# Patient Record
Sex: Female | Born: 1945 | Race: White | Hispanic: No | Smoking: Never smoker
Health system: Southern US, Community
[De-identification: ages and names within clinical notes are randomized; demographics above are authoritative.]

## PROBLEM LIST (undated history)

## (undated) DIAGNOSIS — M8888 Osteitis deformans of other bones: Secondary | ICD-10-CM

## (undated) DIAGNOSIS — G43909 Migraine, unspecified, not intractable, without status migrainosus: Secondary | ICD-10-CM

## (undated) DIAGNOSIS — K219 Gastro-esophageal reflux disease without esophagitis: Secondary | ICD-10-CM

## (undated) DIAGNOSIS — B3781 Candidal esophagitis: Secondary | ICD-10-CM

## (undated) DIAGNOSIS — Z8619 Personal history of other infectious and parasitic diseases: Secondary | ICD-10-CM

## (undated) DIAGNOSIS — K635 Polyp of colon: Secondary | ICD-10-CM

## (undated) DIAGNOSIS — Z8719 Personal history of other diseases of the digestive system: Secondary | ICD-10-CM

## (undated) DIAGNOSIS — T7840XA Allergy, unspecified, initial encounter: Secondary | ICD-10-CM

## (undated) DIAGNOSIS — E039 Hypothyroidism, unspecified: Secondary | ICD-10-CM

## (undated) DIAGNOSIS — E079 Disorder of thyroid, unspecified: Secondary | ICD-10-CM

## (undated) DIAGNOSIS — K5792 Diverticulitis of intestine, part unspecified, without perforation or abscess without bleeding: Secondary | ICD-10-CM

## (undated) DIAGNOSIS — I880 Nonspecific mesenteric lymphadenitis: Secondary | ICD-10-CM

## (undated) HISTORY — PX: COLONOSCOPY: SHX174

## (undated) HISTORY — DX: Personal history of other diseases of the digestive system: Z87.19

## (undated) HISTORY — DX: Allergy, unspecified, initial encounter: T78.40XA

## (undated) HISTORY — DX: Polyp of colon: K63.5

## (undated) HISTORY — DX: Migraine, unspecified, not intractable, without status migrainosus: G43.909

## (undated) HISTORY — PX: KIDNEY CYST REMOVAL: SHX684

## (undated) HISTORY — DX: Gastro-esophageal reflux disease without esophagitis: K21.9

## (undated) HISTORY — PX: BREAST CYST ASPIRATION: SHX578

## (undated) HISTORY — DX: Osteitis deformans of other bones: M88.88

## (undated) HISTORY — DX: Candidal esophagitis: B37.81

## (undated) HISTORY — DX: Personal history of other infectious and parasitic diseases: Z86.19

## (undated) HISTORY — DX: Diverticulitis of intestine, part unspecified, without perforation or abscess without bleeding: K57.92

## (undated) HISTORY — DX: Nonspecific mesenteric lymphadenitis: I88.0

## (undated) HISTORY — DX: Hypothyroidism, unspecified: E03.9

---
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
## 1974-07-19 HISTORY — PX: BREAST BIOPSY: SHX20

## 1995-07-20 DIAGNOSIS — E039 Hypothyroidism, unspecified: Secondary | ICD-10-CM | POA: Insufficient documentation

## 1998-07-19 HISTORY — PX: VAGINAL HYSTERECTOMY: SUR661

## 2003-07-20 HISTORY — PX: REDUCTION MAMMAPLASTY: SUR839

## 2013-07-19 HISTORY — PX: CHOLECYSTECTOMY: SHX55

## 2014-03-21 DIAGNOSIS — G500 Trigeminal neuralgia: Secondary | ICD-10-CM | POA: Insufficient documentation

## 2017-07-19 HISTORY — PX: CYST EXCISION: SHX5701

## 2019-07-14 ENCOUNTER — Other Ambulatory Visit: Payer: Self-pay

## 2019-07-14 ENCOUNTER — Ambulatory Visit (HOSPITAL_COMMUNITY)
Admission: EM | Admit: 2019-07-14 | Discharge: 2019-07-14 | Disposition: A | Discharge status: Home / Self Care | Payer: Medicare Other

## 2019-07-14 ENCOUNTER — Encounter (HOSPITAL_COMMUNITY): Payer: Self-pay | Admitting: Emergency Medicine

## 2019-07-14 DIAGNOSIS — B029 Zoster without complications: Secondary | ICD-10-CM

## 2019-07-14 HISTORY — DX: Disorder of thyroid, unspecified: E07.9

## 2019-07-14 MED ORDER — ACYCLOVIR 400 MG PO TABS: 800.0000 mg | ORAL_TABLET | Freq: Every day | ORAL | 0 refills | Status: AC

## 2019-07-14 MED ORDER — ONDANSETRON 4 MG PO TBDP: 4.0000 mg | ORAL_TABLET | Freq: Three times a day (TID) | ORAL | 0 refills | Status: DC | PRN

## 2019-07-14 MED ORDER — PREDNISONE 10 MG (21) PO TBPK: ORAL_TABLET | ORAL | 0 refills | Status: DC

## 2019-07-14 NOTE — Discharge Instructions (Signed)
Take the medication as prescribed °Follow up as needed for continued or worsening symptoms ° °

## 2019-07-14 NOTE — ED Triage Notes (Signed)
Pain on right side of head, neck and right mid back, pain started 8 weeks ago.  Seen by pcp, treated with antibiotics, no relief.  Wednesday morning started having pain in head and neck, shooting pain, jabbing pain under bra line on right.    Patient is visiting from [place]

## 2019-07-15 NOTE — ED Provider Notes (Signed)
[place]    CSN: [id number] Arrival date & time: 07/14/19  1344      History   Chief Complaint Chief Complaint  Patient presents with  . Appointment    2:00pm  . Neck Pain    HPI Marylen Zuk is a 73 y.o. female.   Patient is a 73 year old female that presents today with right sided head, neck,scalp,  mid upper back pain.  This has been present and worsening over the past 8 weeks.  She has been treated with 2 rounds of antibiotics and prednisone for "sinus infection" .  This is not helped at all.  4 days ago she started having sharp, stabbing pain in head and neck and under her bra line on the right side.  She does have a past medical history of shingles and has been under a lot of stress recently due to husband's 1 year death anniversary.  No recorded fevers.  No visible rashes that she has seen.  No falls or injuries.  No dizziness or blurred vision.  ROS per HPI    Neck Pain   Past Medical History:  Diagnosis Date  . Thyroid disease     There are no problems to display for this patient.   Past Surgical History:  Procedure Laterality Date  . ABDOMINAL HYSTERECTOMY    . CHOLECYSTECTOMY    . CYST EXCISION     kidney    OB History   No obstetric history on file.      Home Medications    Prior to Admission medications   Medication Sig Start Date End Date Taking? Authorizing Provider  levothyroxine (SYNTHROID) 25 MCG tablet Take 25 mcg by mouth daily before breakfast.   Yes [provider]  acyclovir (ZOVIRAX) 400 MG tablet Take 2 tablets (800 mg total) by mouth 5 (five) times daily for 7 days. 07/14/19 07/21/19  Loura Halt A, NP  ondansetron (ZOFRAN ODT) 4 MG disintegrating tablet Take 1 tablet (4 mg total) by mouth every 8 (eight) hours as needed for nausea or vomiting. 07/14/19   Loura Halt A, NP  predniSONE (STERAPRED UNI-PAK 21 TAB) 10 MG (21) TBPK tablet 6 tabs for 1 day, then 5 tabs for 1 das, then 4 tabs for 1 day, then 3 tabs  for 1 day, 2 tabs for 1 day, then 1 tab for 1 day 07/14/19   Orvan July, NP    Family History Family History  Problem Relation Age of Onset  . Lymphoma Father   . Cancer Brother     Social History Social History   Tobacco Use  . Smoking status: Never Smoker  Substance Use Topics  . Alcohol use: Not Currently  . Drug use: Never     Allergies   Sulfamethoxazole-trimethoprim   Review of Systems Review of Systems  Musculoskeletal: Positive for neck pain.     Physical Exam Triage Vital Signs ED Triage Vitals  Enc Vitals Group     BP 07/14/19 1429 (!) 142/91     Pulse Rate 07/14/19 1429 90     Resp 07/14/19 1429 16     Temp 07/14/19 1429 98.4 F (36.9 C)     Temp Source 07/14/19 1429 Oral     SpO2 07/14/19 1429 98 %     Weight --      Height --      Head Circumference --      Peak Flow --      Pain Score 07/14/19 1432  6     Pain Loc --      Pain Edu? --      Excl. in GC? --    No data found.  Updated Vital Signs BP (!) 142/91 (BP Location: Left Arm)   Pulse 90   Temp 98.4 F (36.9 C) (Oral)   Resp 16   SpO2 98%   Visual Acuity Right Eye Distance:   Left Eye Distance:   Bilateral Distance:    Right Eye Near:   Left Eye Near:    Bilateral Near:     Physical Exam Vitals and nursing note reviewed.  Constitutional:      General: She is not in acute distress.    Appearance: Normal appearance. She is not ill-appearing, toxic-appearing or diaphoretic.  HENT:     Head: Normocephalic.     Nose: Nose normal.     Mouth/Throat:     Pharynx: Oropharynx is clear.  Eyes:     Conjunctiva/sclera: Conjunctivae normal.  Pulmonary:     Effort: Pulmonary effort is normal.  Abdominal:     Palpations: Abdomen is soft.     Tenderness: There is no abdominal tenderness.  Musculoskeletal:        General: Normal range of motion.     Cervical back: Normal range of motion.  Skin:    General: Skin is warm and dry.     Findings: Rash present. Rash is crusting  and macular. Rash is not pustular or vesicular.          Comments: Rash to right posterior scalp and right upper back near her bra line. Some crusting in the scalp.   Neurological:     Mental Status: She is alert.  Psychiatric:        Mood and Affect: Mood normal.      UC Treatments / Results  Labs (all labs ordered are listed, but only abnormal results are displayed) Labs Reviewed - No data to display  EKG   Radiology No results found.  Procedures Procedures (including critical care time)  Medications Ordered in UC Medications - No data to display  Initial Impression / Assessment and Plan / UC Course  I have reviewed the triage vital signs and the nursing notes.  Pertinent labs & imaging results that were available during my care of the patient were reviewed by me and considered in my medical decision making (see chart for details).     Shingles-most likely diagnosis based on rash and symptoms Hx of same and pt has been under a lot of stress.  We will treat with acyclovir and prednisone. Zofran for nausea.  Follow up as needed for continued or worsening symptoms  Final Clinical Impressions(s) / UC Diagnoses   Final diagnoses:  Herpes zoster without complication     Discharge Instructions     Take the medication as prescribed Follow up as needed for continued or worsening symptoms    ED Prescriptions    Medication Sig Dispense Auth. Provider   acyclovir (ZOVIRAX) 400 MG tablet Take 2 tablets (800 mg total) by mouth 5 (five) times daily for 7 days. 70 tablet Freddi Schrager A, NP   predniSONE (STERAPRED UNI-PAK 21 TAB) 10 MG (21) TBPK tablet 6 tabs for 1 day, then 5 tabs for 1 das, then 4 tabs for 1 day, then 3 tabs for 1 day, 2 tabs for 1 day, then 1 tab for 1 day 21 tablet Sulema Braid A, NP   ondansetron (ZOFRAN ODT) 4 MG  disintegrating tablet Take 1 tablet (4 mg total) by mouth every 8 (eight) hours as needed for nausea or vomiting. 20 tablet Dahlia ByesBast, Daven Montz A,  NP     PDMP not reviewed this encounter.   Janace ArisBast, Andriea Hasegawa A, NP 07/15/19 1444

## 2019-11-21 ENCOUNTER — Telehealth: Payer: Self-pay | Admitting: Family Medicine

## 2019-11-21 MED ORDER — LEVOTHYROXINE SODIUM 25 MCG PO TABS: 25.0000 ug | ORAL_TABLET | Freq: Every day | ORAL | 4 refills | Status: DC

## 2019-11-21 NOTE — Telephone Encounter (Signed)
Rx done. 

## 2019-11-21 NOTE — Telephone Encounter (Signed)
I am ok with refill to get her through to new patient appointment.

## 2019-11-21 NOTE — Telephone Encounter (Signed)
Pt has a new pt appt on August 27th at 2:30 pm. Pt states she moved here recently and wants to be establish with [facility], however her old PCP would only fill her synthroid for this month and next but she will be out before she can see [facility]. Pt is wondering when it comes time for her next refill in July if [facility] will be able to fill it until she is able to be seen Aug 27th? Pt states this is the only medication she takes orally. Informed pt that I cannot guarantee that the PCP will be willing or able to do so but pt wanted me to send the message back to see if possible.  Medication:   synthroid 0.058mcg lowest dosage   Pharmacy: [facility] [address]: [phone]    Pt can be reached at [phone]

## 2020-03-14 ENCOUNTER — Ambulatory Visit (INDEPENDENT_AMBULATORY_CARE_PROVIDER_SITE_OTHER): Payer: Medicare PPO | Admitting: Family Medicine

## 2020-03-14 ENCOUNTER — Encounter: Payer: Self-pay | Admitting: Family Medicine

## 2020-03-14 ENCOUNTER — Other Ambulatory Visit: Payer: Self-pay

## 2020-03-14 VITALS — BP 100/78 | HR 78 | Temp 98.2°F | Ht 65.0 in | Wt 135.1 lb

## 2020-03-14 DIAGNOSIS — J329 Chronic sinusitis, unspecified: Secondary | ICD-10-CM

## 2020-03-14 DIAGNOSIS — G43809 Other migraine, not intractable, without status migrainosus: Secondary | ICD-10-CM

## 2020-03-14 DIAGNOSIS — E785 Hyperlipidemia, unspecified: Secondary | ICD-10-CM | POA: Diagnosis not present

## 2020-03-14 DIAGNOSIS — Z1231 Encounter for screening mammogram for malignant neoplasm of breast: Secondary | ICD-10-CM

## 2020-03-14 DIAGNOSIS — L989 Disorder of the skin and subcutaneous tissue, unspecified: Secondary | ICD-10-CM

## 2020-03-14 DIAGNOSIS — E2839 Other primary ovarian failure: Secondary | ICD-10-CM

## 2020-03-14 DIAGNOSIS — R197 Diarrhea, unspecified: Secondary | ICD-10-CM

## 2020-03-14 DIAGNOSIS — E538 Deficiency of other specified B group vitamins: Secondary | ICD-10-CM

## 2020-03-14 DIAGNOSIS — E039 Hypothyroidism, unspecified: Secondary | ICD-10-CM | POA: Diagnosis not present

## 2020-03-14 DIAGNOSIS — E559 Vitamin D deficiency, unspecified: Secondary | ICD-10-CM

## 2020-03-14 MED ORDER — UBRELVY 50 MG PO TABS: 50.0000 mg | ORAL_TABLET | Freq: Once | ORAL | 2 refills | Status: AC | PRN

## 2020-03-14 MED ORDER — PREDNISONE 20 MG PO TABS: ORAL_TABLET | ORAL | 0 refills | Status: DC

## 2020-03-14 NOTE — Progress Notes (Signed)
Alexa Rios DOB: 1945/08/26 Encounter date: 03/14/2020  This is a 74 y.o. female who presents to establish care. Chief Complaint  Patient presents with  . Establish Care    History of present illness: Husband passed in oct 2019; moved here to be near family.   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 Took gabapentin during that time. 100mg  capsule up to 2 capsules at bedtime. Saw dentist, saw ENT before getting diagnosis. Lasted 6-8 weeks. Usually managed on 200mg  qhs.   Also taking synthroid daily. Has been on this same dose for last ten years.   Has had complete hysterectomy at age 28. Had already gone through menopause and started having spotting. Gynecologist said there was breakthrough bleeding and also had cyst on ovary. Using estradiol 0.01%vag cream 42.5gm twice weekly.   Nystatin cream used for cracks in corners of mouth when they occur. 100,000 units 3-4 times daily prn.   Valtrex for fever blisters just if needed. Gets them 2-3 times/year.   Needs to establish with eye doc. Doesn't wear glasses, no cataracts. Wants to see derm. Wants ent referral - has a lot of sinus/allergy issues. Wants gastro.   Headaches as far back as she can remember in her family. Has migraines - can't take imitrex - gives her palpitations. Thrown up many times. Gets headaches regularly. True migraines once every couple of months. Worse after age 66. Knows when she is getting migraine because she gets different sensation in head. Last 2-3 hours up to 8 hours.   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 Saw doc at [facility] and they didn't think it was paniculitis. Dust from home renovations has aggravated sinuses. Ended up taking sudafed. Pharmacist suggested mucinex x 3 days. Really bothered her stomach. Since then has had diarrhea, burning in throat. 2 days ago started taking nexium to help.  Not helping yet. No blood in stools. No fevers. Drank pedialyte this morning.   mild osteopenia right hip on prior bone density.  Last physical was in 08/2019.    Past Medical History:  Diagnosis Date  . Diverticulitis   . GERD (gastroesophageal reflux disease)   . History of chicken pox   . Hypothyroidism   . Migraine   . Thyroid disease    Past Surgical History:  Procedure Laterality Date  . ABDOMINAL HYSTERECTOMY  2000   benign path  . BREAST BIOPSY Left 1976   benign per patient  . CHOLECYSTECTOMY  2015  . CYST EXCISION  2019   renal  . TONSILLECTOMY AND ADENOIDECTOMY  1951   Allergies  Allergen Reactions  . Sulfamethoxazole-Trimethoprim   . Sumatriptan     palpitations   Current Meds  Medication Sig  . BIOTIN 5000 PO Take by mouth.  09/2019 CALCIUM PO Take 1,200 mg by mouth daily.  2020 EVENING PRIMROSE OIL PO Take by mouth.  . Glucosamine-Chondroitin (COSAMIN DS PO) Take by mouth.  . levothyroxine (SYNTHROID) 25 MCG tablet Take 1 tablet (25 mcg total) by mouth daily before breakfast.  . Magnesium 200 MG TABS Take by mouth.  . Multiple Vitamin (MULTIVITAMIN ADULT PO) Take by mouth.  . Probiotic Product (PROBIOTIC PO) Take by mouth.  Marland Kitchen UBIQUINOL PO Take 50 mg by mouth daily.  . [DISCONTINUED] Estradiol 1 MG/GM GEL Place onto the skin once a week.   Social History   Tobacco Use  . Smoking status: Never Smoker  .  Smokeless tobacco: Never Used  Substance Use Topics  . Alcohol use: Not Currently   Family History  Problem Relation Age of Onset  . Rheum arthritis Mother   . Hyperlipidemia Mother   . Stroke Mother   . Lymphoma Father   . Diabetes Father   . Early death Father   . Hyperlipidemia Father   . Cancer Brother        ureteral  . Diabetes Brother   . Hyperlipidemia Brother   . Arthritis Sister   . Hyperlipidemia Sister   . Heart disease Maternal Grandmother   . High Cholesterol Maternal Grandmother   . Heart disease Paternal Grandmother   . High  Cholesterol Paternal Grandmother   . Prostate cancer Paternal Grandfather   . Stroke Paternal Grandfather      Review of Systems  Constitutional: Negative for chills, fatigue and fever.  HENT: Positive for congestion, sinus pressure and sore throat (post nasal drainage).   Respiratory: Negative for cough, chest tightness, shortness of breath and wheezing.   Cardiovascular: Negative for chest pain, palpitations and leg swelling.  Neurological: Positive for headaches.    Objective:  BP 100/78 (BP Location: Left Arm, Patient Position: Sitting, Cuff Size: Normal)   Pulse 78   Temp 98.2 F (36.8 C) (Oral)   Ht 5\' 5"  (1.651 m)   Wt 135 lb 1.6 oz (61.3 kg)   BMI 22.48 kg/m   Weight: 135 lb 1.6 oz (61.3 kg)   BP Readings from Last 3 Encounters:  03/14/20 100/78  07/14/19 (!) 142/91   Wt Readings from Last 3 Encounters:  03/14/20 135 lb 1.6 oz (61.3 kg)    Physical Exam Constitutional:      General: She is not in acute distress.    Appearance: She is well-developed.  HENT:     Right Ear: Tympanic membrane, ear canal and external ear normal.     Left Ear: Tympanic membrane, ear canal and external ear normal.     Nose: Septal deviation present.     Right Turbinates: Enlarged and swollen.     Left Turbinates: Enlarged and swollen.  Cardiovascular:     Rate and Rhythm: Normal rate and regular rhythm.     Heart sounds: Normal heart sounds. No murmur heard.  No friction rub.  Pulmonary:     Effort: Pulmonary effort is normal. No respiratory distress.     Breath sounds: Normal breath sounds. No wheezing or rales.  Abdominal:     General: Bowel sounds are normal.     Palpations: Abdomen is soft.  Musculoskeletal:     Right lower leg: No edema.     Left lower leg: No edema.  Neurological:     Mental Status: She is alert and oriented to person, place, and time.  Psychiatric:        Behavior: Behavior normal.     Assessment/Plan:  1. Chronic sinusitis, unspecified  location Increased pressure currently; has responded in past to prednisone; will give taper to help relieve current sx. Take with food to avoid stomach upset. - Ambulatory referral to ENT - Comprehensive metabolic panel; Future - Comprehensive metabolic panel  2. Other migraine without status migrainosus, not intractable Not tolerated sumatriptan in the past due to palpitations.  We will see if insurance will cover alternative medication for migraine abortant. - Ubrogepant (UBRELVY) 50 MG TABS; Take 50 mg by mouth once as needed for up to 1 dose.  Dispense: 30 tablet; Refill: 2  3. Hyperlipidemia - has  been diet controlled - Lipid panel; Future - Lipid panel  4. Hypothyroidism, unspecified type Continue with Synthroid.  Has been stable on 25 mcg dose for years. - TSH; Future - TSH  5. Diarrhea, unspecified type Chronic issue. - Ambulatory referral to Gastroenterology - CBC with Differential/Platelet; Future - CBC with Differential/Platelet  6. Vitamin B 12 deficiency - Vitamin B12; Future - Vitamin B12  7. Skin lesion Would like to follow with dermatology for regular skin checks. - Ambulatory referral to Dermatology  8. Encounter for screening mammogram for malignant neoplasm of breast - MM DIGITAL SCREENING BILATERAL; Future  9. Estrogen deficiency - DG Bone Density; Future  10. Vitamin D deficiency *check labs today.    Return for pending bloodwork. 25 minutes spent in total including exam, current condition/history reviewed, exam, charting.  Theodis Shove, MD

## 2020-03-14 NOTE — Patient Instructions (Addendum)
EYE: *Dr. Dione Booze: [phone] [address] [place] *Dr. Emily Filbert: [phone] (office just moved to [address], [place]) Dr. Fredrich Birks (OD) [facility]

## 2020-03-15 ENCOUNTER — Encounter: Payer: Self-pay | Admitting: Family Medicine

## 2020-03-15 ENCOUNTER — Other Ambulatory Visit: Payer: Self-pay | Admitting: Family Medicine

## 2020-03-15 DIAGNOSIS — G500 Trigeminal neuralgia: Secondary | ICD-10-CM | POA: Insufficient documentation

## 2020-03-15 DIAGNOSIS — J3089 Other allergic rhinitis: Secondary | ICD-10-CM | POA: Insufficient documentation

## 2020-03-15 DIAGNOSIS — G43109 Migraine with aura, not intractable, without status migrainosus: Secondary | ICD-10-CM | POA: Insufficient documentation

## 2020-03-15 DIAGNOSIS — K5790 Diverticulosis of intestine, part unspecified, without perforation or abscess without bleeding: Secondary | ICD-10-CM | POA: Insufficient documentation

## 2020-03-15 DIAGNOSIS — E039 Hypothyroidism, unspecified: Secondary | ICD-10-CM | POA: Insufficient documentation

## 2020-03-15 DIAGNOSIS — R12 Heartburn: Secondary | ICD-10-CM | POA: Insufficient documentation

## 2020-03-15 DIAGNOSIS — M5481 Occipital neuralgia: Secondary | ICD-10-CM | POA: Insufficient documentation

## 2020-03-15 LAB — CBC WITH DIFFERENTIAL/PLATELET
Absolute Monocytes: 554 cells/uL (ref 200–950)
Basophils Absolute: 32 cells/uL (ref 0–200)
Basophils Relative: 0.5 %
Eosinophils Absolute: 69 cells/uL (ref 15–500)
Eosinophils Relative: 1.1 %
HCT: 37.4 % (ref 35.0–45.0)
Hemoglobin: 12.8 g/dL (ref 11.7–15.5)
Lymphs Abs: 1852 cells/uL (ref 850–3900)
MCH: 32.5 pg (ref 27.0–33.0)
MCHC: 34.2 g/dL (ref 32.0–36.0)
MCV: 94.9 fL (ref 80.0–100.0)
MPV: 10.8 fL (ref 7.5–12.5)
Monocytes Relative: 8.8 %
Neutro Abs: 3793 cells/uL (ref 1500–7800)
Neutrophils Relative %: 60.2 %
Platelets: 247 10*3/uL (ref 140–400)
RBC: 3.94 10*6/uL (ref 3.80–5.10)
RDW: 11.6 % (ref 11.0–15.0)
Total Lymphocyte: 29.4 %
WBC: 6.3 10*3/uL (ref 3.8–10.8)

## 2020-03-15 LAB — COMPREHENSIVE METABOLIC PANEL
AG Ratio: 1.8 (calc) (ref 1.0–2.5)
ALT: 21 U/L (ref 6–29)
AST: 26 U/L (ref 10–35)
Albumin: 4.3 g/dL (ref 3.6–5.1)
Alkaline phosphatase (APISO): 88 U/L (ref 37–153)
BUN: 12 mg/dL (ref 7–25)
CO2: 27 mmol/L (ref 20–32)
Calcium: 9.1 mg/dL (ref 8.6–10.4)
Chloride: 101 mmol/L (ref 98–110)
Creat: 0.69 mg/dL (ref 0.60–0.93)
Globulin: 2.4 g/dL (calc) (ref 1.9–3.7)
Glucose, Bld: 81 mg/dL (ref 65–99)
Potassium: 4.5 mmol/L (ref 3.5–5.3)
Sodium: 137 mmol/L (ref 135–146)
Total Bilirubin: 0.4 mg/dL (ref 0.2–1.2)
Total Protein: 6.7 g/dL (ref 6.1–8.1)

## 2020-03-15 LAB — LIPID PANEL
Cholesterol: 192 mg/dL (ref <200)
HDL: 78 mg/dL (ref >=50)
LDL Cholesterol (Calc): 97 mg/dL (calc)
Non-HDL Cholesterol (Calc): 114 mg/dL (calc) (ref <130)
Total CHOL/HDL Ratio: 2.5 (calc) (ref <5.0)
Triglycerides: 79 mg/dL (ref <150)

## 2020-03-15 LAB — VITAMIN B12: Vitamin B-12: 439 pg/mL (ref 200–1100)

## 2020-03-15 LAB — VITAMIN D 25 HYDROXY (VIT D DEFICIENCY, FRACTURES): Vit D, 25-Hydroxy: 39 ng/mL (ref 30–100)

## 2020-03-15 LAB — TSH: TSH: 2.66 mIU/L (ref 0.40–4.50)

## 2020-03-15 MED ORDER — ESTRADIOL 1 MG/GM TD GEL: 1.0000 mg | TRANSDERMAL | Status: DC

## 2020-03-19 ENCOUNTER — Telehealth: Payer: Self-pay | Admitting: *Deleted

## 2020-03-19 NOTE — Telephone Encounter (Signed)
Prior auth for Ubrelvy 50mg  tablets sent to [email or web address].

## 2020-03-25 ENCOUNTER — Telehealth: Payer: Self-pay | Admitting: Family Medicine

## 2020-03-25 NOTE — Telephone Encounter (Signed)
Pt is calling in needing a refill of gabapentin 100 MG   Pharm:  [facility] [address]

## 2020-03-26 ENCOUNTER — Other Ambulatory Visit: Payer: Self-pay | Admitting: Family Medicine

## 2020-03-26 MED ORDER — GABAPENTIN 100 MG PO CAPS: 200.0000 mg | ORAL_CAPSULE | Freq: Every day | ORAL | 1 refills | Status: DC

## 2020-03-26 NOTE — Telephone Encounter (Signed)
Refill was sent. (This was discussed at visit but didn't make it to med list so I have added)

## 2020-03-26 NOTE — Telephone Encounter (Signed)
Noted  

## 2020-03-27 NOTE — Telephone Encounter (Signed)
Dr Hassan Rowan received a fax stating the Rx was not covered as the pt needs to try two or more medications.  PCP stated the pt is allergic to Sumatriptan so another triptan cannot be given and requested an appeal be sent.  Appeal form was completed with this information and faxed to [facility] at  [phone].

## 2020-03-31 ENCOUNTER — Telehealth: Payer: Self-pay | Admitting: Family Medicine

## 2020-03-31 NOTE — Telephone Encounter (Signed)
error 

## 2020-03-31 NOTE — Telephone Encounter (Signed)
Alexa Rios is calling to say they faxed over clinical records on 09/11. They need additional information for the pt to finish up the appeal.  Please advise

## 2020-04-08 ENCOUNTER — Telehealth: Payer: Self-pay | Admitting: Family Medicine

## 2020-04-08 NOTE — Telephone Encounter (Signed)
Pt call and stated she need a refill on Estradiol 1 MG/GM GEL she stated she need it now and pharmacy said it should be 1 mg ask needed and not 1 mg twice a wk . She stated the Insurance want pay. Pt want a call back.

## 2020-04-09 ENCOUNTER — Other Ambulatory Visit: Payer: Self-pay | Admitting: Family Medicine

## 2020-04-09 MED ORDER — ESTRADIOL 1 MG/GM TD GEL: TRANSDERMAL | 0 refills | Status: AC

## 2020-04-09 NOTE — Telephone Encounter (Signed)
Spoke with the pt and informed her of the message below.  Patient stated she used this twice a week, requested an early refill as the top of her tube was dry and she only has enough for one more dose and did not want to wait another 3 weeks.  Patient is aware the Rx was sent to the pharmacy with as needed added to sigs.

## 2020-04-09 NOTE — Telephone Encounter (Signed)
Ok to send refill, but uncertain why insurance won't pay with it written as twice weekly, since that is how it is approved for use. We typically cannot just say as needed, and have to be more specific? Please see how frequently she would like to use it and maybe we can be more specific about timing and then add as needed (ie three times weekly (typically max) as needed). Let me know if further questions.

## 2020-04-10 ENCOUNTER — Encounter: Payer: Self-pay | Admitting: Gastroenterology

## 2020-04-11 ENCOUNTER — Other Ambulatory Visit: Payer: Self-pay | Admitting: Otolaryngology

## 2020-04-11 ENCOUNTER — Other Ambulatory Visit: Payer: Self-pay

## 2020-04-11 ENCOUNTER — Ambulatory Visit
Admission: RE | Admit: 2020-04-11 | Discharge: 2020-04-11 | Disposition: A | Discharge status: Home / Self Care | Payer: Medicare PPO | Source: Ambulatory Visit | Attending: Family Medicine | Admitting: Family Medicine

## 2020-04-11 DIAGNOSIS — J329 Chronic sinusitis, unspecified: Secondary | ICD-10-CM

## 2020-04-11 DIAGNOSIS — Z1231 Encounter for screening mammogram for malignant neoplasm of breast: Secondary | ICD-10-CM

## 2020-04-11 NOTE — Telephone Encounter (Signed)
On the records they sent over (if for ubrelvy?) it looked like appeal was approved and she could get medication? I had given these back to you as I was not sure what was needed. If you don't have forms, then please ask for them to resend. I'm not sure what else they need and I don't have paperwork now.

## 2020-04-11 NOTE — Telephone Encounter (Signed)
Approval was faxed, sent to be scanned, pt is aware and she stated she picked up the Rx.

## 2020-04-15 ENCOUNTER — Telehealth: Payer: Self-pay | Admitting: *Deleted

## 2020-04-15 NOTE — Telephone Encounter (Signed)
Patient called and wanted to know the name of the podiatrist Dr Hassan Rowan recommended for acid treatment on her foot.  Patient requests a return call at [phone].  Message sent to PCP.

## 2020-04-16 NOTE — Telephone Encounter (Signed)
Dr. Ovid Curd  [facility]: [address] [phone]   Ok to put in referral if desired

## 2020-04-17 NOTE — Telephone Encounter (Signed)
Spoke with the pt and informed her of the information below as she stated she would prefer to call for the appt.

## 2020-04-24 ENCOUNTER — Ambulatory Visit
Admission: RE | Admit: 2020-04-24 | Discharge: 2020-04-24 | Disposition: A | Discharge status: Home / Self Care | Payer: Medicare PPO | Source: Ambulatory Visit | Attending: Otolaryngology | Admitting: Otolaryngology

## 2020-04-24 ENCOUNTER — Other Ambulatory Visit: Payer: Medicare PPO

## 2020-04-24 ENCOUNTER — Other Ambulatory Visit: Payer: Self-pay

## 2020-04-24 DIAGNOSIS — E1165 Type 2 diabetes mellitus with hyperglycemia: Secondary | ICD-10-CM

## 2020-04-24 DIAGNOSIS — J329 Chronic sinusitis, unspecified: Secondary | ICD-10-CM

## 2020-04-24 MED ORDER — FREESTYLE LIBRE 14 DAY READER DEVI: 5 refills | Status: DC

## 2020-04-24 NOTE — Progress Notes (Signed)
Libre glucose monitor to check sugars bid and prn for DM with hyperglycemia Change every 14 days   - per provider

## 2020-04-29 ENCOUNTER — Telehealth: Payer: Self-pay | Admitting: Family Medicine

## 2020-04-29 NOTE — Telephone Encounter (Signed)
Pt has has gastritis and reflux for about 3 weeks.  Pt has been taking Nexium and OTC gas products for 2 weeks-- gagging on the acid.  Pt is wondering what Dr Dorita Fray recommendation is.  Has an appt with Gastroenterologist on 11/30.

## 2020-04-30 NOTE — Telephone Encounter (Signed)
If [facility] available for virtual to discuss options that would be best. I know I don't have anything today. In general, elevating head of bed, cutting out acidic foods, nexium (can take 40mg ) 30 minutes prior to dinner and then add on pepcid morning and bedtime.

## 2020-04-30 NOTE — Telephone Encounter (Signed)
Spoke with the pt and informed her of the message below.  Appt scheduled with Dr Selena Batten at [facility] on 10/14.

## 2020-05-01 ENCOUNTER — Telehealth: Payer: Medicare PPO | Admitting: Family Medicine

## 2020-05-05 ENCOUNTER — Encounter: Payer: Self-pay | Admitting: Internal Medicine

## 2020-05-05 ENCOUNTER — Ambulatory Visit: Payer: Medicare PPO | Admitting: Internal Medicine

## 2020-05-05 VITALS — BP 110/78 | HR 88 | Ht 63.5 in | Wt 136.0 lb

## 2020-05-05 DIAGNOSIS — Z8619 Personal history of other infectious and parasitic diseases: Secondary | ICD-10-CM

## 2020-05-05 DIAGNOSIS — Z92241 Personal history of systemic steroid therapy: Secondary | ICD-10-CM

## 2020-05-05 DIAGNOSIS — R1310 Dysphagia, unspecified: Secondary | ICD-10-CM

## 2020-05-05 DIAGNOSIS — R12 Heartburn: Secondary | ICD-10-CM | POA: Diagnosis not present

## 2020-05-05 MED ORDER — FLUCONAZOLE 100 MG PO TABS: ORAL_TABLET | ORAL | 0 refills | Status: DC

## 2020-05-05 NOTE — Patient Instructions (Addendum)
We are going to obtain your records from [facility] for review.  We have sent the following medications to your pharmacy for you to pick up at your convenience: Diflucan  Please come back and see [place] on   I appreciate the opportunity to care for you. Stan Head, MD, [facility]

## 2020-05-05 NOTE — Progress Notes (Signed)
Alexa Rios 74 y.o. 1945/10/24 [id number]  Assessment & Plan:   Encounter Diagnoses  Name Primary?  Marland Kitchen Dysphagia, unspecified type Yes  . Heartburn   . History of candidiasis - esophagus   . History of steroid therapy    She has a mild dysphagia, and a lot of symptoms that are reminiscent of prior esophageal candidiasis she thinks.  Given that she had an EGD in April in [place] I do not feel compelled to repeat an upper endoscopy at this time.  She and I have decided empiric treatment with fluconazole given her onset of symptoms after antibiotics and prednisone therapy which certainly could lead to esophageal candidiasis.  I will review records from [facility] and follow-up with her she knows to contact me if this treatment is not successful.  We have planned a routine follow-up for December 6 as well.  I appreciate the opportunity to care for this patient. CC: Wynn Banker, MD   Subjective:   Chief Complaint: Heartburn abdominal discomfort gas bitter taste in mouth  HPI Alexa Rios is a 74 year old widowed white woman who recently moved to [facility] from the flat [facility] area and has been suffering with mild dysphagia (things move through slowly) burning chest and upper abdominal pain and a bitter taste in her mouth.  It is quite bothersome.  She has a  history of GI disturbances over the years and has followed with gastroenterologist wherever she has lived.  Most recently that was at [facility] in [place] and she had an EGD with esophageal dilation though no dysphagia reported in April just prior to moving here in May.  We do not have those records but we have requested them.  She moved here because she lost her husband in the last year or so and she decided to move closer to family.  Her son lives in this area.  She was having some renovations done in her recently purchased house and it was dusty and  she developed sinus problems and she had 2 rounds of antibiotics and 3 rounds of prednisone this summer.  After that she started to have gas, abdominal distention heartburn and epigastric discomfort.  She has tried Nexium and added Pepcid with some help and is a little bit better.  She went to the urgent care recently and a GI cocktail did soothe things for about a day.  However she still does not feel right and says this reminds her when she was diagnosed with esophageal candidiasis about a year or 2 ago.  She also had a flare of her trigeminal neuralgia during this time.  And took her gabapentin which may have upset her stomach some as well.  She takes MiraLAX daily to treat constipation with success overall.  A 2016 colonoscopy was okay she says she had a small polyp at age 26 on her first colonoscopy but none since.  She has a history of possible mesenteric adenitis and was referred to [facility] but was told she did not have that.  In any rate she has some chronic gastrointestinal symptoms off and on but this flare she described above is severe and has made it quite difficult to sleep at times and has been very bothersome though she does report she is about 10 or 20% improved at this point from the onset in the last month or so.  GI review of systems is otherwise negative other than also having some nausea symptoms.  She also  has a mild dry cough she reports. Allergies  Allergen Reactions  . Sulfamethoxazole-Trimethoprim   . Sumatriptan     palpitations   Current Meds  Medication Sig  . BIOTIN 5000 PO Take 1 tablet by mouth in the morning and at bedtime.   Marland Kitchen CALCIUM PO Take 1,200 mg by mouth daily.  . Continuous Blood Gluc Receiver (FREESTYLE LIBRE 14 DAY READER) DEVI Libre glucose monitor to check sugars bid and prn for DM with hyperglycemia  . Esomeprazole Magnesium (NEXIUM 24HR) 20 MG TBEC Take 1 tablet by mouth daily.  . Estradiol 1 MG/GM GEL Apply 1mg  twice a week as needed  . EVENING PRIMROSE  OIL PO Take 500 mg by mouth daily.   . Famotidine (PEPCID AC PO) Take 1 tablet by mouth daily.  gabapentin (NEURONTIN) 100 MG capsule Take 2 capsules (200 mg total) by mouth at bedtime. (Patient taking differently: Take 100 mg by mouth as needed. )  . Glucosamine-Chondroitin (COSAMIN DS PO) Take by mouth.  . levothyroxine (SYNTHROID) 25 MCG tablet Take 1 tablet (25 mcg total) by mouth daily before breakfast.  . Magnesium 200 MG TABS Take by mouth.  . Multiple Vitamin (MULTIVITAMIN ADULT PO) Take by mouth.  . Potassium 99 MG TABS Take 1 tablet by mouth daily.  . Probiotic Product (PROBIOTIC PO) Take by mouth.  . Simethicone (GAS-X PO) Take 1 tablet by mouth daily.  Marland Kitchen UBIQUINOL PO Take 50 mg by mouth daily.   Past Medical History:  Diagnosis Date  . Colon polyps   . Diverticulitis   . GERD (gastroesophageal reflux disease)   . History of chicken pox   . Hypothyroidism   . Mesenteric adenitis   . Migraine   . Status post dilation of esophageal narrowing   . Thyroid disease    Past Surgical History:  Procedure Laterality Date  . BREAST BIOPSY Left 1976   benign per patient  . BREAST CYST ASPIRATION Right    30 + yrs ago  . CHOLECYSTECTOMY  2015  . COLONOSCOPY    . CYST EXCISION  2019   renal  . REDUCTION MAMMAPLASTY Bilateral 2005  . TONSILLECTOMY AND ADENOIDECTOMY  1951  . VAGINAL HYSTERECTOMY  2000   benign path   Social History   Social History Narrative   Patient is widowed she is a retired 2006 and 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   Moved to [place] from [place] in May 2021   No alcohol   1 caffeinated beverage daily   Never smoker no drugs no other tobacco   family history includes Arthritis in her sister; Cancer in her brother; Diabetes in her brother and father; Early death in her father; Heart disease in her maternal grandmother and paternal grandmother; High Cholesterol in her maternal grandmother and paternal grandmother; Hyperlipidemia in her brother,  father, mother, and sister; Lymphoma in her father; Prostate cancer in her paternal grandfather; Rheum arthritis in her mother; Stroke in her mother and paternal grandfather.   Review of Systems As per HPI she has some hearing issues night sweats a slight cough lately and some headache issues at times.  All other review systems are negative.  Objective:   Physical Exam BP 110/78 (BP Location: Left Arm, Patient Position: Sitting, Cuff Size: Normal)   Pulse 88   Ht 5' 3.5" (1.613 m) Comment: height measured without shoes  Wt 136 lb (61.7 kg)   BMI 23.71 kg/m  Petite well-developed well-nourished white woman in no acute  distress Eyes are anicteric Mouth and posterior pharynx are free of lesions there is no thrush Lungs are clear Heart sounds are normal The abdomen is soft nontender no organomegaly or mass bowel sounds are present Appropriate mood and affect Alert and oriented x3

## 2020-05-06 ENCOUNTER — Telehealth: Payer: Self-pay | Admitting: Gastroenterology

## 2020-05-06 NOTE — Telephone Encounter (Signed)
Pt called Painful swallowing There are burning sensation in the chest.  Not spitting up.  No clinical signs or symptoms of food impaction. Took 2 doses of Diflucan.  Wants to EGD done as soon as possible as she is "miserable"  I have reviewed clinic note  from yesterday.  I also discussed with the patient in detail.  Advised her to start taking Mylanta before each meals. Can proceed with EGD with possible dil if needed (If OK with Jalene Mullet, If OK with Baldo Ash, Can set her for EGD with dil on 10/21 @ 3:30 PM at [facility]. Pl let pt know (if it is a go) RG

## 2020-05-07 ENCOUNTER — Other Ambulatory Visit: Payer: Self-pay | Admitting: Gastroenterology

## 2020-05-07 DIAGNOSIS — R1310 Dysphagia, unspecified: Secondary | ICD-10-CM

## 2020-05-07 DIAGNOSIS — Z8619 Personal history of other infectious and parasitic diseases: Secondary | ICD-10-CM

## 2020-05-07 NOTE — Telephone Encounter (Signed)
Spoke to patient this morning. She is scheduled for EGD with possible dilation 05/08/20 at 3:30 pm. We went over the instructions over the phone. She will also pick them up today at the [facility] office today as she will be up there to drop off records.  Thanks, [employer]

## 2020-05-07 NOTE — Telephone Encounter (Signed)
Please scope her  Thanks!

## 2020-05-08 ENCOUNTER — Ambulatory Visit: Payer: Medicare PPO | Admitting: Podiatry

## 2020-05-08 ENCOUNTER — Encounter: Payer: Self-pay | Admitting: Gastroenterology

## 2020-05-08 ENCOUNTER — Ambulatory Visit (AMBULATORY_SURGERY_CENTER): Payer: Medicare PPO | Admitting: Gastroenterology

## 2020-05-08 ENCOUNTER — Other Ambulatory Visit: Payer: Self-pay

## 2020-05-08 VITALS — BP 115/68 | HR 74 | Temp 96.6°F | Resp 12 | Ht 63.5 in | Wt 136.0 lb

## 2020-05-08 DIAGNOSIS — R1310 Dysphagia, unspecified: Secondary | ICD-10-CM | POA: Diagnosis not present

## 2020-05-08 DIAGNOSIS — K219 Gastro-esophageal reflux disease without esophagitis: Secondary | ICD-10-CM

## 2020-05-08 DIAGNOSIS — K449 Diaphragmatic hernia without obstruction or gangrene: Secondary | ICD-10-CM | POA: Diagnosis not present

## 2020-05-08 DIAGNOSIS — K2970 Gastritis, unspecified, without bleeding: Secondary | ICD-10-CM | POA: Diagnosis not present

## 2020-05-08 DIAGNOSIS — K2950 Unspecified chronic gastritis without bleeding: Secondary | ICD-10-CM | POA: Diagnosis not present

## 2020-05-08 MED ORDER — SODIUM CHLORIDE 0.9 % IV SOLN: 500.0000 mL | Freq: Once | INTRAVENOUS | Status: DC

## 2020-05-08 MED ORDER — FAMOTIDINE 20 MG PO TABS: 20.0000 mg | ORAL_TABLET | Freq: Every day | ORAL | 11 refills | Status: DC

## 2020-05-08 NOTE — Op Note (Signed)
[facility] Patient Name: Alexa Rios Procedure Date: 05/08/2020 2:58 PM MRN: [id number] Endoscopist: Lynann Bologna , MD Age: 74 Referring MD:  Date of Birth: 1946/05/25 Gender: Female Account #: [id number] Procedure:                Upper GI endoscopy Indications:              Dysphagia, GERD Medicines:                Monitored Anesthesia Care Procedure:                Pre-Anesthesia Assessment:                           - Prior to the procedure, a History and Physical                            was performed, and patient medications and                            allergies were reviewed. The patient's tolerance of                            previous anesthesia was also reviewed. The risks                            and benefits of the procedure and the sedation                            options and risks were discussed with the patient.                            All questions were answered, and informed consent                            was obtained. Prior Anticoagulants: The patient has                            taken no previous anticoagulant or antiplatelet                            agents. ASA Grade Assessment: II - A patient with                            mild systemic disease. After reviewing the risks                            and benefits, the patient was deemed in                            satisfactory condition to undergo the procedure.                           After obtaining informed consent, the endoscope was  passed under direct vision. Throughout the                            procedure, the patient's blood pressure, pulse, and                            oxygen saturations were monitored continuously. The                            Endoscope was introduced through the mouth, and                            advanced to the second part of duodenum. The upper                            GI endoscopy was accomplished without  difficulty.                            The patient tolerated the procedure well. Scope In: Scope Out: Findings:                 The distal esophagus was mildly tortuous but                            normal. Z-line was well-defined at 36 cm. Examined                            by NBI. Biopsies were obtained from the proximal,                            mid and distal esophagus with cold forceps for                            histology to r/o eosinophilic esophagitis. The                            scope was withdrawn. Dilation was performed with a                            Maloney dilator with mild resistance at 50 Fr and                            52 Fr.                           A small 2 cm hiatal hernia was present.                           The entire examined stomach was normal. Biopsies                            were taken with a cold forceps for histology from  antrum and body of the stomach.                           A 10 mm non-bleeding diverticulum was found in the                            second portion of the duodenum. Major papilla                            opened at the lower lip of the diverticulum. The                            remaining second portion of the duodenum was                            normal. Biopsies for histology were taken with a                            cold forceps to r/o celiac disease. Complications:            No immediate complications. Estimated Blood Loss:     Estimated blood loss: none. Impression:               - Mild presbyesophagus s/p esophageal dilatation.                           - Small hiatal hernia.                           - Incidental duodenal diverticulum. Recommendation:           - Patient has a contact number available for                            emergencies. The signs and symptoms of potential                            delayed complications were discussed with the                             patient. Return to normal activities tomorrow.                            Written discharge instructions were provided to the                            patient.                           - Post dilatation diet.                           - Continue Nexium 40 mg p.o. every morning 1/2hr                            before breakfast. Add Pepcid 20 mg  p.o. nightly.                           - Stop taking supplements including magnesium                            supplements x 2 weeks.                           - Can stop Diflucan as there was no evidence of                            Candida esophagitis.                           - Await pathology results.                           - The findings and recommendations were discussed                            with the patient's son Jillyn Hidden.                           - I do believe that there is some assoc esophageal                            hypersensitivity. If she still has problems, can                            consider trial of low-dose amitriptyline.                           - If still with problems, consider                           esophageal manometry/24-hour pH study.                           - FU with Dr Sharyl Nimrod clinic in 6-8 weeks. Lynann Bologna, MD 05/08/2020 3:49:17 PM This report has been signed electronically.

## 2020-05-08 NOTE — Progress Notes (Signed)
Called to room to assist during endoscopic procedure.  Patient ID and intended procedure confirmed with present staff. Received instructions for my participation in the procedure from the performing physician.  

## 2020-05-08 NOTE — Progress Notes (Signed)
A and O x3. Report to RN. Tolerated MAC anesthesia well.Teeth unchanged after procedure.

## 2020-05-08 NOTE — Patient Instructions (Addendum)
YOU HAD AN ENDOSCOPIC PROCEDURE TODAY AT THE [facility]:   Refer to the procedure report that was given to you for any specific questions about what was found during the examination.  If the procedure report does not answer your questions, please call your gastroenterologist to clarify.  If you requested that your care partner not be given the details of your procedure findings, then the procedure report has been included in a sealed envelope for you to review at your convenience later.  YOU SHOULD EXPECT: Some feelings of bloating in the abdomen. Passage of more gas than usual.  Walking can help get rid of the air that was put into your GI tract during the procedure and reduce the bloating. If you had a lower endoscopy (such as a colonoscopy or flexible sigmoidoscopy) you may notice spotting of blood in your stool or on the toilet paper. If you underwent a bowel prep for your procedure, you may not have a normal bowel movement for a few days.  Please Note:  You might notice some irritation and congestion in your nose or some drainage.  This is from the oxygen used during your procedure.  There is no need for concern and it should clear up in a day or so.  SYMPTOMS TO REPORT IMMEDIATELY:     Following upper endoscopy (EGD)  Vomiting of blood or coffee ground material  New chest pain or pain under the shoulder blades  Painful or persistently difficult swallowing  New shortness of breath  Fever of 100F or higher  Black, tarry-looking stools  For urgent or emergent issues, a gastroenterologist can be reached at any hour by calling [phone]. Do not use MyChart messaging for urgent concerns.    DIET:  Follow a Post-Dilation Diet (see handout given to you by your recovery nurse): Nothing by mouth until 4:30pm, Clear Liquids only from 4:30pm to 5:30pm, then you may have a Soft diet for the rest of the day today starting at 5:30pm. You may may proceed to your regular diet as  tolerated tomorrow morning.  Drink plenty of fluids but you should avoid alcoholic beverages for 24 hours.  MEDICATIONS: Continue Nexium 40 mg by mouth every morning 30 minutes before breakfast. Add Pepcid 20 mg by mouth nightly. Stop taking supplements, including magnesium supplements X 2 weeks. You may Stop Diflucan as there was no evidence of Candida esophagitis.  Please see handouts given to you by your recovery nurse.  Follow up: If you are still having problems, can consider a trial of low-dose amitriptyline. Also, may consider esophageal manometry/24-hour pH study.  Follow up with Dr. Sharyl Nimrod clinic in 6-8 weeks.    ACTIVITY:  You should plan to take it easy for the rest of today and you should NOT DRIVE or use heavy machinery until tomorrow (because of the sedation medicines used during the test).    FOLLOW UP: Our staff will call the number listed on your records 48-72 hours following your procedure to check on you and address any questions or concerns that you may have regarding the information given to you following your procedure. If we do not reach you, we will leave a message.  We will attempt to reach you two times.  During this call, we will ask if you have developed any symptoms of COVID 19. If you develop any symptoms (ie: fever, flu-like symptoms, shortness of breath, cough etc.) before then, please call [phone].  If you test positive for Covid 19 in  the 2 weeks post procedure, please call and report this information to [place].    If any biopsies were taken you will be contacted by phone or by letter within the next 1-3 weeks.  Please call us at [phone] if you have not heard about the biopsies in 3 weeks.   Thank you for allowing [place] to provide for your healthcare needs today.   SIGNATURES/CONFIDENTIALITY: You and/or your care partner have signed paperwork which will be entered into your electronic medical record.  These signatures attest to the fact that that  the information above on your After Visit Summary has been reviewed and is understood.  Full responsibility of the confidentiality of this discharge information lies with you and/or your care-partner.

## 2020-05-12 ENCOUNTER — Telehealth: Payer: Self-pay

## 2020-05-12 NOTE — Telephone Encounter (Signed)
  Follow up Call-  Call back number 05/08/2020  Post procedure Call Back phone  # [phone]  Permission to leave phone message Yes     Patient questions:  Do you have a fever, pain , or abdominal swelling? No. Pain Score  0 *  Have you tolerated food without any problems? Yes.    Have you been able to return to your normal activities? Yes.    Do you have any questions about your discharge instructions: Diet   No. Medications  No. Follow up visit  No.  Do you have questions or concerns about your Care? No.  Actions: * If pain score is 4 or above: No action needed, pain <4. 1. Have you developed a fever since your procedure? no  2.   Have you had an respiratory symptoms (SOB or cough) since your procedure? no  3.   Have you tested positive for COVID 19 since your procedure no  4.   Have you had any family members/close contacts diagnosed with the COVID 19 since your procedure?  no   If yes to any of these questions please route to Laverna Peace, RN and Karlton Lemon, RN

## 2020-05-14 ENCOUNTER — Encounter: Payer: Self-pay | Admitting: Gastroenterology

## 2020-06-17 ENCOUNTER — Ambulatory Visit: Payer: Medicare PPO | Admitting: Gastroenterology

## 2020-06-23 ENCOUNTER — Encounter: Payer: Self-pay | Admitting: Internal Medicine

## 2020-06-23 ENCOUNTER — Ambulatory Visit: Payer: Medicare PPO | Admitting: Internal Medicine

## 2020-06-23 VITALS — BP 110/60 | HR 78 | Ht 63.5 in | Wt 137.0 lb

## 2020-06-23 DIAGNOSIS — K219 Gastro-esophageal reflux disease without esophagitis: Secondary | ICD-10-CM | POA: Diagnosis not present

## 2020-06-23 DIAGNOSIS — R1310 Dysphagia, unspecified: Secondary | ICD-10-CM

## 2020-06-23 NOTE — Patient Instructions (Signed)
Stay on your current medicines and come back and see [place] as needed.  Normal BMI (Body Mass Index- based on height and weight) is between 23 and 30. Your BMI today is Body mass index is 23.89 kg/m. Marland Kitchen Please consider follow up  regarding your BMI with your Primary Care Provider.  I appreciate the opportunity to care for you. Stan Head, MD, [facility]
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
## 2020-07-07 ENCOUNTER — Other Ambulatory Visit: Payer: Medicare PPO

## 2020-07-28 ENCOUNTER — Telehealth: Payer: Self-pay | Admitting: Internal Medicine

## 2020-07-28 NOTE — Telephone Encounter (Signed)
Alexa Rios said constipation has been a life long issue. She thinks she had an allergic reaction to the Metamucil. Within 30 minutes of taking it she got itchy and hives, had mucous coming out of her nose, and her eyes were on fire. She has taken the metamucil for a long time and wonders if she has built up some sort of allergic reaction to it. She stopped it and re-tried Citrucel. Did not help. She took MOM this AM which helped alittle but she said she heard that it is not safe to take all the time. She wonders if she could try Linzess Sir?

## 2020-07-28 NOTE — Telephone Encounter (Signed)
I do not think Linzess will be covered on her insurance  I would prefer at any rate that she try taking MiraLAX daily and schedule a visit to see me next available

## 2020-07-28 NOTE — Telephone Encounter (Signed)
You had a cancellation on1/13/2022 so she will come then. She said to tell you it wasn't Metamucil it was Miralax she had the reaction too. It was the [employer] generic brand. She is going to use her MOM until she see's you on Thursday.

## 2020-07-31 ENCOUNTER — Ambulatory Visit: Payer: Medicare PPO | Admitting: Internal Medicine

## 2020-07-31 ENCOUNTER — Encounter: Payer: Self-pay | Admitting: Internal Medicine

## 2020-07-31 VITALS — BP 104/70 | HR 90 | Wt 138.0 lb

## 2020-07-31 DIAGNOSIS — T50905A Adverse effect of unspecified drugs, medicaments and biological substances, initial encounter: Secondary | ICD-10-CM | POA: Diagnosis not present

## 2020-07-31 DIAGNOSIS — K624 Stenosis of anus and rectum: Secondary | ICD-10-CM | POA: Diagnosis not present

## 2020-07-31 DIAGNOSIS — K5909 Other constipation: Secondary | ICD-10-CM | POA: Insufficient documentation

## 2020-07-31 NOTE — Patient Instructions (Signed)
We are providing you with information to read on Kiwi fruit.  Increase your MOM to 2 tablespoons once a day.   You may try the over the counter Dulcolax if you go several days without a bowel movement.   We will refer you for pelvic floor P.T., they will contact you about an appointment.    I appreciate the opportunity to care for you. Stan Head, MD, [facility]

## 2020-07-31 NOTE — Progress Notes (Signed)
Alexa Rios 75 y.o. 1946/03/28 [id number]  Assessment & Plan:   Encounter Diagnoses  Name Primary?  . Medication side effect, initial encounter - MiraLax allergy Yes  . Chronic constipation   . Anal stenosis     It does seem like she has an allergic reaction to MiraLAX so she will stop that.  Question if it is the polyethylene glycol.  At this time for her constipation which is not terrible but it is causing quality of life issues have advise she try eating to pealed kiwi a day.  We are going to refer for physical therapy to see if pelvic floor physical therapy makes a difference.  I think it could make a big difference for her.  Rectal exam today does suggest some dyssynergia defecation.  She does have a mild anal stenosis so keeping the stool softer should help.  I suspect in part that is why MiraLAX did a better job but she cannot take that now.  Take milk of magnesia 30 mL and 1 dose I suppose she could possibly take more but in general 30 cc is maximum a day.  Consider possible as needed Dulcolax but she is moving her bowels most days I think we just need to fine-tune this a bit and try the physical therapy.  Hold the possibility of instituting Amitiza 8 mcg twice daily in reserve.  I will wait to see the results of her physical therapy evaluation before determining need for follow-up but she certainly may contact me in the interim.  I appreciate the opportunity to care for this patient. CC: Wynn Banker, MD   Subjective:   Chief Complaint: Constipation  HPI Alexa Rios is a 75 year old white woman with GERD and esophageal stricture history and IBS history whom I have known since October of this year, here for follow-up complaining of constipation issues.  She reports that she had been on MiraLAX for a number of years and she generally took it after her morning meal and she would have some itchy eyes and allergy type symptoms.  More recently she  switched to the [employer] brand and on a couple of occasions she took it before breakfast and she developed hives and watery itchy eyes and wondered if she was not allergic to this medication.  She discontinued the MiraLAX and those symptoms went away.  She went back to Citrucel which she had used for years prior to using the MiraLAX and it did not help too much.  She is now currently using milk of magnesia 15 cc twice daily.  She does report having a history of a redundant colon years ago.  She has never had pelvic floor physical therapy.  She is eating prunes as well as the meat milk of magnesia and again is moving her bowels better but still is not at the point where she has the quality of life she wants because of the incomplete defecation.  She does not suffer from urinary incontinence or urinary issues. Allergies  Allergen Reactions  . Miralax [Polyethylene Glycol 3350]   . Sulfamethoxazole-Trimethoprim   . Sumatriptan     palpitations   Current Meds  Medication Sig  . BIOTIN 5000 PO Take 1 tablet by mouth in the morning and at bedtime.   Marland Kitchen CALCIUM PO Take 1,200 mg by mouth daily.  . Esomeprazole Magnesium (NEXIUM 24HR) 20 MG TBEC Take 1 tablet by mouth daily.  . Estradiol 1 MG/GM GEL Apply 1mg  twice a week as  needed  . EVENING PRIMROSE OIL PO Take 500 mg by mouth daily.   Marland Kitchen gabapentin (NEURONTIN) 100 MG capsule Take 2 capsules (200 mg total) by mouth at bedtime. (Patient taking differently: Take 100 mg by mouth as needed.)  . Glucosamine-Chondroitin (COSAMIN DS PO) Take by mouth.  . levothyroxine (SYNTHROID) 25 MCG tablet Take 1 tablet (25 mcg total) by mouth daily before breakfast.  . Magnesium Hydroxide (MILK OF MAGNESIA PO) Take by mouth.  . Multiple Vitamin (MULTIVITAMIN ADULT PO) Take by mouth.  . Potassium 99 MG TABS Take 1 tablet by mouth daily.  . Probiotic Product (PROBIOTIC PO) Take by mouth.  Marland Kitchen UBIQUINOL PO Take 50 mg by mouth daily.  Marland Kitchen Ubrogepant (UBRELVY) 50 MG TABS Take 50 mg  by mouth once as needed for up to 1 dose.  . [DISCONTINUED] Magnesium 200 MG TABS Take by mouth.  . [DISCONTINUED] magnesium hydroxide (MILK OF MAGNESIA) 400 MG/5ML suspension Take by mouth daily as needed for mild constipation.   Past Medical History:  Diagnosis Date  . Colon polyps   . Diverticulitis   . GERD (gastroesophageal reflux disease)   . History of chicken pox   . Hypothyroidism   . Mesenteric adenitis   . Migraine   . Status post dilation of esophageal narrowing   . Thyroid disease    Past Surgical History:  Procedure Laterality Date  . BREAST BIOPSY Left 1976   benign per patient  . BREAST CYST ASPIRATION Right    30 + yrs ago  . CHOLECYSTECTOMY  2015  . COLONOSCOPY    . CYST EXCISION  2019   renal  . REDUCTION MAMMAPLASTY Bilateral 2005  . TONSILLECTOMY AND ADENOIDECTOMY  1951  . 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   Moved to [place] from [place] in May 2021   No alcohol   1 caffeinated beverage daily   Never smoker no drugs no other tobacco   family history includes Arthritis in her sister; Cancer in her brother; Diabetes in her brother and father; Early death in her father; Heart disease in her maternal grandmother and paternal grandmother; High Cholesterol in her maternal grandmother and paternal grandmother; Hyperlipidemia in her brother, father, mother, and sister; Lymphoma in her father; Prostate cancer in her paternal grandfather; Rheum arthritis in her mother; Stroke in her mother and paternal grandfather.   Review of Systems See HPI  Objective:   Physical Exam BP 104/70   Pulse 90   Wt 138 lb (62.6 kg)   BMI 24.06 kg/m    WDWN Thin ww NAD  Clayborne Dana [place] CMA present  Rectal exam  Anoderm looked normal anal wink was intact. Digital rectal exam reveals mild to moderate anal stenosis that is slightly tender  without fissure or mass.   There is reduced voluntary anal squeeze, simulated defecation with appropriate abdominal contraction overall but reduced dissent and what I think is impaired relaxation.

## 2020-08-06 DIAGNOSIS — R109 Unspecified abdominal pain: Secondary | ICD-10-CM | POA: Insufficient documentation

## 2020-08-06 DIAGNOSIS — Z5321 Procedure and treatment not carried out due to patient leaving prior to being seen by health care provider: Secondary | ICD-10-CM | POA: Diagnosis not present

## 2020-08-07 ENCOUNTER — Telehealth: Payer: Self-pay | Admitting: Gastroenterology

## 2020-08-07 ENCOUNTER — Emergency Department (HOSPITAL_COMMUNITY)
Admission: EM | Admit: 2020-08-07 | Discharge: 2020-08-07 | Disposition: A | Discharge status: Home / Self Care | Payer: Medicare PPO | Attending: Emergency Medicine | Admitting: Emergency Medicine

## 2020-08-07 ENCOUNTER — Encounter (HOSPITAL_COMMUNITY): Payer: Self-pay

## 2020-08-07 ENCOUNTER — Other Ambulatory Visit: Payer: Self-pay

## 2020-08-07 ENCOUNTER — Telehealth: Payer: Self-pay

## 2020-08-07 LAB — URINALYSIS, ROUTINE W REFLEX MICROSCOPIC
Bacteria, UA: NONE SEEN
Bilirubin Urine: NEGATIVE
Glucose, UA: NEGATIVE mg/dL
Ketones, ur: 5 mg/dL — AB
Leukocytes,Ua: NEGATIVE
Nitrite: NEGATIVE
Protein, ur: NEGATIVE mg/dL
Specific Gravity, Urine: 1.011 (ref 1.005–1.030)
pH: 6 (ref 5.0–8.0)

## 2020-08-07 LAB — COMPREHENSIVE METABOLIC PANEL
ALT: 19 U/L (ref 0–44)
AST: 22 U/L (ref 15–41)
Albumin: 3.7 g/dL (ref 3.5–5.0)
Alkaline Phosphatase: 89 U/L (ref 38–126)
Anion gap: 9 (ref 5–15)
BUN: 5 mg/dL — ABNORMAL LOW (ref 8–23)
CO2: 24 mmol/L (ref 22–32)
Calcium: 9 mg/dL (ref 8.9–10.3)
Chloride: 99 mmol/L (ref 98–111)
Creatinine, Ser: 0.78 mg/dL (ref 0.44–1.00)
GFR, Estimated: >60 mL/min (ref >60)
Glucose, Bld: 126 mg/dL — ABNORMAL HIGH (ref 70–99)
Potassium: 4 mmol/L (ref 3.5–5.1)
Sodium: 132 mmol/L — ABNORMAL LOW (ref 135–145)
Total Bilirubin: 0.8 mg/dL (ref 0.3–1.2)
Total Protein: 6.7 g/dL (ref 6.5–8.1)

## 2020-08-07 LAB — CBC
HCT: 38.3 % (ref 36.0–46.0)
Hemoglobin: 12.5 g/dL (ref 12.0–15.0)
MCH: 31.1 pg (ref 26.0–34.0)
MCHC: 32.6 g/dL (ref 30.0–36.0)
MCV: 95.3 fL (ref 80.0–100.0)
Platelets: 221 10*3/uL (ref 150–400)
RBC: 4.02 MIL/uL (ref 3.87–5.11)
RDW: 12.8 % (ref 11.5–15.5)
WBC: 11 10*3/uL — ABNORMAL HIGH (ref 4.0–10.5)
nRBC: 0 % (ref 0.0–0.2)

## 2020-08-07 LAB — LIPASE, BLOOD: Lipase: 21 U/L (ref 11–51)

## 2020-08-07 MED ORDER — LUBIPROSTONE 8 MCG PO CAPS: 8.0000 ug | ORAL_CAPSULE | Freq: Two times a day (BID) | ORAL | 2 refills | Status: DC

## 2020-08-07 NOTE — ED Triage Notes (Signed)
Patient coming from home with [facility], reports abdominal pain x several week, reports last "normal" BM, had been taking miralax but had a reaction to it and stopped taking. Did take it this morning with no reaction, has been to GI without relief.

## 2020-08-07 NOTE — Telephone Encounter (Signed)
I did a prior authorization thru [employer] for patient's Lubiprostone 8 mcg she takes for her chronic constipation K59.09. This has been approved thru 07/18/2021. [facility] informed.

## 2020-08-07 NOTE — Telephone Encounter (Signed)
Please f/u her  If still has not moved bowels take 4 dulcolax  Also rx amitiza 8 ug bid w/ food # 60 and refill x 2 w/ f/u me next available and tell her to update [place] if this is not working  Suggest she start My Chart

## 2020-08-07 NOTE — Telephone Encounter (Signed)
Pt calling from [facility] waiting to be seen for constipation and fevers. Chronic constipation has worsened the past few days and is not responding to laxatives outlined in Dr. Marvell Fuller 07/31/2020 office note. Pt restarted Miralax yesterday taking 3 doses however no BM. For past few days she has had low grade fevers to 100 however she had one to 101.9 with chills yesterday. She doesn't feel a fever or chills now. Mild lower abdominal pain that she feels is typical for her constipation. She states she's taken 2 home Covid tests that have been negative since her fevers started. She relates she checked into the ED about 10 pm last night and it will likely be 12 noon before she is seen. She is feeling weak from not eating of drinking for hour and states she might go home instead of waiting.  Advised 2 options to wait to be seen by EDP or home on clear liquids with a Fleets enema, Miralax x 3 again and contact her PCP regarding the fever as it does not appear to be related to her constipation. Forwarding this note to her primary GI physician, Dr. Leone Payor, for his review, further advice and follow up.

## 2020-08-07 NOTE — ED Notes (Signed)
Pt said she spoke with he provider and decided to leave

## 2020-08-07 NOTE — Telephone Encounter (Signed)
Patient notified  rx is at the pharmacy Follow up arranged for 09/23/20

## 2020-08-11 ENCOUNTER — Telehealth: Payer: Self-pay | Admitting: Internal Medicine

## 2020-08-11 NOTE — Telephone Encounter (Signed)
Dr. Rhea Belton your are MD of the day.  Dr. Leone Payor out and you are MD of the am.   Dr. Leone Payor patient was in the ED for severe constipation on 08/07/20.  Recently evaluated in the office on 07/21/20 for the same. She choose to leave ED without being seen .  She reports today she has severe rectal pain, cramping, and is unable to have a BM. She has tried Miralax, [employer], and the Amitiza with no results.  She states her rectum is "protruding".  She repeatedly states "I am in terrible trouble".  Patient is advised she needs to go to the ED now for evaluation.  She is requesting an "emergency CT scan".  I advised again patient will need to go to the ED for evaluation.  See notes from 08/07/20 from Dr. Russella Dar.  Patient verbalized understanding to go to the ED now.  She is worried about extensive waits and asked where she should go.  We discussed she can go to [facility], [facility], or could try [facility] as their waits are usually not as long.  She thanked me for the call.

## 2020-08-11 NOTE — Telephone Encounter (Signed)
Agree with recommendation for ER evaluation.

## 2020-08-11 NOTE — Telephone Encounter (Signed)
Pt is requesting a call back from a nurse in regards to her constipation, pt is in constant pain, pt would like to further discuss with a nurse.

## 2020-08-12 ENCOUNTER — Telehealth: Payer: Self-pay

## 2020-08-12 MED ORDER — AMBULATORY NON FORMULARY MEDICATION: 2 refills | Status: DC

## 2020-08-12 NOTE — Telephone Encounter (Signed)
-----   Message from Iva Boop, MD sent at 08/12/2020  1:02 PM EST ----- Regarding: diltiazem/lidocain I messaged her - please order 2% diltiazem/5% lidocaine cream at [facility] #30 g and 2 refills  You cam My Chart her or call her once you send it in

## 2020-08-12 NOTE — Telephone Encounter (Signed)
Moved patient's appt. Up to 08/14/20 with Amy Esterwood PA. Patient wanted to make sure Dr. Leone Payor had seen her lab results from the hospital

## 2020-08-12 NOTE — Telephone Encounter (Signed)
I called and told Alexa Rios that I am sending in medicine to [facility] for her to try. She said the other nurse had already talked to her about this. She said she will try it. She said she is in a lot of pain.

## 2020-08-14 ENCOUNTER — Other Ambulatory Visit: Payer: Self-pay

## 2020-08-14 ENCOUNTER — Other Ambulatory Visit (INDEPENDENT_AMBULATORY_CARE_PROVIDER_SITE_OTHER): Payer: Medicare PPO

## 2020-08-14 ENCOUNTER — Encounter: Payer: Self-pay | Admitting: Physician Assistant

## 2020-08-14 ENCOUNTER — Ambulatory Visit: Payer: Medicare PPO | Admitting: Physician Assistant

## 2020-08-14 VITALS — BP 110/70 | HR 84 | Ht 63.5 in | Wt 137.6 lb

## 2020-08-14 DIAGNOSIS — K5792 Diverticulitis of intestine, part unspecified, without perforation or abscess without bleeding: Secondary | ICD-10-CM

## 2020-08-14 DIAGNOSIS — R1030 Lower abdominal pain, unspecified: Secondary | ICD-10-CM

## 2020-08-14 DIAGNOSIS — R509 Fever, unspecified: Secondary | ICD-10-CM

## 2020-08-14 DIAGNOSIS — R110 Nausea: Secondary | ICD-10-CM | POA: Diagnosis not present

## 2020-08-14 LAB — CBC WITH DIFFERENTIAL/PLATELET
Basophils Absolute: 0.1 10*3/uL (ref 0.0–0.1)
Basophils Relative: 0.5 % (ref 0.0–3.0)
Eosinophils Absolute: 0.1 10*3/uL (ref 0.0–0.7)
Eosinophils Relative: 0.8 % (ref 0.0–5.0)
HCT: 34.4 % — ABNORMAL LOW (ref 36.0–46.0)
Hemoglobin: 11.7 g/dL — ABNORMAL LOW (ref 12.0–15.0)
Lymphocytes Relative: 13.4 % (ref 12.0–46.0)
Lymphs Abs: 1.4 10*3/uL (ref 0.7–4.0)
MCHC: 33.8 g/dL (ref 30.0–36.0)
MCV: 92.3 fl (ref 78.0–100.0)
Monocytes Absolute: 1.4 10*3/uL — ABNORMAL HIGH (ref 0.1–1.0)
Monocytes Relative: 13.9 % — ABNORMAL HIGH (ref 3.0–12.0)
Neutro Abs: 7.3 10*3/uL (ref 1.4–7.7)
Neutrophils Relative %: 71.4 % (ref 43.0–77.0)
Platelets: 378 10*3/uL (ref 150.0–400.0)
RBC: 3.73 Mil/uL — ABNORMAL LOW (ref 3.87–5.11)
RDW: 12.7 % (ref 11.5–15.5)
WBC: 10.3 10*3/uL (ref 4.0–10.5)

## 2020-08-14 LAB — BASIC METABOLIC PANEL
BUN: 8 mg/dL (ref 6–23)
CO2: 29 mEq/L (ref 19–32)
Calcium: 9.5 mg/dL (ref 8.4–10.5)
Chloride: 98 mEq/L (ref 96–112)
Creatinine, Ser: 0.64 mg/dL (ref 0.40–1.20)
GFR: 87.57 mL/min (ref >60.00)
Glucose, Bld: 96 mg/dL (ref 70–99)
Potassium: 4.4 mEq/L (ref 3.5–5.1)
Sodium: 133 mEq/L — ABNORMAL LOW (ref 135–145)

## 2020-08-14 LAB — SEDIMENTATION RATE: Sed Rate: 75 mm/hr — ABNORMAL HIGH (ref 0–30)

## 2020-08-14 MED ORDER — CIPROFLOXACIN HCL 500 MG PO TABS: 500.0000 mg | ORAL_TABLET | Freq: Two times a day (BID) | ORAL | 0 refills | Status: DC

## 2020-08-14 MED ORDER — METRONIDAZOLE 500 MG PO TABS: 500.0000 mg | ORAL_TABLET | Freq: Two times a day (BID) | ORAL | 0 refills | Status: DC

## 2020-08-14 MED ORDER — DICYCLOMINE HCL 10 MG PO CAPS: ORAL_CAPSULE | ORAL | 1 refills | Status: DC

## 2020-08-14 NOTE — Patient Instructions (Signed)
If you are age 74 or older, your body mass index should be between 23-30. Your Body mass index is 23.99 kg/m. If this is out of the aforementioned range listed, please consider follow up with your Primary Care Provider.  If you are age 77 or younger, your body mass index should be between 19-25. Your Body mass index is 23.99 kg/m. If this is out of the aformentioned range listed, please consider follow up with your Primary Care Provider.   Your provider has requested that you go to the basement level for lab work before leaving today. Press "B" on the elevator. The lab is located at the first door on the left as you exit the elevator.  You have been scheduled for a CT scan of the abdomen and pelvis at [facility], 1st floor Radiology. You are scheduled on _____  at ________. You should arrive 15 minutes prior to your appointment time for registration.  Please pick up 2 bottles of contrast from [place] at least 3 days prior to your scan. The solution may taste better if refrigerated, but do NOT add ice or any other liquid to this solution. Shake well before drinking.   Please follow the written instructions below on the day of your exam:   1) Do not eat anything after _______ (4 hours prior to your test)   2) Drink 1 bottle of contrast @ ______ (2 hours prior to your exam)  Remember to shake well before drinking and do NOT pour over ice.     Drink 1 bottle of contrast @ _______ (10 hour prior to your exam)   You may take any medications as prescribed with a small amount of water, if necessary. If you take any of the following medications: METFORMIN, GLUCOPHAGE, GLUCOVANCE, AVANDAMET, RIOMET, FORTAMET, [place] MET, JANUMET, GLUMETZA or METAGLIP, you MAY be asked to HOLD this medication 48 hours AFTER the exam.   The purpose of you drinking the oral contrast is to aid in the visualization of your intestinal tract. The contrast solution may cause some diarrhea. Depending on your  individual set of symptoms, you may also receive an intravenous injection of x-ray contrast/dye. Plan on being at [facility] for 45 minutes or longer, depending on the type of exam you are having performed.   If you have any questions regarding your exam or if you need to reschedule, you may call [facility] at [phone] between the hours of 8:00 am and 5:00 pm, Monday-Friday.   START the following medications: Cipro 500 mg 1 tablet twice daily for 7 days Metronidazole 500 mg 1 tablet twice daily for 7 days Dicyclomine 10 mg 1 capsule three times a day as needed  Follow a full liquid to soft diet.  Push fluids.  Follow up pending the results of your CT and Labs.  Thank you for entrusting me with your care and choosing [facility]  Amy Esterwood, PA-C

## 2020-08-14 NOTE — Progress Notes (Signed)
Subjective:    Patient ID: Alexa Rios, female    DOB: [birth year], 75 y.o.   MRN: [id number]  HPI Alexa Rios is a pleasant 75 year old white female, known to Dr. Carlean Purl with history of GERD, diverticulosis, trigeminal neuralgia and chronic constipation. She had recently been seen mid January 2022 at that time with complaints of constipation and felt to have a component of anal stenosis on exam.  She had tried MiraLAX and had felt poorly after she had taken a dose on an empty stomach and had questioned an allergy.  She was advised to use milk of magnesia 30 cc instead.  Amitiza was to be considered if no improvement in symptoms. Her last colonoscopy was done in 2016 in [place] with finding of left-sided and sigmoid diverticulosis, no polyps. She comes in today stating that she has been feeling progressively worse over the past 3 weeks with lower abdominal pain which has been fairly constant and now having episodic spasms or cramps which she says feels like labor pains.  She continues to have some difficulty having bowel movements.  She has been taking MiraLAX over the past few days and has been passing some liquid stool but continues to have fairly severe pain.  She has no complaints of anorectal pain, no rectal bleeding.  She has had some fevers late in the afternoons over the past week.  Yesterday had fever to 101 and for that reason went to urgent care to be ruled out for Covid.  Covid testing was negative per patient and also also tested for influenza which was negative.  She feels that the fever is related to her abdominal symptoms.  She has no complaints of dysuria.  She has had prior diverticulitis and says this does feel somewhat similar.  She has not been eating much over the past couple of weeks, sticking to very soft foods.  Review of Systems Pertinent positive and negative review of systems were noted in the above HPI section.  All other review of systems was otherwise  negative.  Outpatient Encounter Medications as of 08/14/2020  Medication Sig  . BIOTIN 5000 PO Take 1 tablet by mouth in the morning and at bedtime.   Marland Kitchen CALCIUM PO Take 1,200 mg by mouth daily.  . ciprofloxacin (CIPRO) 500 MG tablet Take 1 tablet (500 mg total) by mouth 2 (two) times daily for 7 days.  Marland Kitchen dicyclomine (BENTYL) 10 MG capsule Take 1 capsule three times a day as need.  . Estradiol 1 MG/GM GEL Apply 60m twice a week as needed  . EVENING PRIMROSE OIL PO Take 500 mg by mouth daily.   . Glucosamine-Chondroitin (COSAMIN DS PO) Take by mouth.  . levothyroxine (SYNTHROID) 25 MCG tablet Take 1 tablet (25 mcg total) by mouth daily before breakfast.  . Magnesium Hydroxide (MILK OF MAGNESIA PO) Take by mouth.  . metroNIDAZOLE (FLAGYL) 500 MG tablet Take 1 tablet (500 mg total) by mouth 2 (two) times daily for 7 days.  . Multiple Vitamin (MULTIVITAMIN ADULT PO) Take by mouth.  .Marland KitchenUBIQUINOL PO Take 50 mg by mouth daily.  .Marland KitchenUbrogepant (UBRELVY) 50 MG TABS Take 50 mg by mouth once as needed for up to 1 dose.  .Marland KitchenAMBULATORY NON FORMULARY MEDICATION Medication Name: Diltiazem 2% mixed with Lidocaine 5%  Apply a pea size amount to rectum three times a day (Patient not taking: Reported on 08/14/2020)  . Esomeprazole Magnesium (NEXIUM 24HR) 20 MG TBEC Take 1 tablet by mouth daily. (Patient  not taking: Reported on 08/14/2020)  . gabapentin (NEURONTIN) 100 MG capsule Take 2 capsules (200 mg total) by mouth at bedtime. (Patient not taking: Reported on 08/14/2020)  . lubiprostone (AMITIZA) 8 MCG capsule Take 1 capsule (8 mcg total) by mouth 2 (two) times daily with a meal. (Patient not taking: Reported on 08/14/2020)  . Potassium 99 MG TABS Take 1 tablet by mouth daily.  . Probiotic Product (PROBIOTIC PO) Take by mouth. (Patient not taking: Reported on 08/14/2020)   No facility-administered encounter medications on file as of 08/14/2020.   Allergies  Allergen Reactions  . Miralax [Polyethylene Glycol 3350]      Possibly not allergy  . Sulfamethoxazole-Trimethoprim   . Sumatriptan     palpitations   Patient Active Problem List   Diagnosis Date Noted  . Chronic constipation 07/31/2020  . Trigeminal neuralgia of right side of face 03/15/2020  . Occipital neuralgia of right side 03/15/2020  . Diverticulosis 03/15/2020  . Migraine headache with aura 03/15/2020  . Hypothyroid 03/15/2020  . Environmental and seasonal allergies 03/15/2020  . Heartburn 03/15/2020   Social History   Socioeconomic History  . Marital status: Widowed    Spouse name: Not on file  . Number of children: 1  . Years of education: Not on file  . Highest education level: Not on file  Occupational History  . Occupation: retired  Tobacco Use  . Smoking status: Never Smoker  . Smokeless tobacco: Never Used  Vaping Use  . Vaping Use: Never used  Substance and Sexual Activity  . Alcohol use: Not Currently  . Drug use: Never  . Sexual activity: Not Currently  Other Topics Concern  . 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 Crain's family history includes Arthritis in her sister; Cancer in her brother; Diabetes in her brother and father; Early death in her father; Heart disease in her maternal grandmother and paternal grandmother; High Cholesterol in her maternal grandmother and paternal grandmother; Hyperlipidemia in her brother, father, mother, and sister; Lymphoma in her father; Prostate cancer in her paternal grandfather; Rheum  arthritis in her mother; Stroke in her mother and paternal grandfather.      Objective:    Vitals:   08/14/20 1357  BP: 110/70  Pulse: 84    Physical Exam Well-developed well-nourished older WF  in no acute distress.  Pleasant, height, Weight, BMI 23.99  HEENT; nontraumatic normocephalic, EOMI, PE RR LA, sclera anicteric. Oropharynx; not done Neck; supple, no JVD Cardiovascular; regular rate and rhythm with S1-S2, no murmur rub or gallop Pulmonary; Clear bilaterally Abdomen; soft,  nondistended, she is tender in the left lower quadrant and across the suprapubic area some guarding ,no rebound ,no palpable mass or hepatosplenomegaly, bowel sounds are somewhat active Rectal; not done today Skin; benign exam, no jaundice rash or appreciable lesions Extremities; no clubbing cyanosis or edema skin warm and dry Neuro/Psych; alert and oriented x4, grossly  nonfocal mood and affect appropriate       Assessment & Plan:   #40 75 year old white female with 3-week history of progressive lower abdominal pain which has been fairly constant and now having episodes of more severe cramping, constipation, intermittent fevers over the past 1 week and decrease in appetite. I suspect she has acute diverticulitis, rule out other intra-abdominal inflammatory process/infection  #2 GERD stable  #3 chronic constipation #4.  Trigeminal neuralgia  Plan; Continue full liquid to soft diet, push fluids CBC with differential, be met, sed rate Schedule for CT of the abdomen and pelvis within the next 24 hours Start Cipro 500 mg p.o. twice daily and Flagyl 500 mg p.o. twice daily x14 days We will also send prescription for Bentyl 10 mg p.o. 3 times daily as needed for abdominal pain/cramping. Further recommendations pending results of labs and CT.  Plan  Monetta Lick Genia Harold PA-C 08/14/2020   Cc: Caren Macadam, MD

## 2020-08-15 ENCOUNTER — Ambulatory Visit (HOSPITAL_COMMUNITY)
Admission: RE | Admit: 2020-08-15 | Discharge: 2020-08-15 | Disposition: A | Discharge status: Home / Self Care | Payer: Medicare PPO | Source: Ambulatory Visit | Attending: Physician Assistant | Admitting: Physician Assistant

## 2020-08-15 ENCOUNTER — Other Ambulatory Visit: Payer: Self-pay

## 2020-08-15 ENCOUNTER — Encounter (HOSPITAL_COMMUNITY): Payer: Self-pay

## 2020-08-15 DIAGNOSIS — K5792 Diverticulitis of intestine, part unspecified, without perforation or abscess without bleeding: Secondary | ICD-10-CM | POA: Insufficient documentation

## 2020-08-15 DIAGNOSIS — R509 Fever, unspecified: Secondary | ICD-10-CM | POA: Insufficient documentation

## 2020-08-15 DIAGNOSIS — R110 Nausea: Secondary | ICD-10-CM | POA: Insufficient documentation

## 2020-08-15 DIAGNOSIS — R1030 Lower abdominal pain, unspecified: Secondary | ICD-10-CM | POA: Insufficient documentation

## 2020-08-15 DIAGNOSIS — K5720 Diverticulitis of large intestine with perforation and abscess without bleeding: Secondary | ICD-10-CM | POA: Diagnosis not present

## 2020-08-15 MED ORDER — IOHEXOL 300 MG/ML  SOLN
100.0000 mL | Freq: Once | INTRAMUSCULAR | Status: AC | PRN
  Administered 2020-08-15: 100 mL via INTRAVENOUS

## 2020-08-16 ENCOUNTER — Telehealth: Payer: Self-pay | Admitting: Internal Medicine

## 2020-08-16 ENCOUNTER — Encounter (HOSPITAL_COMMUNITY): Payer: Self-pay | Admitting: Emergency Medicine

## 2020-08-16 ENCOUNTER — Other Ambulatory Visit: Payer: Self-pay

## 2020-08-16 ENCOUNTER — Inpatient Hospital Stay (HOSPITAL_COMMUNITY)
Admission: EM | Admit: 2020-08-16 | Discharge: 2020-08-20 | DRG: 392 | Disposition: A | Discharge status: Home / Self Care | Payer: Medicare PPO | Attending: Internal Medicine | Admitting: Internal Medicine

## 2020-08-16 DIAGNOSIS — Z79890 Hormone replacement therapy (postmenopausal): Secondary | ICD-10-CM | POA: Diagnosis not present

## 2020-08-16 DIAGNOSIS — K654 Sclerosing mesenteritis: Secondary | ICD-10-CM | POA: Diagnosis present

## 2020-08-16 DIAGNOSIS — Z83438 Family history of other disorder of lipoprotein metabolism and other lipidemia: Secondary | ICD-10-CM | POA: Diagnosis not present

## 2020-08-16 DIAGNOSIS — K5909 Other constipation: Secondary | ICD-10-CM | POA: Diagnosis present

## 2020-08-16 DIAGNOSIS — R112 Nausea with vomiting, unspecified: Secondary | ICD-10-CM | POA: Diagnosis present

## 2020-08-16 DIAGNOSIS — K5720 Diverticulitis of large intestine with perforation and abscess without bleeding: Principal | ICD-10-CM | POA: Diagnosis present

## 2020-08-16 DIAGNOSIS — Z8261 Family history of arthritis: Secondary | ICD-10-CM | POA: Diagnosis not present

## 2020-08-16 DIAGNOSIS — M8580 Other specified disorders of bone density and structure, unspecified site: Secondary | ICD-10-CM | POA: Diagnosis present

## 2020-08-16 DIAGNOSIS — Z888 Allergy status to other drugs, medicaments and biological substances status: Secondary | ICD-10-CM

## 2020-08-16 DIAGNOSIS — R1310 Dysphagia, unspecified: Secondary | ICD-10-CM | POA: Diagnosis present

## 2020-08-16 DIAGNOSIS — Z8249 Family history of ischemic heart disease and other diseases of the circulatory system: Secondary | ICD-10-CM

## 2020-08-16 DIAGNOSIS — G500 Trigeminal neuralgia: Secondary | ICD-10-CM | POA: Diagnosis present

## 2020-08-16 DIAGNOSIS — Z20822 Contact with and (suspected) exposure to covid-19: Secondary | ICD-10-CM | POA: Diagnosis present

## 2020-08-16 DIAGNOSIS — K5792 Diverticulitis of intestine, part unspecified, without perforation or abscess without bleeding: Secondary | ICD-10-CM | POA: Diagnosis present

## 2020-08-16 DIAGNOSIS — Z807 Family history of other malignant neoplasms of lymphoid, hematopoietic and related tissues: Secondary | ICD-10-CM | POA: Diagnosis not present

## 2020-08-16 DIAGNOSIS — Z8719 Personal history of other diseases of the digestive system: Secondary | ICD-10-CM | POA: Diagnosis not present

## 2020-08-16 DIAGNOSIS — Z79899 Other long term (current) drug therapy: Secondary | ICD-10-CM | POA: Diagnosis not present

## 2020-08-16 DIAGNOSIS — Z882 Allergy status to sulfonamides status: Secondary | ICD-10-CM | POA: Diagnosis not present

## 2020-08-16 DIAGNOSIS — K219 Gastro-esophageal reflux disease without esophagitis: Secondary | ICD-10-CM | POA: Diagnosis present

## 2020-08-16 DIAGNOSIS — K5710 Diverticulosis of small intestine without perforation or abscess without bleeding: Secondary | ICD-10-CM | POA: Diagnosis present

## 2020-08-16 DIAGNOSIS — Z823 Family history of stroke: Secondary | ICD-10-CM | POA: Diagnosis not present

## 2020-08-16 DIAGNOSIS — G43909 Migraine, unspecified, not intractable, without status migrainosus: Secondary | ICD-10-CM | POA: Diagnosis present

## 2020-08-16 DIAGNOSIS — Z833 Family history of diabetes mellitus: Secondary | ICD-10-CM

## 2020-08-16 DIAGNOSIS — E039 Hypothyroidism, unspecified: Secondary | ICD-10-CM | POA: Diagnosis present

## 2020-08-16 LAB — BASIC METABOLIC PANEL
Anion gap: 11 (ref 5–15)
BUN: 9 mg/dL (ref 8–23)
CO2: 23 mmol/L (ref 22–32)
Calcium: 8.6 mg/dL — ABNORMAL LOW (ref 8.9–10.3)
Chloride: 102 mmol/L (ref 98–111)
Creatinine, Ser: 0.65 mg/dL (ref 0.44–1.00)
GFR, Estimated: >60 mL/min (ref >60)
Glucose, Bld: 102 mg/dL — ABNORMAL HIGH (ref 70–99)
Potassium: 3.7 mmol/L (ref 3.5–5.1)
Sodium: 136 mmol/L (ref 135–145)

## 2020-08-16 LAB — CBC WITH DIFFERENTIAL/PLATELET
Abs Immature Granulocytes: 0.04 10*3/uL (ref 0.00–0.07)
Basophils Absolute: 0.1 10*3/uL (ref 0.0–0.1)
Basophils Relative: 0 %
Eosinophils Absolute: 0.1 10*3/uL (ref 0.0–0.5)
Eosinophils Relative: 1 %
HCT: 33.9 % — ABNORMAL LOW (ref 36.0–46.0)
Hemoglobin: 11.1 g/dL — ABNORMAL LOW (ref 12.0–15.0)
Immature Granulocytes: 0 %
Lymphocytes Relative: 8 %
Lymphs Abs: 1 10*3/uL (ref 0.7–4.0)
MCH: 31.2 pg (ref 26.0–34.0)
MCHC: 32.7 g/dL (ref 30.0–36.0)
MCV: 95.2 fL (ref 80.0–100.0)
Monocytes Absolute: 1.1 10*3/uL — ABNORMAL HIGH (ref 0.1–1.0)
Monocytes Relative: 9 %
Neutro Abs: 9.9 10*3/uL — ABNORMAL HIGH (ref 1.7–7.7)
Neutrophils Relative %: 82 %
Platelets: 399 10*3/uL (ref 150–400)
RBC: 3.56 MIL/uL — ABNORMAL LOW (ref 3.87–5.11)
RDW: 12.1 % (ref 11.5–15.5)
WBC: 12.2 10*3/uL — ABNORMAL HIGH (ref 4.0–10.5)
nRBC: 0 % (ref 0.0–0.2)

## 2020-08-16 LAB — SARS CORONAVIRUS 2 (TAT 6-24 HRS): SARS Coronavirus 2: NEGATIVE

## 2020-08-16 MED ORDER — METRONIDAZOLE IN NACL 5-0.79 MG/ML-% IV SOLN
500.0000 mg | Freq: Once | INTRAVENOUS | Status: AC
  Administered 2020-08-16: 500 mg via INTRAVENOUS
  Filled 2020-08-16: qty 100

## 2020-08-16 MED ORDER — SODIUM CHLORIDE 0.9 % IV SOLN: 2.0000 g | Freq: Once | INTRAVENOUS | Status: DC

## 2020-08-16 MED ORDER — GLUCOSAMINE-CHONDROITIN 500-400 MG PO CAPS: ORAL_CAPSULE | Freq: Every day | ORAL | Status: DC

## 2020-08-16 MED ORDER — POLYETHYLENE GLYCOL 3350 17 G PO PACK
17.0000 g | PACK | Freq: Three times a day (TID) | ORAL | Status: DC
  Administered 2020-08-16 – 2020-08-17 (×3): 17 g via ORAL
  Filled 2020-08-16 (×4): qty 1

## 2020-08-16 MED ORDER — METRONIDAZOLE 500 MG PO TABS: 500.0000 mg | ORAL_TABLET | Freq: Three times a day (TID) | ORAL | Status: DC

## 2020-08-16 MED ORDER — ENOXAPARIN SODIUM 40 MG/0.4ML ~~LOC~~ SOLN
40.0000 mg | SUBCUTANEOUS | Status: DC
  Administered 2020-08-16 – 2020-08-19 (×4): 40 mg via SUBCUTANEOUS
  Filled 2020-08-16 (×4): qty 0.4

## 2020-08-16 MED ORDER — ACETAMINOPHEN 325 MG PO TABS: 650.0000 mg | ORAL_TABLET | Freq: Four times a day (QID) | ORAL | Status: DC | PRN

## 2020-08-16 MED ORDER — ONDANSETRON HCL 4 MG/2ML IJ SOLN
4.0000 mg | Freq: Four times a day (QID) | INTRAMUSCULAR | Status: DC | PRN
  Administered 2020-08-17 – 2020-08-20 (×5): 4 mg via INTRAVENOUS
  Filled 2020-08-16 (×5): qty 2

## 2020-08-16 MED ORDER — LEVOTHYROXINE SODIUM 25 MCG PO TABS
25.0000 ug | ORAL_TABLET | Freq: Every day | ORAL | Status: DC
  Administered 2020-08-17 – 2020-08-20 (×4): 25 ug via ORAL
  Filled 2020-08-16 (×4): qty 1

## 2020-08-16 MED ORDER — ONDANSETRON HCL 4 MG/2ML IJ SOLN
4.0000 mg | Freq: Once | INTRAMUSCULAR | Status: AC
  Administered 2020-08-16: 4 mg via INTRAVENOUS
  Filled 2020-08-16: qty 2

## 2020-08-16 MED ORDER — METRONIDAZOLE 500 MG PO TABS
500.0000 mg | ORAL_TABLET | Freq: Three times a day (TID) | ORAL | Status: DC
  Administered 2020-08-16 – 2020-08-17 (×2): 500 mg via ORAL
  Filled 2020-08-16 (×3): qty 1

## 2020-08-16 MED ORDER — KETOROLAC TROMETHAMINE 15 MG/ML IJ SOLN
15.0000 mg | Freq: Four times a day (QID) | INTRAMUSCULAR | Status: AC | PRN
  Administered 2020-08-16 – 2020-08-19 (×3): 15 mg via INTRAVENOUS
  Filled 2020-08-16 (×4): qty 1

## 2020-08-16 MED ORDER — SODIUM CHLORIDE 0.9 % IV SOLN
2.0000 g | INTRAVENOUS | Status: DC
  Administered 2020-08-16 – 2020-08-19 (×4): 2 g via INTRAVENOUS
  Filled 2020-08-16 (×2): qty 2
  Filled 2020-08-16 (×3): qty 20

## 2020-08-16 MED ORDER — HYDROMORPHONE HCL 1 MG/ML IJ SOLN
0.5000 mg | INTRAMUSCULAR | Status: DC | PRN
  Administered 2020-08-18: 0.5 mg via INTRAVENOUS
  Filled 2020-08-16: qty 0.5

## 2020-08-16 MED ORDER — UBROGEPANT 50 MG PO TABS
50.0000 mg | ORAL_TABLET | Freq: Every day | ORAL | Status: DC | PRN
  Administered 2020-08-17: 50 mg via ORAL

## 2020-08-16 MED ORDER — SODIUM CHLORIDE 0.9 % IV SOLN
1000.0000 mL | INTRAVENOUS | Status: DC
  Administered 2020-08-16 – 2020-08-18 (×3): 1000 mL via INTRAVENOUS

## 2020-08-16 MED ORDER — ACETAMINOPHEN 650 MG RE SUPP: 650.0000 mg | Freq: Four times a day (QID) | RECTAL | Status: DC | PRN

## 2020-08-16 MED ORDER — MORPHINE SULFATE (PF) 2 MG/ML IV SOLN
2.0000 mg | INTRAVENOUS | Status: AC
  Administered 2020-08-16: 2 mg via INTRAVENOUS
  Filled 2020-08-16: qty 1

## 2020-08-16 MED ORDER — SODIUM CHLORIDE 0.9 % IV BOLUS (SEPSIS)
500.0000 mL | Freq: Once | INTRAVENOUS | Status: AC
  Administered 2020-08-16: 500 mL via INTRAVENOUS

## 2020-08-16 MED ORDER — ONDANSETRON HCL 4 MG PO TABS
4.0000 mg | ORAL_TABLET | Freq: Four times a day (QID) | ORAL | Status: DC | PRN
  Administered 2020-08-16: 4 mg via ORAL
  Filled 2020-08-16: qty 1

## 2020-08-16 NOTE — H&P (Signed)
History and Physical    Alexa Rios IEP:[id number] DOB: 03/08/1946 DOA: 08/16/2020  PCP: Wynn Banker, MD (Confirm with patient/family/NH records and if not entered, this has to be entered at [facility] point of entry) Patient coming from: Home  I have personally briefly reviewed patient's old medical records in [facility]  Chief Complaint: Abdominal pain, nausea vomiting  HPI: Alexa Rios is a 75 y.o. female with medical history significant of recurrent diverticulitis x2 in the past, hypothyroidism, presented with recurrent diverticulitis.  MDM started about 3 weeks ago, with progressive worsening lower abdominal pain location is on the lower left quadrants, cramping like worsening with eating, and she has been mostly constipated no diarrhea.  Had some fever earlier in the process but no more fever in the last week.  Also, increasingly she started to feel nauseous and occasional vomiting.  She went to see her GI [place] yesterday, suspecting recurrent type colitis CT scan ordered.  Today's the CT scan resolved and showing acute diverticulitis with distal sigmoid colon intramural or pericolic abscess formation.  GI sent patient down to ED.  ED Course: WBC 12.2.  Vital signs stable no hypotension or fever.  Review of Systems: As per HPI otherwise 14 point review of systems negative.    Past Medical History:  Diagnosis Date  . Colon polyps   . Diverticulitis   . GERD (gastroesophageal reflux disease)   . History of chicken pox   . Hypothyroidism   . Mesenteric adenitis   . Migraine   . Status post dilation of esophageal narrowing   . Thyroid disease     Past Surgical History:  Procedure Laterality Date  . BREAST BIOPSY Left 1976   benign per patient  . BREAST CYST ASPIRATION Right    30 + yrs ago  . CHOLECYSTECTOMY  2015  . COLONOSCOPY    . KIDNEY CYST REMOVAL    . REDUCTION MAMMAPLASTY Bilateral 2005  . TONSILLECTOMY AND ADENOIDECTOMY  1951  .  VAGINAL HYSTERECTOMY  2000   benign path     reports that she has never smoked. She has never used smokeless tobacco. She reports previous alcohol use. She reports that she does not use drugs.  Allergies  Allergen Reactions  . Sulfamethoxazole-Trimethoprim   . Sumatriptan     palpitations    Family History  Problem Relation Age of Onset  . Rheum arthritis Mother   . Hyperlipidemia Mother   . Stroke Mother   . Lymphoma Father   . Diabetes Father   . Early death Father   . Hyperlipidemia Father   . Cancer Brother        ureteral  . Diabetes Brother   . Hyperlipidemia Brother   . Arthritis Sister   . Hyperlipidemia Sister   . Heart disease Maternal Grandmother   . High Cholesterol Maternal Grandmother   . Heart disease Paternal Grandmother   . High Cholesterol Paternal Grandmother   . Prostate cancer Paternal Grandfather   . Stroke Paternal Grandfather   . Colon cancer Neg Hx   . Pancreatic cancer Neg Hx   . Esophageal cancer Neg Hx     Prior to Admission medications   Medication Sig Start Date End Date Taking? Authorizing Provider  BIOTIN 5000 PO Take 1 tablet by mouth in the morning and at bedtime.    Yes [provider]  CALCIUM PO Take 1,200 mg by mouth daily.   Yes [provider]  Estradiol 1  MG/GM GEL Apply 1mg  twice a week as needed Patient taking differently: Apply 1 application topically once a week. Apply twice weekly on Wednesday and Sundays 04/09/20  Yes Koberlein, 04/11/20, MD  EVENING PRIMROSE OIL PO Take 500 mg by mouth daily.    Yes [provider]  Glucosamine-Chondroitin (COSAMIN DS PO) Take 1 tablet by mouth daily.   Yes [provider]  levothyroxine (SYNTHROID) 25 MCG tablet Take 1 tablet (25 mcg total) by mouth daily before breakfast. 11/21/19  Yes Koberlein, Junell C, MD  Multiple Vitamin (MULTIVITAMIN ADULT PO) Take by mouth.   Yes [provider]  polyethylene glycol (MIRALAX / GLYCOLAX) 17 g packet Take  17 g by mouth 3 (three) times daily.   Yes [provider]  Potassium 99 MG TABS Take 1 tablet by mouth daily.   Yes [provider]  Ubrogepant (UBRELVY) 50 MG TABS Take 50 mg by mouth once as needed for up to 1 dose. Patient taking differently: Take 50 mg by mouth daily as needed (For migraine). 03/14/20  Yes Koberlein, 03/16/20, MD  ciprofloxacin (CIPRO) 500 MG tablet Take 1 tablet (500 mg total) by mouth 2 (two) times daily for 7 days. Patient not taking: Reported on 08/16/2020 08/14/20 08/21/20  Esterwood, Amy S, PA-C  dicyclomine (BENTYL) 10 MG capsule Take 1 capsule three times a day as need. Patient not taking: Reported on 08/16/2020 08/14/20   08/16/20, PA-C    Physical Exam: Vitals:   08/16/20 1407 08/16/20 1445 08/16/20 1609  BP: 126/72 119/70 115/78  Pulse: 90 89 89  Resp: 19 19 15   Temp: 97.9 F (36.6 C)    TempSrc: Oral    SpO2: 100% 100% 100%    Constitutional: NAD, calm, comfortable Vitals:   08/16/20 1407 08/16/20 1445 08/16/20 1609  BP: 126/72 119/70 115/78  Pulse: 90 89 89  Resp: 19 19 15   Temp: 97.9 F (36.6 C)    TempSrc: Oral    SpO2: 100% 100% 100%   Eyes: PERRL, lids and conjunctivae normal ENMT: Mucous membranes are moist. Posterior pharynx clear of any exudate or lesions.Normal dentition.  Neck: normal, supple, no masses, no thyromegaly Respiratory: clear to auscultation bilaterally, no wheezing, no crackles. Normal respiratory effort. No accessory muscle use.  Cardiovascular: Regular rate and rhythm, no murmurs / rubs / gallops. No extremity edema. 2+ pedal pulses. No carotid bruits.  Abdomen: LLQ tenderness no rebound no guarding, no masses palpated. No hepatosplenomegaly. Bowel sounds positive.  Musculoskeletal: no clubbing / cyanosis. No joint deformity upper and lower extremities. Good ROM, no contractures. Normal muscle tone.  Skin: no rashes, lesions, ulcers. No induration Neurologic: CN 2-12 grossly intact. Sensation  intact, DTR normal. Strength 5/5 in all 4.  Psychiatric: Normal judgment and insight. Alert and oriented x 3. Normal mood.     Labs on Admission: I have personally reviewed following labs and imaging studies  CBC: Recent Labs  Lab 08/14/20 1503 08/16/20 1459  WBC 10.3 12.2*  NEUTROABS 7.3 9.9*  HGB 11.7* 11.1*  HCT 34.4* 33.9*  MCV 92.3 95.2  PLT 378.0 399   Basic Metabolic Panel: Recent Labs  Lab 08/14/20 1503 08/16/20 1459  NA 133* 136  K 4.4 3.7  CL 98 102  CO2 29 23  GLUCOSE 96 102*  BUN 8 9  CREATININE 0.64 0.65  CALCIUM 9.5 8.6*   GFR: Estimated Creatinine Clearance: 53 mL/min (by C-G formula based on SCr of 0.65 mg/dL). Liver Function Tests:  No results for input(s): AST, ALT, ALKPHOS, BILITOT, PROT, ALBUMIN in the last 168 hours. No results for input(s): LIPASE, AMYLASE in the last 168 hours. No results for input(s): AMMONIA in the last 168 hours. Coagulation Profile: No results for input(s): INR, PROTIME in the last 168 hours. Cardiac Enzymes: No results for input(s): CKTOTAL, CKMB, CKMBINDEX, TROPONINI in the last 168 hours. BNP (last 3 results) No results for input(s): PROBNP in the last 8760 hours. HbA1C: No results for input(s): HGBA1C in the last 72 hours. CBG: No results for input(s): GLUCAP in the last 168 hours. Lipid Profile: No results for input(s): CHOL, HDL, LDLCALC, TRIG, CHOLHDL, LDLDIRECT in the last 72 hours. Thyroid Function Tests: No results for input(s): TSH, T4TOTAL, FREET4, T3FREE, THYROIDAB in the last 72 hours. Anemia Panel: No results for input(s): VITAMINB12, FOLATE, FERRITIN, TIBC, IRON, RETICCTPCT in the last 72 hours. Urine analysis:    Component Value Date/Time   COLORURINE YELLOW 08/07/2020 0129   APPEARANCEUR CLEAR 08/07/2020 0129   LABSPEC 1.011 08/07/2020 0129   PHURINE 6.0 08/07/2020 0129   GLUCOSEU NEGATIVE 08/07/2020 0129   HGBUR LARGE (A) 08/07/2020 0129   BILIRUBINUR NEGATIVE 08/07/2020 0129   KETONESUR 5  (A) 08/07/2020 0129   PROTEINUR NEGATIVE 08/07/2020 0129   NITRITE NEGATIVE 08/07/2020 0129   LEUKOCYTESUR NEGATIVE 08/07/2020 0129    Radiological Exams on Admission: CT Abdomen Pelvis W Contrast  Result Date: 08/15/2020 CLINICAL DATA:  Progressive lower abdominal pain with cramping and constipation along with intermittent fevers. EXAM: CT ABDOMEN AND PELVIS WITH CONTRAST TECHNIQUE: Multidetector CT imaging of the abdomen and pelvis was performed using the standard protocol following bolus administration of intravenous contrast. CONTRAST:  OMNIPAQUE IOHEXOL 300 MG/ML  SOLN COMPARISON:  None. FINDINGS: Lower chest: Unremarkable Hepatobiliary: 0.7 by 0.6 cm hypodense lesion in the dome of the right hepatic lobe on image 9 of series 2 is technically nonspecific although statistically likely to be benign. Similar 0.8 by 0.5 cm lesion in the lateral segment left hepatic lobe on image 14 of series 2. Cholecystectomy noted. Common bile duct measures up to 0.9 cm, mildly prominent but probably a physiologic response to prior cholecystectomy. Pancreas: Unremarkable Spleen: Unremarkable Adrenals/Urinary Tract: The adrenal glands appear normal. Prominent scarring in the right kidney upper pole posteriorly with adjacent calyceal diverticulum. Small hypodense lesions in the right kidney lower pole are technically too small to characterize although statistically likely to be cysts. Urinary bladder unremarkable. 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 The orally administered contrast makes its way through to the ascending colon. There is a notably inflamed segment of distal sigmoid colon measuring at least 10 cm in length, with considerable wall thickening and diverticulosis. 3.1 by 1.2 by 1.9 cm fluid density along the wall of the distal sigmoid colon could represent an intramural or paracolic abscess. No overtly free intraperitoneal gas is identified. The there is  surrounding edema in the mesentery. Vascular/Lymphatic: Mild aortoiliac atherosclerotic vascular calcification. No pathologic adenopathy identified. Reproductive: Uterus absent.  Adnexa unremarkable. Other: No supplemental non-categorized findings. 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 Mild dextroconvex lower lumbar scoliosis. Grade 1 degenerative anterolisthesis at L4-5. Lower lumbar spondylosis and degenerative disc disease resulting in mild left foraminal impingement at L4-5 and potentially at L5-S1. IMPRESSION: 1. Prominently inflamed segment of distal sigmoid colon measuring at least 10 cm in length, with considerable wall thickening and diverticulosis. There is a 3.1 by 1.2 by 1.9 cm  fluid density along the wall of the distal sigmoid colon could represent an intramural or paracolic abscess. No overtly free intraperitoneal gas is identified. 2. Prominent scarring in the right kidney upper pole posteriorly with adjacent calyceal diverticulum. 3. 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 4. Lower lumbar spondylosis and degenerative disc disease resulting in mild left foraminal impingement at L4-5 and potentially at L5-S1. 5. Small hypodense lesions in the liver and right kidney lower pole are technically too small to characterize although statistically likely to be benign. 6. 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 7. Aortic atherosclerosis. Aortic Atherosclerosis (ICD10-I70.0). Electronically Signed   By: Gaylyn Rong M.D.   On: 08/15/2020 19:53    EKG: None  Assessment/Plan Active Problems:   Diverticulitis  (please populate well all problems here in Problem List. (For example, if patient is on BP meds at home and you resume or decide to hold them, it is a problem that needs to be her. Same for CAD, COPD, HLD and so on)   Acute complicated diverticulitis with  intramural/paracolic abscess formation -Recurrent in nature -Discussed with on-call [facility] Dr. Elnoria Howard, recommend IR and surgery consult -IV antibiotics: Rocephin and Flagyl, aiming at 10 to 14 days, then repeat CT versus colonoscopy as outpatient. -Pain control -Trial of liquid diet  Hypothyroidism -Continue Synthroid  DVT prophylaxis: Lovenox  code Status: Full code Family Communication: None at bedside Disposition Plan: Expect more than 2 midnight hospital stay, IV antibiotics bridging for p.o. antibiotics and trial of diet Consults called: IR and general surgery (left a message as answering service) Admission status: MedSurg admission   Emeline General MD [facility] Pager [phone]  08/16/2020, 5:26 PM

## 2020-08-16 NOTE — Consult Note (Signed)
CC: diverticulitis   Requesting provider: Dr Chipper Herb  HPI: Alexa Rios is an 75 y.o. female who is here for diverticulitis with intramural abscess.  She has had ~3 wks of worsening abd pain.  Now with occasional nausea and vomiting.    Past Medical History:  Diagnosis Date  . Colon polyps   . Diverticulitis   . GERD (gastroesophageal reflux disease)   . History of chicken pox   . Hypothyroidism   . Mesenteric adenitis   . Migraine   . Status post dilation of esophageal narrowing   . Thyroid disease     Past Surgical History:  Procedure Laterality Date  . BREAST BIOPSY Left 1976   benign per patient  . BREAST CYST ASPIRATION Right    30 + yrs ago  . CHOLECYSTECTOMY  2015  . COLONOSCOPY    . KIDNEY CYST REMOVAL    . REDUCTION MAMMAPLASTY Bilateral 2005  . TONSILLECTOMY AND ADENOIDECTOMY  1951  . VAGINAL HYSTERECTOMY  2000   benign path    Family History  Problem Relation Age of Onset  . Rheum arthritis Mother   . Hyperlipidemia Mother   . Stroke Mother   . Lymphoma Father   . Diabetes Father   . Early death Father   . Hyperlipidemia Father   . Cancer Brother        ureteral  . Diabetes Brother   . Hyperlipidemia Brother   . Arthritis Sister   . Hyperlipidemia Sister   . Heart disease Maternal Grandmother   . High Cholesterol Maternal Grandmother   . Heart disease Paternal Grandmother   . High Cholesterol Paternal Grandmother   . Prostate cancer Paternal Grandfather   . Stroke Paternal Grandfather   . Colon cancer Neg Hx   . Pancreatic cancer Neg Hx   . Esophageal cancer Neg Hx     Social:  reports that she has never smoked. She has never used smokeless tobacco. She reports previous alcohol use. She reports that she does not use drugs.  Allergies:  Allergies  Allergen Reactions  . Sulfamethoxazole-Trimethoprim   . Sumatriptan     palpitations    Medications: I have reviewed the patient's current medications.  Results for orders placed  or performed during the hospital encounter of 08/16/20 (from the past 48 hour(s))  CBC with Differential/Platelet     Status: Abnormal   Collection Time: 08/16/20  2:59 PM  Result Value Ref Range   WBC 12.2 (H) 4.0 - 10.5 K/uL   RBC 3.56 (L) 3.87 - 5.11 MIL/uL   Hemoglobin 11.1 (L) 12.0 - 15.0 g/dL   HCT 16.0 (L) 73.7 - 10.6 %   MCV 95.2 80.0 - 100.0 fL   MCH 31.2 26.0 - 34.0 pg   MCHC 32.7 30.0 - 36.0 g/dL   RDW 26.9 48.5 - 46.2 %   Platelets 399 150 - 400 K/uL   nRBC 0.0 0.0 - 0.2 %   Neutrophils Relative % 82 %   Neutro Abs 9.9 (H) 1.7 - 7.7 K/uL   Lymphocytes Relative 8 %   Lymphs Abs 1.0 0.7 - 4.0 K/uL   Monocytes Relative 9 %   Monocytes Absolute 1.1 (H) 0.1 - 1.0 K/uL   Eosinophils Relative 1 %   Eosinophils Absolute 0.1 0.0 - 0.5 K/uL   Basophils Relative 0 %   Basophils Absolute 0.1 0.0 - 0.1 K/uL   Immature Granulocytes 0 %   Abs Immature Granulocytes 0.04 0.00 - 0.07 K/uL  Comment: Performed at [facility], [address] [place], [place] [address]  Basic metabolic panel     Status: Abnormal   Collection Time: 08/16/20  2:59 PM  Result Value Ref Range   Sodium 136 135 - 145 mmol/L   Potassium 3.7 3.5 - 5.1 mmol/L   Chloride 102 98 - 111 mmol/L   CO2 23 22 - 32 mmol/L   Glucose, Bld 102 (H) 70 - 99 mg/dL    Comment: Glucose reference range applies only to samples taken after fasting for at least 8 hours.   BUN 9 8 - 23 mg/dL   Creatinine, Ser 3.88 0.44 - 1.00 mg/dL   Calcium 8.6 (L) 8.9 - 10.3 mg/dL   GFR, Estimated >82 >80 mL/min    Comment: (NOTE) Calculated using the CKD-EPI Creatinine Equation (2021)    Anion gap 11 5 - 15    Comment: Performed at [facility], [address], [place], [place] [address]    CT Abdomen Pelvis W Contrast  Result Date: 08/15/2020 CLINICAL DATA:  Progressive lower abdominal pain with cramping and constipation along with intermittent fevers. EXAM: CT ABDOMEN AND PELVIS WITH CONTRAST  TECHNIQUE: Multidetector CT imaging of the abdomen and pelvis was performed using the standard protocol following bolus administration of intravenous contrast. CONTRAST:  OMNIPAQUE IOHEXOL 300 MG/ML  SOLN COMPARISON:  None. FINDINGS: Lower chest: Unremarkable Hepatobiliary: 0.7 by 0.6 cm hypodense lesion in the dome of the right hepatic lobe on image 9 of series 2 is technically nonspecific although statistically likely to be benign. Similar 0.8 by 0.5 cm lesion in the lateral segment left hepatic lobe on image 14 of series 2. Cholecystectomy noted. Common bile duct measures up to 0.9 cm, mildly prominent but probably a physiologic response to prior cholecystectomy. Pancreas: Unremarkable Spleen: Unremarkable Adrenals/Urinary Tract: The adrenal glands appear normal. Prominent scarring in the right kidney upper pole posteriorly with adjacent calyceal diverticulum. Small hypodense lesions in the right kidney lower pole are technically too small to characterize although statistically likely to be cysts. Urinary bladder unremarkable. 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 The orally administered contrast makes its way through to the ascending colon. There is a notably inflamed segment of distal sigmoid colon measuring at least 10 cm in length, with considerable wall thickening and diverticulosis. 3.1 by 1.2 by 1.9 cm fluid density along the wall of the distal sigmoid colon could represent an intramural or paracolic abscess. No overtly free intraperitoneal gas is identified. The there is surrounding edema in the mesentery. Vascular/Lymphatic: Mild aortoiliac atherosclerotic vascular calcification. No pathologic adenopathy identified. Reproductive: Uterus absent.  Adnexa unremarkable. Other: No supplemental non-categorized findings. 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 Mild  dextroconvex lower lumbar scoliosis. Grade 1 degenerative anterolisthesis at L4-5. Lower lumbar spondylosis and degenerative disc disease resulting in mild left foraminal impingement at L4-5 and potentially at L5-S1. IMPRESSION: 1. Prominently inflamed segment of distal sigmoid colon measuring at least 10 cm in length, with considerable wall thickening and diverticulosis. There is a 3.1 by 1.2 by 1.9 cm fluid density along the wall of the distal sigmoid colon could represent an intramural or paracolic abscess. No overtly free intraperitoneal gas is identified. 2. Prominent scarring in the right kidney upper pole posteriorly with adjacent calyceal diverticulum. 3. 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 4. Lower lumbar spondylosis and degenerative disc disease resulting in mild left foraminal impingement at L4-5  and potentially at L5-S1. 5. Small hypodense lesions in the liver and right kidney lower pole are technically too small to characterize although statistically likely to be benign. 6. 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 7. Aortic atherosclerosis. Aortic Atherosclerosis (ICD10-I70.0). Electronically Signed   By: Gaylyn Rong M.D.   On: 08/15/2020 19:53    ROS - all of the below systems have been reviewed with the patient and positives are indicated with bold text General: chills, fever or night sweats Eyes: blurry vision or double vision ENT: epistaxis or sore throat Allergy/Immunology: itchy/watery eyes or nasal congestion Hematologic/Lymphatic: bleeding problems, blood clots or swollen lymph nodes Endocrine: temperature intolerance or unexpected weight changes Breast: new or changing breast lumps or nipple discharge Resp: cough, shortness of breath, or wheezing CV: chest pain or dyspnea on exertion GI: as per HPI GU: dysuria, trouble voiding, or hematuria MSK: joint pain or joint stiffness Neuro: TIA or stroke symptoms Derm: pruritus and skin  lesion changes Psych: anxiety and depression  PE Blood pressure 115/78, pulse 89, temperature 97.9 F (36.6 C), temperature source Oral, resp. rate 15, SpO2 100 %. Constitutional: NAD; conversant; no deformities Eyes: Moist conjunctiva; no lid lag; anicteric; PERRL Neck: Trachea midline; no thyromegaly Lungs: Normal respiratory effort; no tactile fremitus CV: RRR; no palpable thrills; no pitting edema GI: Abd TTP LLQ; no palpable hepatosplenomegaly MSK: Normal range of motion of extremities; no clubbing/cyanosis Psychiatric: Appropriate affect; alert and oriented x3 Lymphatic: No palpable cervical or axillary lymphadenopathy  Results for orders placed or performed during the hospital encounter of 08/16/20 (from the past 48 hour(s))  CBC with Differential/Platelet     Status: Abnormal   Collection Time: 08/16/20  2:59 PM  Result Value Ref Range   WBC 12.2 (H) 4.0 - 10.5 K/uL   RBC 3.56 (L) 3.87 - 5.11 MIL/uL   Hemoglobin 11.1 (L) 12.0 - 15.0 g/dL   HCT 13.2 (L) 44.0 - 10.2 %   MCV 95.2 80.0 - 100.0 fL   MCH 31.2 26.0 - 34.0 pg   MCHC 32.7 30.0 - 36.0 g/dL   RDW 72.5 36.6 - 44.0 %   Platelets 399 150 - 400 K/uL   nRBC 0.0 0.0 - 0.2 %   Neutrophils Relative % 82 %   Neutro Abs 9.9 (H) 1.7 - 7.7 K/uL   Lymphocytes Relative 8 %   Lymphs Abs 1.0 0.7 - 4.0 K/uL   Monocytes Relative 9 %   Monocytes Absolute 1.1 (H) 0.1 - 1.0 K/uL   Eosinophils Relative 1 %   Eosinophils Absolute 0.1 0.0 - 0.5 K/uL   Basophils Relative 0 %   Basophils Absolute 0.1 0.0 - 0.1 K/uL   Immature Granulocytes 0 %   Abs Immature Granulocytes 0.04 0.00 - 0.07 K/uL    Comment: Performed at [facility], [address], [place], [place] [address]  Basic metabolic panel     Status: Abnormal   Collection Time: 08/16/20  2:59 PM  Result Value Ref Range   Sodium 136 135 - 145 mmol/L   Potassium 3.7 3.5 - 5.1 mmol/L   Chloride 102 98 - 111 mmol/L   CO2 23 22 - 32 mmol/L   Glucose, Bld 102  (H) 70 - 99 mg/dL    Comment: Glucose reference range applies only to samples taken after fasting for at least 8 hours.   BUN 9 8 - 23 mg/dL   Creatinine, Ser 5.95 0.44 - 1.00 mg/dL   Calcium 8.6 (L)  8.9 - 10.3 mg/dL   GFR, Estimated >09 >38 mL/min    Comment: (NOTE) Calculated using the CKD-EPI Creatinine Equation (2021)    Anion gap 11 5 - 15    Comment: Performed at [facility], [address], [place], [place] [address]    CT Abdomen Pelvis W Contrast  Result Date: 08/15/2020 CLINICAL DATA:  Progressive lower abdominal pain with cramping and constipation along with intermittent fevers. EXAM: CT ABDOMEN AND PELVIS WITH CONTRAST TECHNIQUE: Multidetector CT imaging of the abdomen and pelvis was performed using the standard protocol following bolus administration of intravenous contrast. CONTRAST:  OMNIPAQUE IOHEXOL 300 MG/ML  SOLN COMPARISON:  None. FINDINGS: Lower chest: Unremarkable Hepatobiliary: 0.7 by 0.6 cm hypodense lesion in the dome of the right hepatic lobe on image 9 of series 2 is technically nonspecific although statistically likely to be benign. Similar 0.8 by 0.5 cm lesion in the lateral segment left hepatic lobe on image 14 of series 2. Cholecystectomy noted. Common bile duct measures up to 0.9 cm, mildly prominent but probably a physiologic response to prior cholecystectomy. Pancreas: Unremarkable Spleen: Unremarkable Adrenals/Urinary Tract: The adrenal glands appear normal. Prominent scarring in the right kidney upper pole posteriorly with adjacent calyceal diverticulum. Small hypodense lesions in the right kidney lower pole are technically too small to characterize although statistically likely to be cysts. Urinary bladder unremarkable. 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 The orally administered contrast makes its way through to the ascending colon. There is a notably inflamed segment of distal sigmoid colon  measuring at least 10 cm in length, with considerable wall thickening and diverticulosis. 3.1 by 1.2 by 1.9 cm fluid density along the wall of the distal sigmoid colon could represent an intramural or paracolic abscess. No overtly free intraperitoneal gas is identified. The there is surrounding edema in the mesentery. Vascular/Lymphatic: Mild aortoiliac atherosclerotic vascular calcification. No pathologic adenopathy identified. Reproductive: Uterus absent.  Adnexa unremarkable. Other: No supplemental non-categorized findings. 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 Mild dextroconvex lower lumbar scoliosis. Grade 1 degenerative anterolisthesis at L4-5. Lower lumbar spondylosis and degenerative disc disease resulting in mild left foraminal impingement at L4-5 and potentially at L5-S1. IMPRESSION: 1. Prominently inflamed segment of distal sigmoid colon measuring at least 10 cm in length, with considerable wall thickening and diverticulosis. There is a 3.1 by 1.2 by 1.9 cm fluid density along the wall of the distal sigmoid colon could represent an intramural or paracolic abscess. No overtly free intraperitoneal gas is identified. 2. Prominent scarring in the right kidney upper pole posteriorly with adjacent calyceal diverticulum. 3. 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 4. Lower lumbar spondylosis and degenerative disc disease resulting in mild left foraminal impingement at L4-5 and potentially at L5-S1. 5. Small hypodense lesions in the liver and right kidney lower pole are technically too small to characterize although statistically likely to be benign. 6. 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 7. Aortic atherosclerosis. Aortic Atherosclerosis (ICD10-I70.0). Electronically Signed   By: Gaylyn Rong M.D.   On: 08/15/2020 19:53     A/P: Alexa Rios is an 75 y.o. female with  diverticulitis not resolving with outpatient management.  CT scan reviewed and she appears to have an intramural abscess.  Would recommend bowel rest and IV abx for now with plans to repeat CT in 72h if symptoms are not improving   Vanita Panda, MD  Colorectal and  General Surgery [facility] Surgery

## 2020-08-16 NOTE — Progress Notes (Signed)
D/W on call surgery, who does not recommend IR drainage given the location of the abscess likely intramural. D/C IR consult.

## 2020-08-16 NOTE — ED Triage Notes (Signed)
Patient here from home reporting that she got a call from [facility] reporting abnormal CT. 2 weeks of n/v, constipation, and abd pain.

## 2020-08-16 NOTE — ED Provider Notes (Signed)
[facility]-EMERGENCY DEPT Provider Note   CSN: [id number] Arrival date & time: 08/16/20  1354     History Chief Complaint  Patient presents with  . Abdominal Pain  . Abscess  . Nausea  . Emesis    Alexa Rios is a 75 y.o. female.  HPI   Patient presents to the ED for admission for an abnormal CT scan.  Patient does have history of diverticulosis.  She also has history of recurrent constipation.  Patient indicated she was having worsening lower abdominal cramping and pain over the last several weeks.  She went to see her GI doctor yesterday.  She was seen by Mike Gip.  Patient's presentation was concerning for diverticulitis so she was started on antibiotics and arrangements were made for an outpatient CT scan.  Patient was called and instructed to come to the ED today because of the abnormal CT scan findings.  Patient denies any recent fevers.  She does continue to have increasing pain in the left lower abdomen.  Past Medical History:  Diagnosis Date  . Colon polyps   . Diverticulitis   . GERD (gastroesophageal reflux disease)   . History of chicken pox   . Hypothyroidism   . Mesenteric adenitis   . Migraine   . Status post dilation of esophageal narrowing   . Thyroid disease     Patient Active Problem List   Diagnosis Date Noted  . Chronic constipation 07/31/2020  . Trigeminal neuralgia of right side of face 03/15/2020  . Occipital neuralgia of right side 03/15/2020  . Diverticulosis 03/15/2020  . Migraine headache with aura 03/15/2020  . Hypothyroid 03/15/2020  . Environmental and seasonal allergies 03/15/2020  . Heartburn 03/15/2020    Past Surgical History:  Procedure Laterality Date  . BREAST BIOPSY Left 1976   benign per patient  . BREAST CYST ASPIRATION Right    30 + yrs ago  . CHOLECYSTECTOMY  2015  . COLONOSCOPY    . KIDNEY CYST REMOVAL    . REDUCTION MAMMAPLASTY Bilateral 2005  . TONSILLECTOMY AND ADENOIDECTOMY   1951  . VAGINAL HYSTERECTOMY  2000   benign path     OB History   No obstetric history on file.     Family History  Problem Relation Age of Onset  . Rheum arthritis Mother   . Hyperlipidemia Mother   . Stroke Mother   . Lymphoma Father   . Diabetes Father   . Early death Father   . Hyperlipidemia Father   . Cancer Brother        ureteral  . Diabetes Brother   . Hyperlipidemia Brother   . Arthritis Sister   . Hyperlipidemia Sister   . Heart disease Maternal Grandmother   . High Cholesterol Maternal Grandmother   . Heart disease Paternal Grandmother   . High Cholesterol Paternal Grandmother   . Prostate cancer Paternal Grandfather   . Stroke Paternal Grandfather   . Colon cancer Neg Hx   . Pancreatic cancer Neg Hx   . Esophageal cancer Neg Hx     Social History   Tobacco Use  . Smoking status: Never Smoker  . Smokeless tobacco: Never Used  Vaping Use  . Vaping Use: Never used  Substance Use Topics  . Alcohol use: Not Currently  . Drug use: Never    Home Medications Prior to Admission medications   Medication Sig Start Date End Date Taking? Authorizing Provider  AMBULATORY NON FORMULARY MEDICATION Medication Name: Diltiazem  2% mixed with Lidocaine 5%  Apply a pea size amount to rectum three times a day Patient not taking: Reported on 08/14/2020 08/12/20   Iva Boop, MD  BIOTIN 5000 PO Take 1 tablet by mouth in the morning and at bedtime.     [provider]  CALCIUM PO Take 1,200 mg by mouth daily.    [provider]  ciprofloxacin (CIPRO) 500 MG tablet Take 1 tablet (500 mg total) by mouth 2 (two) times daily for 7 days. 08/14/20 08/21/20  Esterwood, Amy S, PA-C  dicyclomine (BENTYL) 10 MG capsule Take 1 capsule three times a day as need. 08/14/20   Esterwood, Amy S, PA-C  Esomeprazole Magnesium (NEXIUM 24HR) 20 MG TBEC Take 1 tablet by mouth daily. Patient not taking: Reported on 08/14/2020    [provider]  Estradiol 1 MG/GM GEL  Apply 1mg  twice a week as needed 04/09/20   Koberlein, 04/11/20, MD  EVENING PRIMROSE OIL PO Take 500 mg by mouth daily.     [provider]  gabapentin (NEURONTIN) 100 MG capsule Take 2 capsules (200 mg total) by mouth at bedtime. Patient not taking: Reported on 08/14/2020 03/26/20   05/26/20, MD  Glucosamine-Chondroitin (COSAMIN DS PO) Take by mouth.    [provider]  levothyroxine (SYNTHROID) 25 MCG tablet Take 1 tablet (25 mcg total) by mouth daily before breakfast. 11/21/19   Koberlein, 01/21/20, MD  lubiprostone (AMITIZA) 8 MCG capsule Take 1 capsule (8 mcg total) by mouth 2 (two) times daily with a meal. Patient not taking: Reported on 08/14/2020 08/07/20   08/09/20, MD  Magnesium Hydroxide (MILK OF MAGNESIA PO) Take by mouth.    [provider]  metroNIDAZOLE (FLAGYL) 500 MG tablet Take 1 tablet (500 mg total) by mouth 2 (two) times daily for 7 days. 08/14/20 08/21/20  Esterwood, Amy S, PA-C  Multiple Vitamin (MULTIVITAMIN ADULT PO) Take by mouth.    [provider]  Potassium 99 MG TABS Take 1 tablet by mouth daily.    [provider]  Probiotic Product (PROBIOTIC PO) Take by mouth. Patient not taking: Reported on 08/14/2020    [provider]  UBIQUINOL PO Take 50 mg by mouth daily.    [provider]  Ubrogepant (UBRELVY) 50 MG TABS Take 50 mg by mouth once as needed for up to 1 dose. 03/14/20   03/16/20, MD    Allergies    Miralax [polyethylene glycol 3350], Sulfamethoxazole-trimethoprim, and Sumatriptan  Review of Systems   Review of Systems  Physical Exam Updated Vital Signs BP 119/70   Pulse 89   Temp 97.9 F (36.6 C) (Oral)   Resp 19   SpO2 100%   Physical Exam  ED Results / Procedures / Treatments   Labs (all labs ordered are listed, but only abnormal results are displayed) Labs Reviewed  SARS CORONAVIRUS 2 (TAT 6-24 HRS)  CBC WITH DIFFERENTIAL/PLATELET  BASIC METABOLIC PANEL     EKG None  Radiology CT Abdomen Pelvis W Contrast  Result Date: 08/15/2020 CLINICAL DATA:  Progressive lower abdominal pain with cramping and constipation along with intermittent fevers. EXAM: CT ABDOMEN AND PELVIS WITH CONTRAST TECHNIQUE: Multidetector CT imaging of the abdomen and pelvis was performed using the standard protocol following bolus administration of intravenous contrast. CONTRAST:  08/17/2020 OMNIPAQUE IOHEXOL 300 MG/ML  SOLN COMPARISON:  None. FINDINGS: Lower chest: Unremarkable Hepatobiliary: 0.7 by 0.6 cm hypodense lesion in the dome of the  right hepatic lobe on image 9 of series 2 is technically nonspecific although statistically likely to be benign. Similar 0.8 by 0.5 cm lesion in the lateral segment left hepatic lobe on image 14 of series 2. Cholecystectomy noted. Common bile duct measures up to 0.9 cm, mildly prominent but probably a physiologic response to prior cholecystectomy. Pancreas: Unremarkable Spleen: Unremarkable Adrenals/Urinary Tract: The adrenal glands appear normal. Prominent scarring in the right kidney upper pole posteriorly with adjacent calyceal diverticulum. Small hypodense lesions in the right kidney lower pole are technically too small to characterize although statistically likely to be cysts. Urinary bladder unremarkable. 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 The orally administered contrast makes its way through to the ascending colon. There is a notably inflamed segment of distal sigmoid colon measuring at least 10 cm in length, with considerable wall thickening and diverticulosis. 3.1 by 1.2 by 1.9 cm fluid density along the wall of the distal sigmoid colon could represent an intramural or paracolic abscess. No overtly free intraperitoneal gas is identified. The there is surrounding edema in the mesentery. Vascular/Lymphatic: Mild aortoiliac atherosclerotic vascular calcification. No pathologic adenopathy identified.  Reproductive: Uterus absent.  Adnexa unremarkable. Other: No supplemental non-categorized findings. 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 Mild dextroconvex lower lumbar scoliosis. Grade 1 degenerative anterolisthesis at L4-5. Lower lumbar spondylosis and degenerative disc disease resulting in mild left foraminal impingement at L4-5 and potentially at L5-S1. IMPRESSION: 1. Prominently inflamed segment of distal sigmoid colon measuring at least 10 cm in length, with considerable wall thickening and diverticulosis. There is a 3.1 by 1.2 by 1.9 cm fluid density along the wall of the distal sigmoid colon could represent an intramural or paracolic abscess. No overtly free intraperitoneal gas is identified. 2. Prominent scarring in the right kidney upper pole posteriorly with adjacent calyceal diverticulum. 3. 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 4. Lower lumbar spondylosis and degenerative disc disease resulting in mild left foraminal impingement at L4-5 and potentially at L5-S1. 5. Small hypodense lesions in the liver and right kidney lower pole are technically too small to characterize although statistically likely to be benign. 6. 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 7. Aortic atherosclerosis. Aortic Atherosclerosis (ICD10-I70.0). Electronically Signed   By: Gaylyn Rong M.D.   On: 08/15/2020 19:53    Procedures Procedures   Medications Ordered in ED Medications  morphine 2 MG/ML injection 2 mg (2 mg Intravenous Not Given 08/16/20 1503)  sodium chloride 0.9 % bolus 500 mL (500 mLs Intravenous New Bag/Given 08/16/20 1455)    Followed by  0.9 %  sodium chloride infusion (1,000 mLs Intravenous New Bag/Given 08/16/20 1455)  ceFEPIme (MAXIPIME) 2 g in sodium chloride 0.9 % 100 mL IVPB (has no administration in time range)    And  metroNIDAZOLE (FLAGYL) IVPB 500 mg (500 mg  Intravenous New Bag/Given 08/16/20 1503)  ondansetron (ZOFRAN) injection 4 mg (4 mg Intravenous Given 08/16/20 1459)    ED Course  I have reviewed the triage vital signs and the nursing notes.  Pertinent labs & imaging results that were available during my care of the patient were reviewed by me and considered in my medical decision making (see chart for details).    MDM Rules/Calculators/A&P                          Patient presents ED for further treatment of an outpatient CT scan  that demonstrated diverticulitis with adjacent fluid collection concerning either for a paracolic or intramural abscess.  Patient is hemodynamically stable in the ED.  She is afebrile.  No signs of sepsis.  Outpatient CT scan reviewed.  Have ordered IV antibiotics.  She is noticing about her GI.  I will consult the medical service for admission Final Clinical Impression(s) / ED Diagnoses Final diagnoses:  Diverticulitis      Linwood Dibbles, MD 08/16/20 [phone]

## 2020-08-16 NOTE — Progress Notes (Signed)
PHARMACIST - PHYSICIAN ORDER COMMUNICATION  CONCERNING: P&T Medication Policy on Herbal Medications  DESCRIPTION:  This patient's order for:  glucosamine-chondroitin  has been noted.  This product(s) is classified as an "herbal" or natural product. Due to a lack of definitive safety studies or FDA approval, nonstandard manufacturing practices, plus the potential risk of unknown drug-drug interactions while on inpatient medications, the Pharmacy and Therapeutics Committee does not permit the use of "herbal" or natural products of this type within [facility].   ACTION TAKEN: The pharmacy department is unable to verify this order at this time and your patient has been informed of this safety policy. Please reevaluate patient's clinical condition at discharge and address if the herbal or natural product(s) should be resumed at that time.  Dorna Leitz, PharmD, BCPS 08/16/2020 7:34 PM

## 2020-08-16 NOTE — Telephone Encounter (Signed)
Pt in pain and weak - had seen CT result and mutual friend contacted me - I called her and recommended she go tro ED for adnission and Tx - this has been dobe

## 2020-08-17 DIAGNOSIS — K5792 Diverticulitis of intestine, part unspecified, without perforation or abscess without bleeding: Secondary | ICD-10-CM | POA: Diagnosis not present

## 2020-08-17 LAB — BASIC METABOLIC PANEL
Anion gap: 10 (ref 5–15)
BUN: <5 mg/dL — ABNORMAL LOW (ref 8–23)
CO2: 22 mmol/L (ref 22–32)
Calcium: 8.2 mg/dL — ABNORMAL LOW (ref 8.9–10.3)
Chloride: 103 mmol/L (ref 98–111)
Creatinine, Ser: 0.66 mg/dL (ref 0.44–1.00)
GFR, Estimated: >60 mL/min (ref >60)
Glucose, Bld: 113 mg/dL — ABNORMAL HIGH (ref 70–99)
Potassium: 3.7 mmol/L (ref 3.5–5.1)
Sodium: 135 mmol/L (ref 135–145)

## 2020-08-17 LAB — URINALYSIS, ROUTINE W REFLEX MICROSCOPIC
Bilirubin Urine: NEGATIVE
Glucose, UA: NEGATIVE mg/dL
Hgb urine dipstick: NEGATIVE
Ketones, ur: NEGATIVE mg/dL
Leukocytes,Ua: NEGATIVE
Nitrite: NEGATIVE
Protein, ur: NEGATIVE mg/dL
Specific Gravity, Urine: 1.004 — ABNORMAL LOW (ref 1.005–1.030)
pH: 6 (ref 5.0–8.0)

## 2020-08-17 LAB — CBC
HCT: 33.3 % — ABNORMAL LOW (ref 36.0–46.0)
Hemoglobin: 10.7 g/dL — ABNORMAL LOW (ref 12.0–15.0)
MCH: 30.8 pg (ref 26.0–34.0)
MCHC: 32.1 g/dL (ref 30.0–36.0)
MCV: 96 fL (ref 80.0–100.0)
Platelets: 389 10*3/uL (ref 150–400)
RBC: 3.47 MIL/uL — ABNORMAL LOW (ref 3.87–5.11)
RDW: 12.4 % (ref 11.5–15.5)
WBC: 9.6 10*3/uL (ref 4.0–10.5)
nRBC: 0 % (ref 0.0–0.2)

## 2020-08-17 MED ORDER — MELATONIN 5 MG PO TABS
5.0000 mg | ORAL_TABLET | Freq: Once | ORAL | Status: AC
  Administered 2020-08-17: 5 mg via ORAL
  Filled 2020-08-17: qty 1

## 2020-08-17 MED ORDER — METRONIDAZOLE IN NACL 5-0.79 MG/ML-% IV SOLN
500.0000 mg | Freq: Three times a day (TID) | INTRAVENOUS | Status: DC
  Administered 2020-08-17 – 2020-08-20 (×10): 500 mg via INTRAVENOUS
  Filled 2020-08-17 (×10): qty 100

## 2020-08-17 NOTE — Progress Notes (Signed)
Subjective/Chief Complaint: Patient had a BM this morning Complaining of crampy LLQ/ lower abdominal pain Afebrile Labs pending   Objective: Vital signs in last 24 hours: Temp:  [97.9 F (36.6 C)-99.5 F (37.5 C)] 98.2 F (36.8 C) (01/30 0457) Pulse Rate:  [80-96] 84 (01/30 0457) Resp:  [15-20] 20 (01/30 0457) BP: (110-126)/(69-79) 110/78 (01/30 0457) SpO2:  [96 %-100 %] 97 % (01/30 0457) Last BM Date: 08/17/20  Intake/Output from previous day: 01/29 0701 - 01/30 0700 In: 2655.8 [I.V.:1955.8; IV Piggyback:700] Out: 900 [Urine:900] Intake/Output this shift: No intake/output data recorded.  WDWN in NAD Abd - soft, minimal distention; tender mostly in LLQ, but also across suprapubic region  Lab Results:  Recent Labs    08/14/20 1503 08/16/20 1459  WBC 10.3 12.2*  HGB 11.7* 11.1*  HCT 34.4* 33.9*  PLT 378.0 399   BMET Recent Labs    08/14/20 1503 08/16/20 1459  NA 133* 136  K 4.4 3.7  CL 98 102  CO2 29 23  GLUCOSE 96 102*  BUN 8 9  CREATININE 0.64 0.65  CALCIUM 9.5 8.6*   PT/INR No results for input(s): LABPROT, INR in the last 72 hours. ABG No results for input(s): PHART, HCO3 in the last 72 hours.  Invalid input(s): PCO2, PO2  Studies/Results: CT Abdomen Pelvis W Contrast  Result Date: 08/15/2020 CLINICAL DATA:  Progressive lower abdominal pain with cramping and constipation along with intermittent fevers. EXAM: CT ABDOMEN AND PELVIS WITH CONTRAST TECHNIQUE: Multidetector CT imaging of the abdomen and pelvis was performed using the standard protocol following bolus administration of intravenous contrast. CONTRAST:  OMNIPAQUE IOHEXOL 300 MG/ML  SOLN COMPARISON:  None. FINDINGS: Lower chest: Unremarkable Hepatobiliary: 0.7 by 0.6 cm hypodense lesion in the dome of the right hepatic lobe on image 9 of series 2 is technically nonspecific although statistically likely to be benign. Similar 0.8 by 0.5 cm lesion in the lateral segment left hepatic  lobe on image 14 of series 2. Cholecystectomy noted. Common bile duct measures up to 0.9 cm, mildly prominent but probably a physiologic response to prior cholecystectomy. Pancreas: Unremarkable Spleen: Unremarkable Adrenals/Urinary Tract: The adrenal glands appear normal. Prominent scarring in the right kidney upper pole posteriorly with adjacent calyceal diverticulum. Small hypodense lesions in the right kidney lower pole are technically too small to characterize although statistically likely to be cysts. Urinary bladder unremarkable. 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 The orally administered contrast makes its way through to the ascending colon. There is a notably inflamed segment of distal sigmoid colon measuring at least 10 cm in length, with considerable wall thickening and diverticulosis. 3.1 by 1.2 by 1.9 cm fluid density along the wall of the distal sigmoid colon could represent an intramural or paracolic abscess. No overtly free intraperitoneal gas is identified. The there is surrounding edema in the mesentery. Vascular/Lymphatic: Mild aortoiliac atherosclerotic vascular calcification. No pathologic adenopathy identified. Reproductive: Uterus absent.  Adnexa unremarkable. Other: No supplemental non-categorized findings. 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 Mild dextroconvex lower lumbar scoliosis. Grade 1 degenerative anterolisthesis at L4-5. Lower lumbar spondylosis and degenerative disc disease resulting in mild left foraminal impingement at L4-5 and potentially at L5-S1. IMPRESSION: 1. Prominently inflamed segment of distal sigmoid colon measuring at least 10 cm in length, with considerable wall thickening and diverticulosis. There is a 3.1 by 1.2 by 1.9 cm fluid density along the wall of the distal sigmoid colon could represent  an intramural or paracolic abscess. No  overtly free intraperitoneal gas is identified. 2. Prominent scarring in the right kidney upper pole posteriorly with adjacent calyceal diverticulum. 3. 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 4. Lower lumbar spondylosis and degenerative disc disease resulting in mild left foraminal impingement at L4-5 and potentially at L5-S1. 5. Small hypodense lesions in the liver and right kidney lower pole are technically too small to characterize although statistically likely to be benign. 6. 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 7. Aortic atherosclerosis. Aortic Atherosclerosis (ICD10-I70.0). Electronically Signed   By: Gaylyn Rong M.D.   On: 08/15/2020 19:53    Anti-infectives: Anti-infectives (From admission, onward)   Start     Dose/Rate Route Frequency Ordered Stop   08/16/20 2200  metroNIDAZOLE (FLAGYL) tablet 500 mg        500 mg Oral Every 8 hours 08/16/20 1742     08/16/20 1800  cefTRIAXone (ROCEPHIN) 2 g in sodium chloride 0.9 % 100 mL IVPB        2 g 200 mL/hr over 30 Minutes Intravenous Every 24 hours 08/16/20 1703     08/16/20 1800  metroNIDAZOLE (FLAGYL) tablet 500 mg  Status:  Discontinued        500 mg Oral Every 8 hours 08/16/20 1703 08/16/20 1742   08/16/20 1430  ceFEPIme (MAXIPIME) 2 g in sodium chloride 0.9 % 100 mL IVPB  Status:  Discontinued       "And" Linked Group Details   2 g 200 mL/hr over 30 Minutes Intravenous  Once 08/16/20 1418 08/16/20 1705   08/16/20 1430  metroNIDAZOLE (FLAGYL) IVPB 500 mg       "And" Linked Group Details   500 mg 100 mL/hr over 60 Minutes Intravenous  Once 08/16/20 1418 08/16/20 1709      Assessment/Plan: Sigmoid diverticulitis - not responsive to outpatient treatment; 3.1 x 1.2 x 1.9 cm intramural abscess.  Recs:   Continue bowel rest - sips of clear liquids; do not advance IV antibiotics/ IV hydration Follow labs/ physical exam - may need follow-up CT Tuesday or Wednesday if no significant  improvement.   LOS: 1 day    Wynona Luna 08/17/2020

## 2020-08-17 NOTE — Progress Notes (Signed)
RN admit note: report rec'ed from [facility] in the ED at 0020, pt arrived to unit at 00:45 via stretcher, NT at bedside. Pt ambulated from stretcher to bed, gait stable. Oriented to unit and room, see flowsheets for VS and physical assessment. Pt made aware of covid negative and [place] covid policy. Advised on falls risk, advised to call for assistance when needed. Instructed how to use call bed and made pt aware of plan per MD order. Verbalized understanding of teaching. See flowsheet for pain assessment. No distress noted. Will continue to monitor pt. Closely. Erle Crocker RN

## 2020-08-17 NOTE — Plan of Care (Signed)
Plan of care initiated. Erle Crocker, RN

## 2020-08-17 NOTE — Progress Notes (Signed)
PROGRESS NOTE   Alexa Rios  [id number]:[id number] DOB: 1945-08-26 DOA: 08/16/2020 PCP: Wynn Banker, MD  Brief Narrative:  75 year old white female Known history of dysphagia followed by [place] [facility]-+ duodenal diverticulum tolerated PPI Prior trigeminal neuralgia hypothyroidism migraines Prior diverticulitis with mesenteric panniculitis followed at [facility] previously  Seen in the GI office 08/15/2019 3-week history of progressive lower abdominal pain cramping started on Cipro Flagyl and Bentyl CT was performed showing acute diverticulitis with possible sigmoid colon intramural abscess White count was 12.12 Started on IV antibiotics General surgery consulted   Assessment & Plan:   Active Problems:   Diverticulitis   1. Intramural colonic abscess secondary to failed outpatient management diverticulosis Cipro a. Not surgical candidate currently b. Continue ceftriaxone Flagyl and saline 125 cc/H c. Defer to general surgery diet and graduation etc. and further imaging 2. Trigeminal neuralgia a. Currently not on any specific benzodiazepine-seems controlled 3. Migraine a. Monitor continue Ubrogepant 4. Hypothyroid a. Continue Synthroid 25 mcg daily b. Outpatient TSH in 3 to 4 weeks 5. Osteopenia a. Continue calcium 1200 daily  DVT prophylaxis: Lovenox Code Status: Presumed full Family Communication: None present at the bedside Disposition:  Status is: Inpatient not ready for discharge-we will need several days and graduation of diet prior to decision making  Remains inpatient appropriate because:Unsafe d/c plan and IV treatments appropriate due to intensity of illness or inability to take PO   Dispo: The patient is from: Home              Anticipated d/c is to: Home              Anticipated d/c date is: 3 days              Patient currently is not medically stable to d/c.   Difficult to place patient No    Consultants:   General  surgery  Procedures: None  Antimicrobials: Ceftriaxone Flagyl   Subjective: Doing fair rating pain as 4/10 but not using meds Felt nauseous this morning   Objective: Vitals:   08/17/20 0000 08/17/20 0051 08/17/20 0457 08/17/20 0710  BP: 114/69 114/72 110/78   Pulse: 80 84 84   Resp: 18 16 20    Temp:  98.5 F (36.9 C) 98.2 F (36.8 C)   TempSrc:  Oral Oral Oral  SpO2: 98% 97% 97%     Intake/Output Summary (Last 24 hours) at 08/17/2020 0759 Last data filed at 08/17/2020 0650 Gross per 24 hour  Intake 2655.84 ml  Output 900 ml  Net 1755.84 ml   There were no vitals filed for this visit.  Examination:  Awake alert coherent no distress EOMI NCAT no focal deficit Coherent alert pleasant Chest clear no added sound S1-S2 no murmur Slight tenderness right lower quadrant No lower extremity swelling Power 5/5  Data Reviewed: I have personally reviewed following labs and imaging studies  BUNs/creatinine 5/0.6 White count down from 12.2-9.6 Hemoglobin 10.1-->10.7  COVID-19 Labs  No results for input(s): DDIMER, FERRITIN, LDH, CRP in the last 72 hours.  Lab Results  Component Value Date   SARSCOV2NAA NEGATIVE 08/16/2020     Radiology Studies: CT Abdomen Pelvis W Contrast  Result Date: 08/15/2020 CLINICAL DATA:  Progressive lower abdominal pain with cramping and constipation along with intermittent fevers. EXAM: CT ABDOMEN AND PELVIS WITH CONTRAST TECHNIQUE: Multidetector CT imaging of the abdomen and pelvis was performed using the standard protocol following bolus administration of intravenous contrast. CONTRAST:  08/17/2020 OMNIPAQUE IOHEXOL 300 MG/ML  SOLN COMPARISON:  None. FINDINGS: Lower chest: Unremarkable Hepatobiliary: 0.7 by 0.6 cm hypodense lesion in the dome of the right hepatic lobe on image 9 of series 2 is technically nonspecific although statistically likely to be benign. Similar 0.8 by 0.5 cm lesion in the lateral segment left hepatic lobe on image 14 of  series 2. Cholecystectomy noted. Common bile duct measures up to 0.9 cm, mildly prominent but probably a physiologic response to prior cholecystectomy. Pancreas: Unremarkable Spleen: Unremarkable Adrenals/Urinary Tract: The adrenal glands appear normal. Prominent scarring in the right kidney upper pole posteriorly with adjacent calyceal diverticulum. Small hypodense lesions in the right kidney lower pole are technically too small to characterize although statistically likely to be cysts. Urinary bladder unremarkable. 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 The orally administered contrast makes its way through to the ascending colon. There is a notably inflamed segment of distal sigmoid colon measuring at least 10 cm in length, with considerable wall thickening and diverticulosis. 3.1 by 1.2 by 1.9 cm fluid density along the wall of the distal sigmoid colon could represent an intramural or paracolic abscess. No overtly free intraperitoneal gas is identified. The there is surrounding edema in the mesentery. Vascular/Lymphatic: Mild aortoiliac atherosclerotic vascular calcification. No pathologic adenopathy identified. Reproductive: Uterus absent.  Adnexa unremarkable. Other: No supplemental non-categorized findings. 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 Mild dextroconvex lower lumbar scoliosis. Grade 1 degenerative anterolisthesis at L4-5. Lower lumbar spondylosis and degenerative disc disease resulting in mild left foraminal impingement at L4-5 and potentially at L5-S1. IMPRESSION: 1. Prominently inflamed segment of distal sigmoid colon measuring at least 10 cm in length, with considerable wall thickening and diverticulosis. There is a 3.1 by 1.2 by 1.9 cm fluid density along the wall of the distal sigmoid colon could represent an intramural or paracolic abscess. No overtly free  intraperitoneal gas is identified. 2. Prominent scarring in the right kidney upper pole posteriorly with adjacent calyceal diverticulum. 3. 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 4. Lower lumbar spondylosis and degenerative disc disease resulting in mild left foraminal impingement at L4-5 and potentially at L5-S1. 5. Small hypodense lesions in the liver and right kidney lower pole are technically too small to characterize although statistically likely to be benign. 6. 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 7. Aortic atherosclerosis. Aortic Atherosclerosis (ICD10-I70.0). Electronically Signed   By: Gaylyn Rong M.D.   On: 08/15/2020 19:53     Scheduled Meds: . enoxaparin (LOVENOX) injection  40 mg Subcutaneous Q24H  . levothyroxine  25 mcg Oral QAC breakfast  . polyethylene glycol  17 g Oral TID   Continuous Infusions: . sodium chloride 125 mL/hr at 08/17/20 0731  . cefTRIAXone (ROCEPHIN)  IV Stopped (08/16/20 1900)     LOS: 1 day    Time spent: 25  Rhetta Mura, MD [facility] To contact the attending provider between 7A-7P or the covering provider during after hours 7P-7A, please log into the web site [email or web address] and access using universal [place] password for that web site. If you do not have the password, please call the hospital operator.  08/17/2020, 7:59 AM

## 2020-08-18 DIAGNOSIS — K5792 Diverticulitis of intestine, part unspecified, without perforation or abscess without bleeding: Secondary | ICD-10-CM | POA: Diagnosis not present

## 2020-08-18 LAB — COMPREHENSIVE METABOLIC PANEL
ALT: 10 U/L (ref 0–44)
AST: 14 U/L — ABNORMAL LOW (ref 15–41)
Albumin: 2.9 g/dL — ABNORMAL LOW (ref 3.5–5.0)
Alkaline Phosphatase: 63 U/L (ref 38–126)
Anion gap: 9 (ref 5–15)
BUN: <5 mg/dL — ABNORMAL LOW (ref 8–23)
CO2: 22 mmol/L (ref 22–32)
Calcium: 8.1 mg/dL — ABNORMAL LOW (ref 8.9–10.3)
Chloride: 102 mmol/L (ref 98–111)
Creatinine, Ser: 0.52 mg/dL (ref 0.44–1.00)
GFR, Estimated: >60 mL/min (ref >60)
Glucose, Bld: 107 mg/dL — ABNORMAL HIGH (ref 70–99)
Potassium: 3.2 mmol/L — ABNORMAL LOW (ref 3.5–5.1)
Sodium: 133 mmol/L — ABNORMAL LOW (ref 135–145)
Total Bilirubin: 0.3 mg/dL (ref 0.3–1.2)
Total Protein: 5.6 g/dL — ABNORMAL LOW (ref 6.5–8.1)

## 2020-08-18 LAB — CBC
HCT: 29.5 % — ABNORMAL LOW (ref 36.0–46.0)
Hemoglobin: 9.6 g/dL — ABNORMAL LOW (ref 12.0–15.0)
MCH: 31.1 pg (ref 26.0–34.0)
MCHC: 32.5 g/dL (ref 30.0–36.0)
MCV: 95.5 fL (ref 80.0–100.0)
Platelets: 351 10*3/uL (ref 150–400)
RBC: 3.09 MIL/uL — ABNORMAL LOW (ref 3.87–5.11)
RDW: 12.4 % (ref 11.5–15.5)
WBC: 8.7 10*3/uL (ref 4.0–10.5)
nRBC: 0 % (ref 0.0–0.2)

## 2020-08-18 MED ORDER — SODIUM CHLORIDE 0.9 % IV SOLN: 1000.0000 mL | INTRAVENOUS | Status: DC

## 2020-08-18 MED ORDER — SALINE SPRAY 0.65 % NA SOLN
1.0000 | NASAL | Status: DC | PRN
  Filled 2020-08-18: qty 44

## 2020-08-18 MED ORDER — BOOST / RESOURCE BREEZE PO LIQD CUSTOM
1.0000 | Freq: Two times a day (BID) | ORAL | Status: DC
  Administered 2020-08-20: 1 via ORAL

## 2020-08-18 MED ORDER — SACCHAROMYCES BOULARDII 250 MG PO CAPS
250.0000 mg | ORAL_CAPSULE | Freq: Two times a day (BID) | ORAL | Status: DC
  Administered 2020-08-18 – 2020-08-20 (×5): 250 mg via ORAL
  Filled 2020-08-18 (×5): qty 1

## 2020-08-18 MED ORDER — POLYETHYLENE GLYCOL 3350 17 G PO PACK: 17.0000 g | PACK | Freq: Every day | ORAL | Status: DC | PRN

## 2020-08-18 MED ORDER — PROMETHAZINE HCL 25 MG/ML IJ SOLN
12.5000 mg | Freq: Four times a day (QID) | INTRAMUSCULAR | Status: DC | PRN
  Administered 2020-08-18: 12.5 mg via INTRAVENOUS
  Filled 2020-08-18: qty 1

## 2020-08-18 NOTE — Progress Notes (Signed)
PROGRESS NOTE   Alexa Rios  [id number]:[id number] DOB: 03/10/1946 DOA: 08/16/2020 PCP: Wynn Banker, MD  Brief Narrative:  75 year old white female Known history of dysphagia followed by [place] [facility]-+ duodenal diverticulum tolerated PPI Prior trigeminal neuralgia hypothyroidism migraines Prior diverticulitis with mesenteric panniculitis followed at [facility] previously  Seen in the GI office 08/15/2019 3-week history of progressive lower abdominal pain cramping started on Cipro Flagyl and Bentyl CT was performed showing acute diverticulitis with possible sigmoid colon intramural abscess White count was 12.12 Started on IV antibiotics General surgery consulted   Assessment & Plan:   Active Problems:   Diverticulitis   1. Intramural colonic abscess secondary to failed outpatient management diverticulosis Cipro a. Not surgical candidate currently b. Continue ceftriaxone Flagyl, fluids to 80 cc an hour c. ?  Imaging prior to discharge 2. Trigeminal neuralgia a. Currently not on any specific benzodiazepine-seems controlled 3. Migraine a. Monitor continue Ubrogepant b. Considering Reglan addition to help with migraine symptoms although may cause further diarrhea c. Revisit in a.m. 4. Hypothyroid a. Continue Synthroid 25 mcg daily b. Outpatient TSH in 3 to 4 weeks 5. Osteopenia a. Continue calcium 1200 daily  DVT prophylaxis: Lovenox Code Status: Presumed full Family Communication: None present at the bedside Disposition:  Status is: Inpatient not ready for discharge-we will need several days and graduation of diet prior to decision making  Remains inpatient appropriate because:Unsafe d/c plan and IV treatments appropriate due to intensity of illness or inability to take PO   Dispo: The patient is from: Home              Anticipated d/c is to: Home              Anticipated d/c date is: 3 days              Patient currently is not medically stable to  d/c.   Difficult to place patient No    Consultants:   General surgery  Procedures: None  Antimicrobials: Ceftriaxone Flagyl   Subjective:  Several episodes of nausea with continued diarrhea but taking MiraLAX No chest pain no fever Abdominal pain is minimal   Objective: Vitals:   08/17/20 1413 08/17/20 1954 08/18/20 0551 08/18/20 1241  BP: 118/76 110/70 115/75 105/67  Pulse: 79 85 81 71  Resp: 15 18 20 16   Temp: 98.1 F (36.7 C) 98.4 F (36.9 C) 98.9 F (37.2 C) 98.2 F (36.8 C)  TempSrc: Oral Oral Oral Oral  SpO2: 99% 95% 98% 99%  Weight:      Height:        Intake/Output Summary (Last 24 hours) at 08/18/2020 1300 Last data filed at 08/18/2020 0601 Gross per 24 hour  Intake 4405 ml  Output --  Net 4405 ml   Filed Weights   08/17/20 1306  Weight: 62.4 kg    Examination:  Pleasant coherent no distress EOMI NCAT no focal deficit CTA B no added sound Abdomen soft nontender Mild upper extremity edema  Data Reviewed: I have personally reviewed following labs and imaging studies  BUNs/creatinine 5/0.5  potassium 3.2 White count down from 12.2-9.6--> 8.7 Hemoglobin 10.1-->10.7--> 9.6  COVID-19 Labs  No results for input(s): DDIMER, FERRITIN, LDH, CRP in the last 72 hours.  Lab Results  Component Value Date   SARSCOV2NAA NEGATIVE 08/16/2020     Radiology Studies: No results found.   Scheduled Meds: . enoxaparin (LOVENOX) injection  40 mg Subcutaneous Q24H  . feeding supplement  1 Container Oral  BID BM  . levothyroxine  25 mcg Oral QAC breakfast  . saccharomyces boulardii  250 mg Oral BID   Continuous Infusions: . sodium chloride 1,000 mL (08/18/20 0857)  . cefTRIAXone (ROCEPHIN)  IV Stopped (08/17/20 1723)  . metronidazole Stopped (08/18/20 0550)     LOS: 2 days    Time spent: 15  Rhetta Mura, MD [facility] To contact the attending provider between 7A-7P or the covering provider during after hours 7P-7A, please  log into the web site [email or web address] and access using universal [place] password for that web site. If you do not have the password, please call the hospital operator.  08/18/2020, 1:00 PM

## 2020-08-18 NOTE — Telephone Encounter (Signed)
Thank you :)

## 2020-08-18 NOTE — Progress Notes (Signed)
    GI Courtesy Note  She is improving though having nausea and vomiting (migraine +/- metronidazole)   I suspect colonoscopy will be in order once fully recovered.  Appreciate TRH and CCS help.  Iva Boop, MD, [facility] 08/18/2020 10:01 AM

## 2020-08-18 NOTE — Progress Notes (Signed)
[facility] Progress Note     Subjective: CC-  Main complaint is migraine. States that she has a severe headache and nausea from this, no emesis. On ubrelvy for this and took some last night. Tolerating clear liquids. States that she is hungry and her abdominal pain is significantly improved. States that she had no abdominal pain last night. She has had 2 BMs early this morning which caused some abdominal discomfort. PO intake does not worsen abdominal pain. WBC normalized 8.7, afebrile.  Objective: Vital signs in last 24 hours: Temp:  [98.1 F (36.7 C)-98.9 F (37.2 C)] 98.9 F (37.2 C) (01/31 0551) Pulse Rate:  [79-85] 81 (01/31 0551) Resp:  [15-20] 20 (01/31 0551) BP: (110-118)/(70-76) 115/75 (01/31 0551) SpO2:  [95 %-99 %] 98 % (01/31 0551) Weight:  [62.4 kg] 62.4 kg (01/30 1306) Last BM Date: 08/17/20  Intake/Output from previous day: 01/30 0701 - 01/31 0700 In: 5417 [P.O.:2892; I.V.:2225; IV Piggyback:300] Out: -  Intake/Output this shift: No intake/output data recorded.  PE: Gen:  Alert, NAD, pleasant Pulm:  rate and effort normal Abd: Soft, NT/ND, +BS, no HSM Psych: A&Ox4  Skin: no rashes noted, warm and dry  Lab Results:  Recent Labs    08/17/20 0717 08/18/20 0700  WBC 9.6 8.7  HGB 10.7* 9.6*  HCT 33.3* 29.5*  PLT 389 351   BMET Recent Labs    08/17/20 0717 08/18/20 0700  NA 135 133*  K 3.7 3.2*  CL 103 102  CO2 22 22  GLUCOSE 113* 107*  BUN <5* <5*  CREATININE 0.66 0.52  CALCIUM 8.2* 8.1*   PT/INR No results for input(s): LABPROT, INR in the last 72 hours. CMP     Component Value Date/Time   NA 133 (L) 08/18/2020 0700   K 3.2 (L) 08/18/2020 0700   CL 102 08/18/2020 0700   CO2 22 08/18/2020 0700   GLUCOSE 107 (H) 08/18/2020 0700   BUN <5 (L) 08/18/2020 0700   CREATININE 0.52 08/18/2020 0700   CREATININE 0.69 03/14/2020 1533   CALCIUM 8.1 (L) 08/18/2020 0700   PROT 5.6 (L) 08/18/2020 0700   ALBUMIN 2.9 (L) 08/18/2020  0700   AST 14 (L) 08/18/2020 0700   ALT 10 08/18/2020 0700   ALKPHOS 63 08/18/2020 0700   BILITOT 0.3 08/18/2020 0700   GFRNONAA >60 08/18/2020 0700   Lipase     Component Value Date/Time   LIPASE 21 08/07/2020 0137       Studies/Results: No results found.  Anti-infectives: Anti-infectives (From admission, onward)   Start     Dose/Rate Route Frequency Ordered Stop   08/17/20 1400  metroNIDAZOLE (FLAGYL) IVPB 500 mg        500 mg 100 mL/hr over 60 Minutes Intravenous Every 8 hours 08/17/20 1344     08/16/20 2200  metroNIDAZOLE (FLAGYL) tablet 500 mg  Status:  Discontinued        500 mg Oral Every 8 hours 08/16/20 1742 08/17/20 1344   08/16/20 1800  cefTRIAXone (ROCEPHIN) 2 g in sodium chloride 0.9 % 100 mL IVPB        2 g 200 mL/hr over 30 Minutes Intravenous Every 24 hours 08/16/20 1703     08/16/20 1800  metroNIDAZOLE (FLAGYL) tablet 500 mg  Status:  Discontinued        500 mg Oral Every 8 hours 08/16/20 1703 08/16/20 1742   08/16/20 1430  ceFEPIme (MAXIPIME) 2 g in sodium chloride 0.9 % 100 mL IVPB  Status:  Discontinued       "And" Linked Group Details   2 g 200 mL/hr over 30 Minutes Intravenous  Once 08/16/20 1418 08/16/20 1705   08/16/20 1430  metroNIDAZOLE (FLAGYL) IVPB 500 mg       "And" Linked Group Details   500 mg 100 mL/hr over 60 Minutes Intravenous  Once 08/16/20 1418 08/16/20 1709       Assessment/Plan Trigeminal neuralgia Migraines Hypothyroidism GERD Osteopenia Chronic constipation  Sigmoid diverticulitis with intramural abscess - failed outpatient management  - CT 1/28 showed sigmoid diverticulitis with 3.1 by 1.2 by 1.9 cm intramural abscess, no free air  ID - rocephin/flagyl 1/29>> FEN - IVF, FLD, Boost VTE - SCDs, lovenox Foley - none Follow up - TBD  Plan: WBC has normalized and pain improved. Ok for full liquids. Add probiotic. Continue IV antibiotics.     LOS: 2 days    Franne Forts, [facility] 08/18/2020,  8:24 AM Please see Amion for pager number during day hours 7:00am-4:30pm

## 2020-08-19 DIAGNOSIS — K5792 Diverticulitis of intestine, part unspecified, without perforation or abscess without bleeding: Secondary | ICD-10-CM | POA: Diagnosis not present

## 2020-08-19 LAB — COMPREHENSIVE METABOLIC PANEL
ALT: 13 U/L (ref 0–44)
AST: 15 U/L (ref 15–41)
Albumin: 3.4 g/dL — ABNORMAL LOW (ref 3.5–5.0)
Alkaline Phosphatase: 73 U/L (ref 38–126)
Anion gap: 12 (ref 5–15)
BUN: <5 mg/dL — ABNORMAL LOW (ref 8–23)
CO2: 24 mmol/L (ref 22–32)
Calcium: 8.6 mg/dL — ABNORMAL LOW (ref 8.9–10.3)
Chloride: 103 mmol/L (ref 98–111)
Creatinine, Ser: 0.61 mg/dL (ref 0.44–1.00)
GFR, Estimated: >60 mL/min (ref >60)
Glucose, Bld: 98 mg/dL (ref 70–99)
Potassium: 3.1 mmol/L — ABNORMAL LOW (ref 3.5–5.1)
Sodium: 139 mmol/L (ref 135–145)
Total Bilirubin: 0.2 mg/dL — ABNORMAL LOW (ref 0.3–1.2)
Total Protein: 6.6 g/dL (ref 6.5–8.1)

## 2020-08-19 LAB — CBC
HCT: 35.8 % — ABNORMAL LOW (ref 36.0–46.0)
Hemoglobin: 11.6 g/dL — ABNORMAL LOW (ref 12.0–15.0)
MCH: 31.1 pg (ref 26.0–34.0)
MCHC: 32.4 g/dL (ref 30.0–36.0)
MCV: 96 fL (ref 80.0–100.0)
Platelets: 422 10*3/uL — ABNORMAL HIGH (ref 150–400)
RBC: 3.73 MIL/uL — ABNORMAL LOW (ref 3.87–5.11)
RDW: 12.4 % (ref 11.5–15.5)
WBC: 8.3 10*3/uL (ref 4.0–10.5)
nRBC: 0 % (ref 0.0–0.2)

## 2020-08-19 MED ORDER — POTASSIUM CHLORIDE CRYS ER 20 MEQ PO TBCR
40.0000 meq | EXTENDED_RELEASE_TABLET | Freq: Two times a day (BID) | ORAL | Status: DC
  Administered 2020-08-19 – 2020-08-20 (×3): 40 meq via ORAL
  Filled 2020-08-19 (×3): qty 2

## 2020-08-19 MED ORDER — KETOROLAC TROMETHAMINE 30 MG/ML IJ SOLN
15.0000 mg | Freq: Three times a day (TID) | INTRAMUSCULAR | Status: DC | PRN
  Administered 2020-08-19: 15 mg via INTRAVENOUS
  Filled 2020-08-19: qty 1

## 2020-08-19 MED ORDER — CALCIUM CARBONATE ANTACID 500 MG PO CHEW
1.0000 | CHEWABLE_TABLET | Freq: Two times a day (BID) | ORAL | Status: DC
  Administered 2020-08-19 – 2020-08-20 (×3): 200 mg via ORAL
  Filled 2020-08-19 (×3): qty 1

## 2020-08-19 MED ORDER — ESTRADIOL 0.1 MG/GM VA CREA
1.0000 | TOPICAL_CREAM | Freq: Once | VAGINAL | Status: AC
  Administered 2020-08-19: 1 via VAGINAL
  Filled 2020-08-19: qty 42.5

## 2020-08-19 NOTE — Progress Notes (Signed)
PROGRESS NOTE   Alexa Rios  RAQ:[id number] DOB: 1945/08/28 DOA: 08/16/2020 PCP: Wynn Banker, MD  Brief Narrative:  75 year old white female Known history of dysphagia followed by [place] [facility]-+ duodenal diverticulum tolerated PPI Prior trigeminal neuralgia hypothyroidism migraines Prior diverticulitis with mesenteric panniculitis followed at [facility] previously  Seen in the GI office 08/15/2019 3-week history of progressive lower abdominal pain cramping started on Cipro Flagyl and Bentyl CT was performed showing acute diverticulitis with possible sigmoid colon intramural abscess White count was 12.12 Started on IV antibiotics General surgery consulted   Assessment & Plan:   Active Problems:   Diverticulitis   1. Intramural colonic abscess secondary to failed outpatient management diverticulosis Cipro a. Not surgical candidate currently b. Continue ceftriaxone Flagyl, fluids saline lock 2/1 c. Graduated to soft diet by general surgery d. Expect can discharge in the next several days may need imaging prior to discharge 2. Trigeminal neuralgia a. Currently not on any specific benzodiazepine-seems controlled 3. Migraine a. Monitor continue Ubrogepant b. Considering Reglan addition to help with migraine symptoms c. Revisit in a.m. 4. Hypothyroid a. Continue Synthroid 25 mcg daily b. Outpatient TSH in 3 to 4 weeks 5. Osteopenia a. Continue calcium 1200 daily  DVT prophylaxis: Lovenox Code Status: Presumed full Family Communication: None present at the bedside Disposition:  Status is: Inpatient not ready for discharge-we will need several days and graduation of diet prior to decision making  Remains inpatient appropriate because:Unsafe d/c plan and IV treatments appropriate due to intensity of illness or inability to take PO   Dispo: The patient is from: Home              Anticipated d/c is to: Home              Anticipated d/c date is: 3 days               Patient currently is not medically stable to d/c.   Difficult to place patient No    Consultants:   General surgery  Procedures: None  Antimicrobials: Ceftriaxone Flagyl   Subjective:  Couple of episodes of diarrhea ambulatory no further headaches no chest pain no fever Abdominal pain is 2/10    Objective: Vitals:   08/18/20 0551 08/18/20 1241 08/18/20 2047 08/19/20 0407  BP: 115/75 105/67 106/69 117/79  Pulse: 81 71 70 80  Resp: 20 16 14 18   Temp: 98.9 F (37.2 C) 98.2 F (36.8 C) 98 F (36.7 C) (!) 97.4 F (36.3 C)  TempSrc: Oral Oral Oral Oral  SpO2: 98% 99% 96% 98%  Weight:      Height:        Intake/Output Summary (Last 24 hours) at 08/19/2020 1303 Last data filed at 08/19/2020 1030 Gross per 24 hour  Intake 1264.28 ml  Output --  Net 1264.28 ml   Filed Weights   08/17/20 1306  Weight: 62.4 kg    Examination: EOMI NCAT no focal deficit ambulatory S1-S2 no murmur rub or gallop Abdomen soft nontender no rebound no guarding No lower extremity edema Neurologically intact no focal deficit moving all 4 limbs   Data Reviewed: I have personally reviewed following labs and imaging studies  BUNs/creatinine 5/0.6  potassium 3.1 White count down from 12.2-9.6--> 8.7-->8.3 Hemoglobin 10.1-->10.7--> 9.6-->11.6  COVID-19 Labs  No results for input(s): DDIMER, FERRITIN, LDH, CRP in the last 72 hours.  Lab Results  Component Value Date   SARSCOV2NAA NEGATIVE 08/16/2020     Radiology Studies: No results found.  Scheduled Meds: . enoxaparin (LOVENOX) injection  40 mg Subcutaneous Q24H  . feeding supplement  1 Container Oral BID BM  . levothyroxine  25 mcg Oral QAC breakfast  . saccharomyces boulardii  250 mg Oral BID   Continuous Infusions: . sodium chloride 80 mL/hr at 08/18/20 1348  . cefTRIAXone (ROCEPHIN)  IV 2 g (08/18/20 1722)  . metronidazole 500 mg (08/19/20 0631)     LOS: 3 days    Time spent: 15  Rhetta Mura,  MD [facility] To contact the attending provider between 7A-7P or the covering provider during after hours 7P-7A, please log into the web site [email or web address] and access using universal [place] password for that web site. If you do not have the password, please call the hospital operator.  08/19/2020, 1:03 PM

## 2020-08-19 NOTE — Progress Notes (Signed)
[facility] Progress Note     Subjective: CC-  Feeling much better today. Migraine and n/v have resolved. States that her abdominal pain continues to improve. She is tolerating full liquids without increased pain. She does still have some discomfort during bowel movements. Loose BM x3 today. WBC 8.4, afebrile  Objective: Vital signs in last 24 hours: Temp:  [97.4 F (36.3 C)-98.2 F (36.8 C)] 97.4 F (36.3 C) (02/01 0407) Pulse Rate:  [70-80] 80 (02/01 0407) Resp:  [14-18] 18 (02/01 0407) BP: (105-117)/(67-79) 117/79 (02/01 0407) SpO2:  [96 %-99 %] 98 % (02/01 0407) Last BM Date: 08/18/20  Intake/Output from previous day: 01/31 0701 - 02/01 0700 In: 1264.3 [P.O.:954; IV Piggyback:310.3] Out: -  Intake/Output this shift: No intake/output data recorded.  PE: Gen:  Alert, NAD, pleasant Pulm:  rate and effort normal Abd: Soft, ND, minimal subjective TTP LLQ, +BS, no HSM Psych: A&Ox4  Skin: no rashes noted, warm and dry   Lab Results:  Recent Labs    08/18/20 0700 08/19/20 0608  WBC 8.7 8.3  HGB 9.6* 11.6*  HCT 29.5* 35.8*  PLT 351 422*   BMET Recent Labs    08/18/20 0700 08/19/20 0608  NA 133* 139  K 3.2* 3.1*  CL 102 103  CO2 22 24  GLUCOSE 107* 98  BUN <5* <5*  CREATININE 0.52 0.61  CALCIUM 8.1* 8.6*   PT/INR No results for input(s): LABPROT, INR in the last 72 hours. CMP     Component Value Date/Time   NA 139 08/19/2020 0608   K 3.1 (L) 08/19/2020 0608   CL 103 08/19/2020 0608   CO2 24 08/19/2020 0608   GLUCOSE 98 08/19/2020 0608   BUN <5 (L) 08/19/2020 0608   CREATININE 0.61 08/19/2020 0608   CREATININE 0.69 03/14/2020 1533   CALCIUM 8.6 (L) 08/19/2020 0608   PROT 6.6 08/19/2020 0608   ALBUMIN 3.4 (L) 08/19/2020 0608   AST 15 08/19/2020 0608   ALT 13 08/19/2020 0608   ALKPHOS 73 08/19/2020 0608   BILITOT 0.2 (L) 08/19/2020 0608   GFRNONAA >60 08/19/2020 0608   Lipase     Component Value Date/Time   LIPASE 21 08/07/2020  0137       Studies/Results: No results found.  Anti-infectives: Anti-infectives (From admission, onward)   Start     Dose/Rate Route Frequency Ordered Stop   08/17/20 1400  metroNIDAZOLE (FLAGYL) IVPB 500 mg        500 mg 100 mL/hr over 60 Minutes Intravenous Every 8 hours 08/17/20 1344     08/16/20 2200  metroNIDAZOLE (FLAGYL) tablet 500 mg  Status:  Discontinued        500 mg Oral Every 8 hours 08/16/20 1742 08/17/20 1344   08/16/20 1800  cefTRIAXone (ROCEPHIN) 2 g in sodium chloride 0.9 % 100 mL IVPB        2 g 200 mL/hr over 30 Minutes Intravenous Every 24 hours 08/16/20 1703     08/16/20 1800  metroNIDAZOLE (FLAGYL) tablet 500 mg  Status:  Discontinued        500 mg Oral Every 8 hours 08/16/20 1703 08/16/20 1742   08/16/20 1430  ceFEPIme (MAXIPIME) 2 g in sodium chloride 0.9 % 100 mL IVPB  Status:  Discontinued       "And" Linked Group Details   2 g 200 mL/hr over 30 Minutes Intravenous  Once 08/16/20 1418 08/16/20 1705   08/16/20 1430  metroNIDAZOLE (FLAGYL) IVPB 500 mg       "  And" Linked Group Details   500 mg 100 mL/hr over 60 Minutes Intravenous  Once 08/16/20 1418 08/16/20 1709       Assessment/Plan Trigeminal neuralgia Migraines Hypothyroidism GERD Osteopenia Chronic constipation  Sigmoid diverticulitis with intramural abscess - failed outpatient management  - CT 1/28 showed sigmoid diverticulitis with 3.1 by 1.2 by 1.9 cm intramural abscess, no free air  ID - rocephin/flagyl 1/29>> FEN - IVF, soft diet, Boost VTE - SCDs, lovenox Foley - none Follow up - Dr. Leone Payor  Plan: WBC normalized and pain continues to improves. Ok for soft diet. Continue IV antibiotics. if she continues to have some pain may consider repeat CT scan prior to discharge.   LOS: 3 days    Franne Forts, [facility] 08/19/2020, 8:44 AM Please see Amion for pager number during day hours 7:00am-4:30pm

## 2020-08-19 NOTE — Care Management Important Message (Signed)
Important Message  Patient Details IM Letter given to the Patient. Name: Alexa Rios MRN: [id number] Date of Birth: 1946-02-13   Medicare Important Message Given:  Yes     Caren Macadam 08/19/2020, 9:57 AM

## 2020-08-20 ENCOUNTER — Inpatient Hospital Stay (HOSPITAL_COMMUNITY): Payer: Medicare PPO

## 2020-08-20 DIAGNOSIS — K5792 Diverticulitis of intestine, part unspecified, without perforation or abscess without bleeding: Secondary | ICD-10-CM | POA: Diagnosis not present

## 2020-08-20 LAB — COMPREHENSIVE METABOLIC PANEL
ALT: 13 U/L (ref 0–44)
AST: 16 U/L (ref 15–41)
Albumin: 3.2 g/dL — ABNORMAL LOW (ref 3.5–5.0)
Alkaline Phosphatase: 65 U/L (ref 38–126)
Anion gap: 10 (ref 5–15)
BUN: <5 mg/dL — ABNORMAL LOW (ref 8–23)
CO2: 25 mmol/L (ref 22–32)
Calcium: 8.8 mg/dL — ABNORMAL LOW (ref 8.9–10.3)
Chloride: 104 mmol/L (ref 98–111)
Creatinine, Ser: 0.64 mg/dL (ref 0.44–1.00)
GFR, Estimated: >60 mL/min (ref >60)
Glucose, Bld: 133 mg/dL — ABNORMAL HIGH (ref 70–99)
Potassium: 3.9 mmol/L (ref 3.5–5.1)
Sodium: 139 mmol/L (ref 135–145)
Total Bilirubin: 0.1 mg/dL — ABNORMAL LOW (ref 0.3–1.2)
Total Protein: 6.2 g/dL — ABNORMAL LOW (ref 6.5–8.1)

## 2020-08-20 LAB — CBC
HCT: 31.7 % — ABNORMAL LOW (ref 36.0–46.0)
Hemoglobin: 10.3 g/dL — ABNORMAL LOW (ref 12.0–15.0)
MCH: 30.9 pg (ref 26.0–34.0)
MCHC: 32.5 g/dL (ref 30.0–36.0)
MCV: 95.2 fL (ref 80.0–100.0)
Platelets: 403 10*3/uL — ABNORMAL HIGH (ref 150–400)
RBC: 3.33 MIL/uL — ABNORMAL LOW (ref 3.87–5.11)
RDW: 12.6 % (ref 11.5–15.5)
WBC: 4.1 10*3/uL (ref 4.0–10.5)
nRBC: 0 % (ref 0.0–0.2)

## 2020-08-20 MED ORDER — IOHEXOL 9 MG/ML PO SOLN
500.0000 mL | ORAL | Status: AC
  Administered 2020-08-20: 500 mL via ORAL

## 2020-08-20 MED ORDER — LEVOTHYROXINE SODIUM 25 MCG PO TABS: 25.0000 ug | ORAL_TABLET | Freq: Every day | ORAL | 1 refills | Status: DC

## 2020-08-20 MED ORDER — AMOXICILLIN-POT CLAVULANATE 875-125 MG PO TABS: 1.0000 | ORAL_TABLET | Freq: Two times a day (BID) | ORAL | 0 refills | Status: DC

## 2020-08-20 MED ORDER — IOHEXOL 9 MG/ML PO SOLN: 500.0000 mL | ORAL | Status: DC

## 2020-08-20 MED ORDER — AMOXICILLIN-POT CLAVULANATE 875-125 MG PO TABS: 1.0000 | ORAL_TABLET | Freq: Two times a day (BID) | ORAL | 0 refills | Status: AC

## 2020-08-20 MED ORDER — LEVOTHYROXINE SODIUM 25 MCG PO TABS: 25.0000 ug | ORAL_TABLET | Freq: Every day | ORAL | 1 refills | Status: AC

## 2020-08-20 MED ORDER — IOHEXOL 9 MG/ML PO SOLN
ORAL | Status: AC
  Administered 2020-08-20: 500 mL
  Filled 2020-08-20: qty 1000

## 2020-08-20 MED ORDER — IOHEXOL 300 MG/ML  SOLN
100.0000 mL | Freq: Once | INTRAMUSCULAR | Status: AC | PRN
  Administered 2020-08-20: 100 mL via INTRAVENOUS

## 2020-08-20 NOTE — Discharge Instructions (Signed)
Low-Fiber Eating Plan Fiber is found in fruits, vegetables, whole grains, and beans. Eating a diet low in fiber helps to reduce how often you have bowel movements and the amount of stool you produce. A low-fiber eating plan may help your digestive system heal if you:  Have certain conditions, such as Crohn's disease, diverticulitis, or irritable bowel syndrome (IBS), and are having a flare-up.  Recently had radiation therapy on your pelvis or bowel.  Recently had intestinal surgery.  Have a new surgical opening in your abdomen (colostomy or ileostomy).  Have an intestine that has narrowed (stricture). Your health care provider will tell you how long to stay on this diet and may recommend that you work with a dietitian. What are tips for following this plan? Reading food labels  Check the nutrition facts label on food products for the amount of dietary fiber.  Choose foods that have less than 2 grams (g) of fiber per serving.   Cooking  Use white flour for baking and cooking.  Cook meat using methods that keep it tender, such as braising or poaching.  Cook eggs until the yolk is completely solid.  Cook with healthy oils, such as olive oil or canola oil. Meal planning  Eat 5-6 small meals throughout the day instead of 3 large meals.  If you are lactose intolerant: ? Choose low-lactose dairy foods. ? Do not eat dairy foods if told by your health care provider or dietitian.  Limit fats and oils to less than 8 teaspoons (39 mL) a day.  Eat small portions of desserts.  Limit acidic, spicy, or fried foods to reduce gas, bloating, and discomfort. General information  Follow instructions from your health care provider or dietitian about how much fiber you should have each day.  Most people on a low-fiber eating plan should eat less than 10 g of fiber a day. Your daily fiber goal is _________________ g.  Take vitamin and mineral supplements as told by your health care provider or  dietitian. Chewable or liquid forms are best when on this eating plan. A gummy vitamin is not recommended. What foods should I eat? Fruits Soft-cooked or canned fruits without skin and seeds. Ripe banana. Applesauce. Fruit juice without pulp. Vegetables Well-cooked or canned vegetables without skin, seeds, or stems. Cooked potatoes without skins. Vegetable juice. Grains All bread and crackers made with white flour. Waffles, pancakes, and French toast. Bagels. Pretzels. Melba toast, zwieback, and matzoh. Cooked and dried cereals that do not have whole grains, added fiber, seeds, or dried fruit. [place]. Hot and cold cereals made with refined corn, rice, or oats. Plain pasta and noodles. White rice. Meats and other proteins Ground meat. Tender cuts of meat or poultry. Eggs. Fish, seafood, and shellfish. Smooth nut butters. Tofu. Dairy All milk products and drinks. Lactose-free milk, including rice, soy, and almond milk. Yogurt without fruit, nuts, chocolate, or granola mixed in. Sour cream. Cottage cheese. Cheese. Fats and oils Olive oil, canola oil, sunflower oil, flaxseed oil, avocado oil, and grapeseed oil. Mayonnaise. Cream cheese. Margarine. Butter. Beverages Decaf coffee. Fruit and vegetable juices. Smoothies (in small amounts, with no pulp or skins, and with fruits from the recommended list). Sports drinks. Herbal tea. Water. Sweets and desserts Plain cakes. Cookies. Cream pies and pies made with recommended fruits. Pudding. Custard. Fruit gelatin. Sherbet. Ice pops. Ice cream without nuts. Hard candy. Honey. Jelly. Molasses. Syrups. Chocolate. Marshmallows. Gumdrops. Seasonings and condiments Ketchup. Mild mustard. Mild salad dressings. Plain gravies. Vinegar. Spices in   moderation. Salt. Sugar. Other foods Bouillon. Broth. Cream and strained soups made from recommended foods. Casseroles made with recommended foods. The items listed above may not be a complete list of foods and beverages  you can eat. Contact a dietitian for more information. What foods should I avoid? Fruits Raw or dried fruit. Berries. Fruit juice with pulp. Prune juice. Vegetables Potato skins. Raw or undercooked vegetables. All beans and bean sprouts. Cooked greens. Corn. Peas. Cabbage. Beets. Broccoli. Brussels sprouts. Cauliflower. Mushrooms. Onions. Peppers. Parsnips. Okra. Sauerkraut. Grains Whole-wheat, whole-grain, or multigrain breads, cereals, or crackers. Rye bread. Cereals with nuts, raisins, or coconut. Bran. Granola. High-fiber cereals. Cornmeal or corn bread. Whole-grain pasta. Wild or brown rice. Quinoa. Popcorn. Buckwheat. Wheat germ. Meats and other proteins Tough, fibrous meats with gristle. Fatty meat. Poultry with skin. Fried meat, Environmental education officer, or fish. Precooked or cured meat, such as sausages or meat loaves. Alexa Rios. Hot dogs. Nuts and chunky nut butter. Dried peas, beans, and lentils. Hummus. Dairy Yogurt with fruit, nuts, chocolate, or granola mixed in. Full-fat dairy such as whole milk, ice cream, or sour cream. Beverages Caffeinated coffee and teas. Fats and oils Avocado. Coconut. Butter. Sweets and desserts Desserts, cookies, or candies that contain nuts or coconut. Dried fruit. Jams and preserves with seeds. Marmalade. Any dessert made with fruits or grains that are not recommended. Seasonings and condiments Relish. Horseradish. Rosita Fire. Olives. Other foods Corn tortilla chips. Soups made with vegetables or grains that are not recommended. The items listed above may not be a complete list of foods and beverages you should avoid. Contact a dietitian for more information. Summary  Most people on a low-fiber eating plan should eat less than 10 grams of fiber a day. Follow recommendations from your health care provider or dietitian about how much fiber you should have each day.  Always check nutrition facts labels to see the dietary fiber amount in packaged foods. A low-fiber food will  have less than 2 grams of fiber per serving.  Try to avoid whole grains, raw fruits and vegetables, dried fruit, tough cuts of meat, nuts, and seeds.  Take a vitamin and mineral supplement as told by your health care provider or dietitian. This information is not intended to replace advice given to you by your health care provider. Make sure you discuss any questions you have with your health care provider. Document Revised: 11/08/2019 Document Reviewed: 11/08/2019 Elsevier Patient Education  2021 [employer]    Diverticulitis  Diverticulitis is when small pouches in your colon (large intestine) get infected or swollen. This causes pain in the belly (abdomen) and watery poop (diarrhea). These pouches are called diverticula. The pouches form in people who have a condition called diverticulosis. What are the causes? This condition may be caused by poop (stool) that gets trapped in the pouches in your colon. The poop lets germs (bacteria) grow in the pouches. This causes the infection. What increases the risk? You are more likely to get this condition if you have small pouches in your colon. The risk is higher if:  You are overweight or very overweight (obese).  You do not exercise enough.  You drink alcohol.  You smoke or use products with tobacco in them.  You eat a diet that has a lot of red meat such as beef, pork, or lamb.  You eat a diet that does not have enough fiber in it.  You are older than 74 years of age. What are the signs or symptoms?  Pain  in the belly. Pain is often on the left side, but it may be in other areas.  Fever and feeling cold.  Feeling like you may vomit.  Vomiting.  Having cramps.  Feeling full.  Changes to how often you poop.  Blood in your poop. How is this treated? Most cases are treated at home by:  Taking over-the-counter pain medicines.  Following a clear liquid diet.  Taking antibiotic medicines.  Resting. Very bad cases  may need to be treated at a hospital. This may include:  Not eating or drinking.  Taking prescription pain medicine.  Getting antibiotic medicines through an IV tube.  Getting fluid and food through an IV tube.  Having surgery. When you are feeling better, your doctor may tell you to have a test to check your colon (colonoscopy). Follow these instructions at home: Medicines  Take over-the-counter and prescription medicines only as told by your doctor. These include: ? Antibiotics. ? Pain medicines. ? Fiber pills. ? Probiotics. ? Stool softeners.  If you were prescribed an antibiotic medicine, take it as told by your doctor. Do not stop taking the antibiotic even if you start to feel better.  Ask your doctor if the medicine prescribed to you requires you to avoid driving or using machinery. Eating and drinking  Follow a diet as told by your doctor.  When you feel better, your doctor may tell you to change your diet. You may need to eat a lot of fiber. Fiber makes it easier to poop (have a bowel movement). Foods with fiber include: ? Berries. ? Beans. ? Lentils. ? Green vegetables.  Avoid eating red meat.   General instructions  Do not use any products that contain nicotine or tobacco, such as cigarettes, e-cigarettes, and chewing tobacco. If you need help quitting, ask your doctor.  Exercise 3 or more times a week. Try to get 30 minutes each time. Exercise enough to sweat and make your heart beat faster.  Keep all follow-up visits as told by your doctor. This is important. Contact a doctor if:  Your pain does not get better.  You are not pooping like normal. Get help right away if:  Your pain gets worse.  Your symptoms do not get better.  Your symptoms get worse very fast.  You have a fever.  You vomit more than one time.  You have poop that is: ? Bloody. ? Black. ? Tarry. Summary  This condition happens when small pouches in your colon get infected or  swollen.  Take medicines only as told by your doctor.  Follow a diet as told by your doctor.  Keep all follow-up visits as told by your doctor. This is important. This information is not intended to replace advice given to you by your health care provider. Make sure you discuss any questions you have with your health care provider. Document Revised: 04/16/2019 Document Reviewed: 04/16/2019 Elsevier Patient Education  2021 [employer].

## 2020-08-20 NOTE — Progress Notes (Signed)
[facility] Progress Note     Subjective: CC-  Already up ambulating in the halls this morning. Overall improved but does continue to have some lower abdominal discomfort with bowel movements, and mild nausea at times. No emesis. Tolerating diet. She has had a couple loose stools this AM. WBC 4.1, afebrile.  Objective: Vital signs in last 24 hours: Temp:  [97.4 F (36.3 C)-98 F (36.7 C)] 97.7 F (36.5 C) (02/02 0543) Pulse Rate:  [69-87] 87 (02/02 0543) Resp:  [14-16] 14 (02/02 0543) BP: (119-130)/(74-93) 120/77 (02/02 0543) SpO2:  [96 %-100 %] 96 % (02/02 0543) Last BM Date: 08/19/20  Intake/Output from previous day: 02/01 0701 - 02/02 0700 In: 720 [P.O.:720] Out: 1 [Urine:1] Intake/Output this shift: No intake/output data recorded.  PE: Gen: Alert, NAD, pleasant Pulm: rate and effort normal Abd: Soft, ND, minimal subjective TTP LLQ, +BS, no HSM Psych: A&Ox4  Skin: no rashes noted, warm and dry   Lab Results:  Recent Labs    08/19/20 0608 08/20/20 0658  WBC 8.3 4.1  HGB 11.6* 10.3*  HCT 35.8* 31.7*  PLT 422* 403*   BMET Recent Labs    08/19/20 0608 08/20/20 0658  NA 139 139  K 3.1* 3.9  CL 103 104  CO2 24 25  GLUCOSE 98 133*  BUN <5* <5*  CREATININE 0.61 0.64  CALCIUM 8.6* 8.8*   PT/INR No results for input(s): LABPROT, INR in the last 72 hours. CMP     Component Value Date/Time   NA 139 08/20/2020 0658   K 3.9 08/20/2020 0658   CL 104 08/20/2020 0658   CO2 25 08/20/2020 0658   GLUCOSE 133 (H) 08/20/2020 0658   BUN <5 (L) 08/20/2020 0658   CREATININE 0.64 08/20/2020 0658   CREATININE 0.69 03/14/2020 1533   CALCIUM 8.8 (L) 08/20/2020 0658   PROT 6.2 (L) 08/20/2020 0658   ALBUMIN 3.2 (L) 08/20/2020 0658   AST 16 08/20/2020 0658   ALT 13 08/20/2020 0658   ALKPHOS 65 08/20/2020 0658   BILITOT 0.1 (L) 08/20/2020 0658   GFRNONAA >60 08/20/2020 0658   Lipase     Component Value Date/Time   LIPASE 21 08/07/2020 0137        Studies/Results: No results found.  Anti-infectives: Anti-infectives (From admission, onward)   Start     Dose/Rate Route Frequency Ordered Stop   08/17/20 1400  metroNIDAZOLE (FLAGYL) IVPB 500 mg        500 mg 100 mL/hr over 60 Minutes Intravenous Every 8 hours 08/17/20 1344     08/16/20 2200  metroNIDAZOLE (FLAGYL) tablet 500 mg  Status:  Discontinued        500 mg Oral Every 8 hours 08/16/20 1742 08/17/20 1344   08/16/20 1800  cefTRIAXone (ROCEPHIN) 2 g in sodium chloride 0.9 % 100 mL IVPB        2 g 200 mL/hr over 30 Minutes Intravenous Every 24 hours 08/16/20 1703     08/16/20 1800  metroNIDAZOLE (FLAGYL) tablet 500 mg  Status:  Discontinued        500 mg Oral Every 8 hours 08/16/20 1703 08/16/20 1742   08/16/20 1430  ceFEPIme (MAXIPIME) 2 g in sodium chloride 0.9 % 100 mL IVPB  Status:  Discontinued       "And" Linked Group Details   2 g 200 mL/hr over 30 Minutes Intravenous  Once 08/16/20 1418 08/16/20 1705   08/16/20 1430  metroNIDAZOLE (FLAGYL) IVPB 500 mg       "  And" Linked Group Details   500 mg 100 mL/hr over 60 Minutes Intravenous  Once 08/16/20 1418 08/16/20 1709       Assessment/Plan Trigeminal neuralgia Migraines Hypothyroidism GERD Osteopenia Chronic constipation  Sigmoid diverticulitis with intramural abscess -failed outpatient management  - CT 1/28 showed sigmoid diverticulitis with3.1 by 1.2 by 1.9 cmintramural abscess, no free air  ID -rocephin/flagyl 1/29>> FEN - soft diet, Boost VTE -SCDs, lovenox Foley -none Follow up -Dr. Leone Payor  Plan: Overall clinically improving and WBC has normalized, but she does still have some mild abdominal pain and nausea so we will repeat CT scan today. Continue IV antibiotics for now.   LOS: 4 days    Franne Forts, [facility] 08/20/2020, 8:50 AM Please see Amion for pager number during day hours 7:00am-4:30pm

## 2020-08-21 NOTE — Discharge Summary (Signed)
Physician Discharge Summary   Alexa Rios TFT:[id number] DOB: 04/25/1946 DOA: 08/16/2020  PCP: Wynn Banker, MD  Admit date: 08/16/2020 Discharge date: 08/21/2020  Admitted From: home Disposition:  home Discharging physician: Lewie Chamber, MD  Recommendations for Outpatient Follow-up:  1. Follow-up with GI outpatient for colonoscopy in 4 to 6 weeks   Patient discharged to home in Discharge Condition: stable Risk of unplanned readmission score: Unplanned Admission- Pilot do not use: 8.46  CODE STATUS: Full Diet recommendation:   Hospital Course:  Alexa Rios is a 75 year old female who was admitted with left-sided abdominal pain. She has a history of diverticulitis with prior episode years ago. She has never required surgery for this. She underwent outpatient CT abdomen/pelvis which revealed acute diverticulitis with sigmoid abscess. She was admitted for IV antibiotics and further monitoring. She underwent repeat CT abdomen/pelvis on 08/20/2020 which showed resolution of the abscess and good clinical response to IV antibiotics during hospitalization. She was followed by general surgery during hospitalization as well. After CT showing improvement, she was considered stable for discharging home with antibiotics to complete course and following up with GI for colonoscopy after resolution of acute illness. She was discharged home in stable condition.   The patient's chronic medical conditions were treated accordingly per the patient's home medication regimen except as noted.  On day of discharge, patient was felt deemed stable for discharge. Patient/family member advised to call PCP or come back to ER if needed.   Principal Diagnosis: Diverticulitis  Discharge Diagnoses: Active Hospital Problems   Diagnosis Date Noted  . Diverticulitis 08/16/2020    Priority: [facility] Problems  No resolved problems to display.    Discharge Instructions    Increase  activity slowly   Complete by: As directed      Allergies as of 08/20/2020      Reactions   Sulfamethoxazole-trimethoprim    Sumatriptan    palpitations      Medication List    STOP taking these medications   ciprofloxacin 500 MG tablet Commonly known as: CIPRO   dicyclomine 10 MG capsule Commonly known as: BENTYL     TAKE these medications   amoxicillin-clavulanate 875-125 MG tablet Commonly known as: Augmentin Take 1 tablet by mouth every 12 (twelve) hours for 10 days.   BIOTIN 5000 PO Take 1 tablet by mouth in the morning and at bedtime.   CALCIUM PO Take 1,200 mg by mouth daily.   COSAMIN DS PO Take 1 tablet by mouth daily.   Estradiol 1 MG/GM Gel Apply 1mg  twice a week as needed What changed:   how much to take  how to take this  when to take this  additional instructions   EVENING PRIMROSE OIL PO Take 500 mg by mouth daily.   levothyroxine 25 MCG tablet Commonly known as: SYNTHROID Take 1 tablet (25 mcg total) by mouth daily before breakfast.   MULTIVITAMIN ADULT PO Take by mouth.   polyethylene glycol 17 g packet Commonly known as: MIRALAX / GLYCOLAX Take 17 g by mouth 3 (three) times daily.   Potassium 99 MG Tabs Take 1 tablet by mouth daily.   Ubrelvy 50 MG Tabs Generic drug: Ubrogepant Take 50 mg by mouth once as needed for up to 1 dose. What changed:   when to take this  reasons to take this       Follow-up Information    , MD Follow up.   Specialty: Gastroenterology Contact  information: [address] [place] [address] [phone]              Allergies  Allergen Reactions  . Sulfamethoxazole-Trimethoprim   . Sumatriptan     palpitations    Consultations: General surgery  Discharge Exam: BP 129/79 (BP Location: Right Arm)   Pulse 74   Temp 97.6 F (36.4 C) (Oral)   Resp 16   Ht 5' 3.5" (1.613 m)   Wt 62.4 kg   SpO2 100%   BMI 23.99 kg/m  General appearance: alert, cooperative  and no distress Head: Normocephalic, without obvious abnormality, atraumatic Eyes: EOMI Lungs: clear to auscultation bilaterally Heart: regular rate and rhythm and S1, S2 normal Abdomen: normal findings: bowel sounds normal and soft, non-tender Extremities: No edema Skin: mobility and turgor normal Neurologic: Grossly normal  The results of significant diagnostics from this hospitalization (including imaging, microbiology, ancillary and laboratory) are listed below for reference.   Microbiology: Recent Results (from the past 240 hour(s))  SARS CORONAVIRUS 2 (TAT 6-24 HRS) Nasopharyngeal Nasopharyngeal Swab     Status: None   Collection Time: 08/16/20  2:59 PM   Specimen: Nasopharyngeal Swab  Result Value Ref Range Status   SARS Coronavirus 2 NEGATIVE NEGATIVE Final    Comment: (NOTE) SARS-CoV-2 target nucleic acids are NOT DETECTED.  The SARS-CoV-2 RNA is generally detectable in upper and lower respiratory specimens during the acute phase of infection. Negative results do not preclude SARS-CoV-2 infection, do not rule out co-infections with other pathogens, and should not be used as the sole basis for treatment or other patient management decisions. Negative results must be combined with clinical observations, patient history, and epidemiological information. The expected result is Negative.  Fact Sheet for Patients: HairSlick.no  Fact Sheet for Healthcare Providers: quierodirigir.com  This test is not yet approved or cleared by the [place] FDA and  has been authorized for detection and/or diagnosis of SARS-CoV-2 by FDA under an Emergency Use Authorization (EUA). This EUA will remain  in effect (meaning this test can be used) for the duration of the COVID-19 declaration under Se ction 564(b)(1) of the Act, 21 U.S.C. section 360bbb-3(b)(1), unless the authorization is terminated or revoked sooner.  Performed at  [facility], [address] [place], [place] [address]      Labs: BNP (last 3 results) No results for input(s): BNP in the last 8760 hours. Basic Metabolic Panel: Recent Labs  Lab 08/16/20 1459 08/17/20 0717 08/18/20 0700 08/19/20 0608 08/20/20 0658  NA 136 135 133* 139 139  K 3.7 3.7 3.2* 3.1* 3.9  CL 102 103 102 103 104  CO2 23 22 22 24 25   GLUCOSE 102* 113* 107* 98 133*  BUN 9 <5* <5* <5* <5*  CREATININE 0.65 0.66 0.52 0.61 0.64  CALCIUM 8.6* 8.2* 8.1* 8.6* 8.8*   Liver Function Tests: Recent Labs  Lab 08/18/20 0700 08/19/20 0608 08/20/20 0658  AST 14* 15 16  ALT 10 13 13   ALKPHOS 63 73 65  BILITOT 0.3 0.2* 0.1*  PROT 5.6* 6.6 6.2*  ALBUMIN 2.9* 3.4* 3.2*   No results for input(s): LIPASE, AMYLASE in the last 168 hours. No results for input(s): AMMONIA in the last 168 hours. CBC: Recent Labs  Lab 08/16/20 1459 08/17/20 0717 08/18/20 0700 08/19/20 0608 08/20/20 0658  WBC 12.2* 9.6 8.7 8.3 4.1  NEUTROABS 9.9*  --   --   --   --   HGB 11.1* 10.7* 9.6* 11.6* 10.3*  HCT 33.9* 33.3* 29.5* 35.8* 31.7*  MCV 95.2 96.0 95.5 96.0 95.2  PLT 399 389 351 422* 403*   Cardiac Enzymes: No results for input(s): CKTOTAL, CKMB, CKMBINDEX, TROPONINI in the last 168 hours. BNP: Invalid input(s): POCBNP CBG: No results for input(s): GLUCAP in the last 168 hours. D-Dimer No results for input(s): DDIMER in the last 72 hours. Hgb A1c No results for input(s): HGBA1C in the last 72 hours. Lipid Profile No results for input(s): CHOL, HDL, LDLCALC, TRIG, CHOLHDL, LDLDIRECT in the last 72 hours. Thyroid function studies No results for input(s): TSH, T4TOTAL, T3FREE, THYROIDAB in the last 72 hours.  Invalid input(s): FREET3 Anemia work up No results for input(s): VITAMINB12, FOLATE, FERRITIN, TIBC, IRON, RETICCTPCT in the last 72 hours. Urinalysis    Component Value Date/Time   COLORURINE STRAW (A) 08/17/2020 0459   APPEARANCEUR CLEAR 08/17/2020 0459   LABSPEC  1.004 (L) 08/17/2020 0459   PHURINE 6.0 08/17/2020 0459   GLUCOSEU NEGATIVE 08/17/2020 0459   HGBUR NEGATIVE 08/17/2020 0459   BILIRUBINUR NEGATIVE 08/17/2020 0459   KETONESUR NEGATIVE 08/17/2020 0459   PROTEINUR NEGATIVE 08/17/2020 0459   NITRITE NEGATIVE 08/17/2020 0459   LEUKOCYTESUR NEGATIVE 08/17/2020 0459   Sepsis Labs Invalid input(s): PROCALCITONIN,  WBC,  LACTICIDVEN Microbiology Recent Results (from the past 240 hour(s))  SARS CORONAVIRUS 2 (TAT 6-24 HRS) Nasopharyngeal Nasopharyngeal Swab     Status: None   Collection Time: 08/16/20  2:59 PM   Specimen: Nasopharyngeal Swab  Result Value Ref Range Status   SARS Coronavirus 2 NEGATIVE NEGATIVE Final    Comment: (NOTE) SARS-CoV-2 target nucleic acids are NOT DETECTED.  The SARS-CoV-2 RNA is generally detectable in upper and lower respiratory specimens during the acute phase of infection. Negative results do not preclude SARS-CoV-2 infection, do not rule out co-infections with other pathogens, and should not be used as the sole basis for treatment or other patient management decisions. Negative results must be combined with clinical observations, patient history, and epidemiological information. The expected result is Negative.  Fact Sheet for Patients: HairSlick.nohttps://www.fda.gov/media/138098/download  Fact Sheet for Healthcare Providers: quierodirigir.comhttps://www.fda.gov/media/138095/download  This test is not yet approved or cleared by the [place] FDA and  has been authorized for detection and/or diagnosis of SARS-CoV-2 by FDA under an Emergency Use Authorization (EUA). This EUA will remain  in effect (meaning this test can be used) for the duration of the COVID-19 declaration under Se ction 564(b)(1) of the Act, 21 U.S.C. section 360bbb-3(b)(1), unless the authorization is terminated or revoked sooner.  Performed at [facility], [address] [place], [place] [id number]     Procedures/Studies: CT ABDOMEN PELVIS W  CONTRAST  Result Date: 08/20/2020 CLINICAL DATA:  Inpatient. Follow-up complicated sigmoid diverticulitis with intramural abscess. Mid abdominal pain. EXAM: CT ABDOMEN AND PELVIS WITH CONTRAST TECHNIQUE: Multidetector CT imaging of the abdomen and pelvis was performed using the standard protocol following bolus administration of intravenous contrast. CONTRAST:  100mL OMNIPAQUE IOHEXOL 300 MG/ML  SOLN COMPARISON:  08/15/2020 CT abdomen/pelvis. FINDINGS: Lower chest: No significant pulmonary nodules or acute consolidative airspace disease. Hepatobiliary: Normal liver size. Scattered subcentimeter hypodense liver lesions are too small to characterize and are unchanged, requiring no follow-up unless the patient has risk factors for liver malignancy. No additional liver lesions. Cholecystectomy. Bile ducts are stable and upper normal for the post cholecystectomy state with CBD diameter 10 mm. No radiopaque choledocholithiasis. Stable small periampullary duodenal diverticulum. Pancreas: Normal, with no mass or duct dilation. Spleen: Normal size.  No mass. Adrenals/Urinary Tract: Normal adrenals. Moderate renal cortical scarring in the posterior upper right kidney, unchanged. No overt hydronephrosis. Scattered subcentimeter hypodense renal cortical lesions in both kidneys are too small to characterize and are unchanged, requiring no follow-up. Normal bladder. Stomach/Bowel: Normal non-distended stomach. Normal caliber small bowel with no small bowel wall thickening. Normal appendix. Moderate sigmoid diverticulosis. Persistent segmental wall thickening throughout the distal sigmoid colon with prominent patchy pericolonic fat stranding and ill-defined fluid, similar. Previously described small intramural abscess in the distal sigmoid colon has resolved. No measurable pericolonic fluid collections or free air. Vascular/Lymphatic: Atherosclerotic nonaneurysmal abdominal aorta. Patent portal, splenic, hepatic and renal veins.  No pathologically enlarged lymph nodes in the abdomen or pelvis. Reproductive: Status post hysterectomy, with no abnormal findings at the vaginal cuff. No adnexal mass. Other: No pneumoperitoneum, ascites or focal fluid collection. Musculoskeletal: No aggressive appearing focal osseous lesions. Stable asymmetric trabecular and cortical thickening throughout the left iliac bone compatible with Paget's disease. Moderate lower lumbar spondylosis. IMPRESSION: 1. Previously described small intramural abscess in the distal sigmoid colon has resolved. No new abscess. No free air. Otherwise persistent prominent inflammatory changes of sigmoid diverticulitis. 2. Aortic Atherosclerosis (ICD10-I70.0). Electronically Signed   By: Delbert Phenix M.D.   On: 08/20/2020 13:52   CT Abdomen Pelvis W Contrast  Result Date: 08/15/2020 CLINICAL DATA:  Progressive lower abdominal pain with cramping and constipation along with intermittent fevers. EXAM: CT ABDOMEN AND PELVIS WITH CONTRAST TECHNIQUE: Multidetector CT imaging of the abdomen and pelvis was performed using the standard protocol following bolus administration of intravenous contrast. CONTRAST:  OMNIPAQUE IOHEXOL 300 MG/ML  SOLN COMPARISON:  None. FINDINGS: Lower chest: Unremarkable Hepatobiliary: 0.7 by 0.6 cm hypodense lesion in the dome of the right hepatic lobe on image 9 of series 2 is technically nonspecific although statistically likely to be benign. Similar 0.8 by 0.5 cm lesion in the lateral segment left hepatic lobe on image 14 of series 2. Cholecystectomy noted. Common bile duct measures up to 0.9 cm, mildly prominent but probably a physiologic response to prior cholecystectomy. Pancreas: Unremarkable Spleen: Unremarkable Adrenals/Urinary Tract: The adrenal glands appear normal. Prominent scarring in the right kidney upper pole posteriorly with adjacent calyceal diverticulum. Small hypodense lesions in the right kidney lower pole are technically too small to  characterize although statistically likely to be cysts. Urinary bladder unremarkable. 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 The orally administered contrast makes its way through to the ascending colon. There is a notably inflamed segment of distal sigmoid colon measuring at least 10 cm in length, with considerable wall thickening and diverticulosis. 3.1 by 1.2 by 1.9 cm fluid density along the wall of the distal sigmoid colon could represent an intramural or paracolic abscess. No overtly free intraperitoneal gas is identified. The there is surrounding edema in the mesentery. Vascular/Lymphatic: Mild aortoiliac atherosclerotic vascular calcification. No pathologic adenopathy identified. Reproductive: Uterus absent.  Adnexa unremarkable. Other: No supplemental non-categorized findings. 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 Mild dextroconvex lower lumbar scoliosis. Grade 1 degenerative anterolisthesis at L4-5. Lower lumbar spondylosis and degenerative disc disease resulting in mild left foraminal impingement at L4-5 and potentially at L5-S1. IMPRESSION: 1. Prominently inflamed segment of distal sigmoid colon measuring at least 10 cm in length, with considerable wall thickening and diverticulosis. There is a 3.1 by 1.2 by 1.9 cm fluid density along the wall of the distal sigmoid colon could represent an intramural or  paracolic abscess. No overtly free intraperitoneal gas is identified. 2. Prominent scarring in the right kidney upper pole posteriorly with adjacent calyceal diverticulum. 3. 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 4. Lower lumbar spondylosis and degenerative disc disease resulting in mild left foraminal impingement at L4-5 and potentially at L5-S1. 5. Small hypodense lesions in the liver and right kidney lower pole are technically too small to characterize although  statistically likely to be benign. 6. 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 7. Aortic atherosclerosis. Aortic Atherosclerosis (ICD10-I70.0). Electronically Signed   By: Gaylyn Rong M.D.   On: 08/15/2020 19:53     Time coordinating discharge: Over 30 minutes    Lewie Chamber, MD  [facility] 08/21/2020, 5:31 PM

## 2020-08-25 ENCOUNTER — Telehealth: Payer: Self-pay | Admitting: Internal Medicine

## 2020-08-25 ENCOUNTER — Other Ambulatory Visit: Payer: Self-pay

## 2020-08-25 MED ORDER — METRONIDAZOLE 500 MG PO TABS: 500.0000 mg | ORAL_TABLET | Freq: Two times a day (BID) | ORAL | 0 refills | Status: DC

## 2020-08-25 MED ORDER — CIPROFLOXACIN HCL 500 MG PO TABS: 500.0000 mg | ORAL_TABLET | Freq: Two times a day (BID) | ORAL | 0 refills | Status: DC

## 2020-08-25 NOTE — Telephone Encounter (Signed)
Pt is requesting a call back from a nurse to discuss a letter she received indicating that she will not be covered for hospital services, pt states she was seen last week at the hospital per dr Marvell Fuller orders.

## 2020-08-26 NOTE — Telephone Encounter (Signed)
Patient advised that the billing is coming from the hospital and not Dr. Marvell Fuller office.  The hospital has not submitted any billing at this point according to the patient.  She is advised that she should disregard the letter until she receives bills from the hospital

## 2020-08-27 ENCOUNTER — Other Ambulatory Visit: Payer: Self-pay | Admitting: Internal Medicine

## 2020-08-27 MED ORDER — ONDANSETRON HCL 4 MG PO TABS: 4.0000 mg | ORAL_TABLET | Freq: Three times a day (TID) | ORAL | 1 refills | Status: DC | PRN

## 2020-08-28 ENCOUNTER — Ambulatory Visit: Payer: Medicare PPO | Admitting: Physician Assistant

## 2020-09-01 ENCOUNTER — Encounter: Payer: Self-pay | Admitting: Internal Medicine

## 2020-09-01 ENCOUNTER — Other Ambulatory Visit (INDEPENDENT_AMBULATORY_CARE_PROVIDER_SITE_OTHER): Payer: Medicare PPO

## 2020-09-01 ENCOUNTER — Ambulatory Visit: Payer: Medicare PPO | Admitting: Internal Medicine

## 2020-09-01 VITALS — BP 110/62 | HR 92 | Ht 63.5 in | Wt 136.2 lb

## 2020-09-01 DIAGNOSIS — D649 Anemia, unspecified: Secondary | ICD-10-CM

## 2020-09-01 DIAGNOSIS — K5792 Diverticulitis of intestine, part unspecified, without perforation or abscess without bleeding: Secondary | ICD-10-CM

## 2020-09-01 DIAGNOSIS — R935 Abnormal findings on diagnostic imaging of other abdominal regions, including retroperitoneum: Secondary | ICD-10-CM | POA: Insufficient documentation

## 2020-09-01 LAB — CBC WITH DIFFERENTIAL/PLATELET
Basophils Absolute: 0.1 10*3/uL (ref 0.0–0.1)
Basophils Relative: 0.6 % (ref 0.0–3.0)
Eosinophils Absolute: 0.1 10*3/uL (ref 0.0–0.7)
Eosinophils Relative: 1 % (ref 0.0–5.0)
HCT: 35.5 % — ABNORMAL LOW (ref 36.0–46.0)
Hemoglobin: 12 g/dL (ref 12.0–15.0)
Lymphocytes Relative: 23.4 % (ref 12.0–46.0)
Lymphs Abs: 2.1 10*3/uL (ref 0.7–4.0)
MCHC: 33.8 g/dL (ref 30.0–36.0)
MCV: 93.1 fl (ref 78.0–100.0)
Monocytes Absolute: 0.8 10*3/uL (ref 0.1–1.0)
Monocytes Relative: 9.1 % (ref 3.0–12.0)
Neutro Abs: 5.8 10*3/uL (ref 1.4–7.7)
Neutrophils Relative %: 65.9 % (ref 43.0–77.0)
Platelets: 286 10*3/uL (ref 150.0–400.0)
RBC: 3.81 Mil/uL — ABNORMAL LOW (ref 3.87–5.11)
RDW: 14.1 % (ref 11.5–15.5)
WBC: 8.8 10*3/uL (ref 4.0–10.5)

## 2020-09-01 LAB — COMPREHENSIVE METABOLIC PANEL
ALT: 13 U/L (ref 0–35)
AST: 18 U/L (ref 0–37)
Albumin: 4 g/dL (ref 3.5–5.2)
Alkaline Phosphatase: 100 U/L (ref 39–117)
BUN: 10 mg/dL (ref 6–23)
CO2: 29 mEq/L (ref 19–32)
Calcium: 9.5 mg/dL (ref 8.4–10.5)
Chloride: 97 mEq/L (ref 96–112)
Creatinine, Ser: 0.56 mg/dL (ref 0.40–1.20)
GFR: 90.4 mL/min (ref >60.00)
Glucose, Bld: 88 mg/dL (ref 70–99)
Potassium: 4.6 mEq/L (ref 3.5–5.1)
Sodium: 134 mEq/L — ABNORMAL LOW (ref 135–145)
Total Bilirubin: 0.3 mg/dL (ref 0.2–1.2)
Total Protein: 7.4 g/dL (ref 6.0–8.3)

## 2020-09-01 LAB — FERRITIN: Ferritin: 44.8 ng/mL (ref 10.0–291.0)

## 2020-09-01 MED ORDER — DOXYCYCLINE MONOHYDRATE 100 MG PO TABS: 100.0000 mg | ORAL_TABLET | Freq: Two times a day (BID) | ORAL | 0 refills | Status: DC

## 2020-09-01 NOTE — Progress Notes (Signed)
Alexa Rios 75 y.o. 17-May-1946 [id number]  Assessment & Plan:   Encounter Diagnoses  Name Primary?  . Diverticulitis - recurrent  Yes  . Mild anemia   . Abnormal CT scan, pelvis-question Paget's disease    It sounds like she is having persistent diverticulitis problems as best I can tell.  The presentation has been a little strange the pain came on late in the course of this.  Keeping in mind the possibility of a colitis though I still think diverticulitis was the right diagnosis.  Go ahead and continue treatment with 10 days of doxycycline 100 mg twice daily and take metronidazole 500 mg twice daily.  Labs as below.  Dicyclomine 10 mg every 6 hours as needed side effects discussed she already has that on hand but had not used  She will see Amy Esterwood PA-C in a little over 2 weeks in early March and she knows to stay in touch with me for changes or other concerns that merit attention   Orders Placed This Encounter  Procedures  . CBC with Differential/Platelet  . Comprehensive metabolic panel  . Ferritin   Her CT scan on February 2 showed the following with respect to her bone in the pelvis.  I did not appreciate this until the patient left.  I do not think anybody else has discussed this and I am not sure how significant it is but it will need some follow-up.  I will let the patient know and she can discuss with Dr. Hassan Rowan at her March 11 visit with her.  Musculoskeletal: No aggressive appearing focal osseous lesions. Stable asymmetric trabecular and cortical thickening throughout the left iliac bone compatible with Paget's disease. Moderate lower lumbar spondylosis.      I appreciate the opportunity to care for this patient. CC: Wynn Banker, MD Amy Esterwood PA-C   Subjective:   Chief Complaint: Diverticulitis  HPI Alexa Rios is a 75 year old white woman with a history of IBS and GERD and dysphagia issues recently diagnosed with  diverticulitis requiring hospitalization.  She had presented with difficulty with defecation and was not really complaining of abdominal pain but then developed abdominal pain, saw Amy Esterwood PA-C and a January 28 CT scan was appropriately ordered and it demonstrated very inflamed distal sigmoid colon and a 3.1 x 1.2 x 1.9 cm fluid density thought to possibly represent an abscess.  She was admitted into the hospital for pain control helped and given IV antibiotics, follow-up CT on 08/20/2020 showed resolution of the abscess but she had persistent prominent inflammatory changes of sigmoid diverticulitis.  She had problems with a lot of nausea associated with the pain and antibiotics given were Augmentin generic, she stopped it and then restarted it and did continue with it and finished up her course just yesterday.  She had been noting improvement in the middle of last week but then started to have a downturn with increased pain and problems and last night was having quite a bit of cramps with some explosive gas with a small amount of loose stool.  She has not been febrile.  She did not use ondansetron for nausea because she was concerned about possible constipation.  She is eating quite well on a low residue diet at this time.  Very soft and bland foods.   Wt Readings from Last 3 Encounters:  09/01/20 136 lb 4 oz (61.8 kg)  08/17/20 137 lb 9.1 oz (62.4 kg)  08/14/20 137 lb 9.6 oz (62.4 kg)  CBC Latest Ref Rng & Units 09/01/2020 08/20/2020 08/19/2020  WBC 4.0 - 10.5 K/uL 8.8 4.1 8.3  Hemoglobin 12.0 - 15.0 g/dL 20.2 10.3(L) 11.6(L)  Hematocrit 36.0 - 46.0 % 35.5(L) 31.7(L) 35.8(L)  Platelets 150.0 - 400.0 K/uL 286.0 403(H) 422(H)   Lab Results  Component Value Date   VITAMINB12 439 03/14/2020   Seek Allergies  Allergen Reactions  . Sulfamethoxazole-Trimethoprim   . Sumatriptan     palpitations   Current Meds  Medication Sig  . BIOTIN 5000 PO Take 1 tablet by mouth in the morning and at  bedtime.   Marland Kitchen CALCIUM PO Take 1,200 mg by mouth daily.  Marland Kitchen dicyclomine (BENTYL) 10 MG capsule Take 1 capsule (10 mg total) by mouth every 6 (six) hours as needed for spasms.  Marland Kitchen doxycycline (ADOXA) 100 MG tablet Take 1 tablet (100 mg total) by mouth 2 (two) times daily for 10 days.  . Estradiol 1 MG/GM GEL Apply 1mg  twice a week as needed (Patient taking differently: Apply 1 application topically once a week. Apply twice weekly on Wednesday and Sundays)  . EVENING PRIMROSE OIL PO Take 500 mg by mouth daily.   . Glucosamine-Chondroitin (COSAMIN DS PO) Take 1 tablet by mouth daily.  10[birth year]1973 levothyroxine (SYNTHROID) 25 MCG tablet Take 1 tablet (25 mcg total) by mouth daily before breakfast.  . metroNIDAZOLE (FLAGYL) 500 MG tablet Take 1 tablet (500 mg total) by mouth 2 (two) times daily.  . Multiple Vitamin (MULTIVITAMIN ADULT PO) Take by mouth.  . ondansetron (ZOFRAN) 4 MG tablet Take 1 tablet (4 mg total) by mouth every 8 (eight) hours as needed for nausea or vomiting.  . polyethylene glycol (MIRALAX / GLYCOLAX) 17 g packet Take 17 g by mouth 3 (three) times daily.  . Potassium 99 MG TABS Take 1 tablet by mouth daily.  Marland Kitchen Ubrogepant (UBRELVY) 50 MG TABS Take 50 mg by mouth once as needed for up to 1 dose. (Patient taking differently: Take 50 mg by mouth daily as needed (For migraine).)   Past Medical History:  Diagnosis Date  . Colon polyps   . Diverticulitis   . GERD (gastroesophageal reflux disease)   . History of chicken pox   . Hypothyroidism   . Mesenteric adenitis   . Migraine   . Status post dilation of esophageal narrowing   . Thyroid disease    Past Surgical History:  Procedure Laterality Date  . BREAST BIOPSY Left 1976   benign per patient  . BREAST CYST ASPIRATION Right    30 + yrs ago  . CHOLECYSTECTOMY  2015  . COLONOSCOPY    . KIDNEY CYST REMOVAL    . REDUCTION MAMMAPLASTY Bilateral 2005  . TONSILLECTOMY AND ADENOIDECTOMY  1951  . VAGINAL HYSTERECTOMY  2000   benign path    Social History   Social History Narrative   Patient is widowed she is a retired 2006 and 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   Moved to [place] from [place] in May 2021   No alcohol   1 caffeinated beverage daily   Never smoker no drugs no other tobacco   family history includes Arthritis in her sister; Cancer in her brother; Diabetes in her brother and father; Early death in her father; Heart disease in her maternal grandmother and paternal grandmother; High Cholesterol in her maternal grandmother and paternal grandmother; Hyperlipidemia in her brother, father, mother, and sister; Lymphoma in her father; Prostate cancer in her paternal grandfather; Rheum arthritis in her  mother; Stroke in her mother and paternal grandfather.   Review of Systems See HPI  Objective:   Physical Exam BP 110/62 (BP Location: Left Arm, Patient Position: Sitting, Cuff Size: Normal)   Pulse 92   Ht 5' 3.5" (1.613 m)   Wt 136 lb 4 oz (61.8 kg)   BMI 23.76 kg/m  Thin well-developed well-nourished white woman in no acute distress Abdomen is soft and she has some mild tenderness in the lower periumbilical areas right and left bowel sounds are present without bruit

## 2020-09-01 NOTE — Patient Instructions (Addendum)
We have sent the following medications to your pharmacy for you to pick up at your convenience: Doxycyline  Re-start your flagyl and take your dicyclomine as needed.   Your provider has requested that you go to the basement level for lab work before leaving today. Press "B" on the elevator. The lab is located at the first door on the left as you exit the elevator.  Due to recent changes in healthcare laws, you may see the results of your imaging and laboratory studies on MyChart before your provider has had a chance to review them.  We understand that in some cases there may be results that are confusing or concerning to you. Not all laboratory results come back in the same time frame and the provider may be waiting for multiple results in order to interpret others.  Please give [place] 48 hours in order for your provider to thoroughly review all the results before contacting the office for clarification of your results.   Your next appointment will be with Mike Gip, PA-C on 09/19/2020 at 11:00AM  I appreciate the opportunity to care for you. Stan Head, MD, [facility]

## 2020-09-04 ENCOUNTER — Telehealth: Payer: Self-pay

## 2020-09-04 ENCOUNTER — Telehealth: Payer: Self-pay | Admitting: Internal Medicine

## 2020-09-04 ENCOUNTER — Other Ambulatory Visit: Payer: Self-pay

## 2020-09-04 DIAGNOSIS — K5792 Diverticulitis of intestine, part unspecified, without perforation or abscess without bleeding: Secondary | ICD-10-CM

## 2020-09-04 DIAGNOSIS — R1030 Lower abdominal pain, unspecified: Secondary | ICD-10-CM

## 2020-09-04 NOTE — Progress Notes (Signed)
c 

## 2020-09-04 NOTE — Telephone Encounter (Signed)
She messaged me last night through MyChart or yesterday and I responded, she was feeling worse she was not specific enough.  I told her to hold her doxycycline and metronidazole.  There was a lot of nausea.  She was asking about going back to generic Augmentin alone.  I have asked her to call back or send another MyChart message regarding her symptoms at this time.  She may need another CT scan due to her waxing and waning symptoms though it is hard to sort out what might be medication side effect

## 2020-09-04 NOTE — Telephone Encounter (Signed)
Scheduled patient for CT-abd/pelvis w/contrast at [facility] on 09/10/20, patient to arrive at 4:45pm and be NPO 4 hours before except for the contrast. She will pick-up the contrast at [facility] radiology and drink 1st bottle at 3:00pm and 2nd bottle at 4:00pm the day of the CT. Patient agrees with the above

## 2020-09-05 NOTE — Telephone Encounter (Signed)
Recommended:  1) dicyclomine 10-20 mg qid 2) stay on soft diet 3) if severe pain uncontrollable - to ED 4) stay off Abx as many side effects

## 2020-09-09 ENCOUNTER — Emergency Department (HOSPITAL_COMMUNITY)
Admission: EM | Admit: 2020-09-09 | Discharge: 2020-09-09 | Disposition: A | Discharge status: Home / Self Care | Payer: Medicare PPO | Attending: Emergency Medicine | Admitting: Emergency Medicine

## 2020-09-09 ENCOUNTER — Encounter (HOSPITAL_COMMUNITY): Payer: Self-pay

## 2020-09-09 DIAGNOSIS — Z5321 Procedure and treatment not carried out due to patient leaving prior to being seen by health care provider: Secondary | ICD-10-CM | POA: Diagnosis not present

## 2020-09-09 DIAGNOSIS — R110 Nausea: Secondary | ICD-10-CM | POA: Diagnosis not present

## 2020-09-09 DIAGNOSIS — R109 Unspecified abdominal pain: Secondary | ICD-10-CM | POA: Insufficient documentation

## 2020-09-09 LAB — URINALYSIS, ROUTINE W REFLEX MICROSCOPIC
Bacteria, UA: NONE SEEN
Bilirubin Urine: NEGATIVE
Glucose, UA: NEGATIVE mg/dL
Ketones, ur: NEGATIVE mg/dL
Leukocytes,Ua: NEGATIVE
Nitrite: NEGATIVE
Protein, ur: NEGATIVE mg/dL
Specific Gravity, Urine: 1.001 — ABNORMAL LOW (ref 1.005–1.030)
pH: 6 (ref 5.0–8.0)

## 2020-09-09 LAB — CBC
HCT: 36 % (ref 36.0–46.0)
Hemoglobin: 12 g/dL (ref 12.0–15.0)
MCH: 31.6 pg (ref 26.0–34.0)
MCHC: 33.3 g/dL (ref 30.0–36.0)
MCV: 94.7 fL (ref 80.0–100.0)
Platelets: 302 10*3/uL (ref 150–400)
RBC: 3.8 MIL/uL — ABNORMAL LOW (ref 3.87–5.11)
RDW: 13.2 % (ref 11.5–15.5)
WBC: 6.4 10*3/uL (ref 4.0–10.5)
nRBC: 0 % (ref 0.0–0.2)

## 2020-09-09 LAB — COMPREHENSIVE METABOLIC PANEL
ALT: 17 U/L (ref 0–44)
AST: 24 U/L (ref 15–41)
Albumin: 3.9 g/dL (ref 3.5–5.0)
Alkaline Phosphatase: 97 U/L (ref 38–126)
Anion gap: 5 (ref 5–15)
BUN: 13 mg/dL (ref 8–23)
CO2: 32 mmol/L (ref 22–32)
Calcium: 9.3 mg/dL (ref 8.9–10.3)
Chloride: 97 mmol/L — ABNORMAL LOW (ref 98–111)
Creatinine, Ser: 0.61 mg/dL (ref 0.44–1.00)
GFR, Estimated: >60 mL/min (ref >60)
Glucose, Bld: 104 mg/dL — ABNORMAL HIGH (ref 70–99)
Potassium: 4.1 mmol/L (ref 3.5–5.1)
Sodium: 134 mmol/L — ABNORMAL LOW (ref 135–145)
Total Bilirubin: 0.2 mg/dL — ABNORMAL LOW (ref 0.3–1.2)
Total Protein: 7.1 g/dL (ref 6.5–8.1)

## 2020-09-09 LAB — LIPASE, BLOOD: Lipase: 37 U/L (ref 11–51)

## 2020-09-09 NOTE — ED Triage Notes (Signed)
Pt presents with c/o abdominal pain secondary to an abscess on the wall of her colon. Pt was recently admitted for same. Pt reports the pain is higher this time than it was last time, burning in nature. Pt also c/o nausea.

## 2020-09-09 NOTE — ED Notes (Signed)
Called 3x for room placement.

## 2020-09-10 ENCOUNTER — Ambulatory Visit (HOSPITAL_COMMUNITY)
Admission: RE | Admit: 2020-09-10 | Discharge: 2020-09-10 | Disposition: A | Discharge status: Home / Self Care | Payer: Medicare PPO | Source: Ambulatory Visit | Attending: Internal Medicine | Admitting: Internal Medicine

## 2020-09-10 ENCOUNTER — Other Ambulatory Visit: Payer: Self-pay

## 2020-09-10 DIAGNOSIS — K5792 Diverticulitis of intestine, part unspecified, without perforation or abscess without bleeding: Secondary | ICD-10-CM | POA: Diagnosis present

## 2020-09-10 DIAGNOSIS — R1030 Lower abdominal pain, unspecified: Secondary | ICD-10-CM | POA: Insufficient documentation

## 2020-09-10 MED ORDER — IOHEXOL 300 MG/ML  SOLN
100.0000 mL | Freq: Once | INTRAMUSCULAR | Status: AC | PRN
  Administered 2020-09-10: 100 mL via INTRAVENOUS

## 2020-09-10 MED ORDER — IOHEXOL 9 MG/ML PO SOLN
1000.0000 mL | ORAL | Status: AC
Start: 2020-09-10 — End: 2020-09-10
  Administered 2020-09-10: 1000 mL via ORAL

## 2020-09-11 ENCOUNTER — Encounter: Payer: Self-pay | Admitting: Internal Medicine

## 2020-09-11 ENCOUNTER — Ambulatory Visit: Payer: Medicare PPO | Admitting: Internal Medicine

## 2020-09-11 VITALS — BP 119/68 | HR 81 | Ht 65.0 in | Wt 137.0 lb

## 2020-09-11 DIAGNOSIS — R1013 Epigastric pain: Secondary | ICD-10-CM

## 2020-09-11 DIAGNOSIS — R5383 Other fatigue: Secondary | ICD-10-CM | POA: Diagnosis not present

## 2020-09-11 DIAGNOSIS — K5732 Diverticulitis of large intestine without perforation or abscess without bleeding: Secondary | ICD-10-CM

## 2020-09-11 DIAGNOSIS — M8888 Osteitis deformans of other bones: Secondary | ICD-10-CM | POA: Insufficient documentation

## 2020-09-11 DIAGNOSIS — I700 Atherosclerosis of aorta: Secondary | ICD-10-CM

## 2020-09-11 NOTE — Progress Notes (Signed)
Alexa Rios 74 y.o. [birth year] [id number]  Assessment & Plan:   Encounter Diagnoses  Name Primary?  . Dyspepsia Yes  . Diverticulitis of colon - improved/resolved   . Fatigue, unspecified type   . Abdominal aortic atherosclerosis (HCC)   . Paget's disease of bony pelvis      Continue supportive care.  The CT findings are reassuring.  I think she has had a lot of medication side effects and she has been fatigued and weak etc. related to recovery from this illness which was somewhat prolonged.  No worrisome signs at this time.  Though she does have abdominal aortic atherosclerosis I do not think this is relevant with regards to her problems as I am not suspicious of mesenteric ischemia.  We will keep this in mind but I do not think that is likely.  Plan for EGD and colonoscopy.  This will be done in early May when she is able to have her son help.  I think she needs a repeat colonoscopy because of the diverticulitis and because she is having a flare of upper GI symptoms we will go ahead and check for gastritis etc. with EGD.  The risks and benefits as well as alternatives of endoscopic procedure(s) have been discussed and reviewed. All questions answered. The patient agrees to proceed.  Trial of FD guard versus IBgard for dyspepsia  She was aware that she probably had Paget's disease which has been seen on the CT scanning.  She will follow-up with Dr. Link Snuffer about this.  I appreciate the opportunity to care for this patient. CC: [name], MD  Subjective:   Chief Complaint: Abdominal pain history of diverticulitis  HPI Alexa Rios is here worked in today, she had a CT scan yesterday the results were not in as of yet but that was read while she was here, and I have viewed the images personally.  Recall that she has had sigmoid diverticulitis with abscess and was hospitalized briefly for IV antibiotics in early February late January.  She has had a tough time with  nausea vomiting with different antibiotics and has not felt great.  She went to the [facility], ER yesterday prior to having her CT scan which was scheduled as an outpatient because of pain but left after a several hour wait.  That abdominal pain is much better but she has been having upper abdominal pain after eating.  Some nausea and distress has felt weak sometimes after she eats she feels all wiped out and lightheaded and sweaty.  She has probably had the symptoms at some time in her past as well but not chronically.  She is a little frustrated with how she is feeling and how she is not all the way better yet.  She reminds me that she had her deceased husband used to hike 4 to 6 miles a day at times [place].  She tried to walk the other day and only got about  7/10 of a mile before she had to turn around to go back to her car and lie down because she was out of breath.  Also feeling unwell in the epigastrium.  CT scan demonstrated diminished stranding around the sigmoid colon no abscess a moderate-sized periampullary duodenal diverticulum, chronic renal scarring, changes consistent with Paget's disease and atherosclerosis of the aorta.  This is all known.  Nothing new. Allergies  Allergen Reactions  . Sulfamethoxazole-Trimethoprim   . Sumatriptan  palpitations   Current Meds  Medication Sig  . CALCIUM PO Take 1,200 mg by mouth daily.  Marland Kitchen dicyclomine (BENTYL) 10 MG capsule Take 1 capsule (10 mg total) by mouth every 6 (six) hours as needed for spasms.  Marland Kitchen esomeprazole (NEXIUM 24HR) 20 MG capsule Take 20 mg by mouth daily at 12 noon.  . Estradiol 1 MG/GM GEL Apply 1mg  twice a week as needed (Patient taking differently: Apply 1 application topically once a week. Apply twice weekly on Wednesday and Sundays)  . levothyroxine (SYNTHROID) 25 MCG tablet Take 1 tablet (25 mcg total) by mouth daily before breakfast.  . Multiple Vitamin (MULTIVITAMIN ADULT PO) Take by mouth.  .  [DISCONTINUED] doxycycline (ADOXA) 100 MG tablet Take 1 tablet (100 mg total) by mouth 2 (two) times daily for 10 days.  . [DISCONTINUED] metroNIDAZOLE (FLAGYL) 500 MG tablet Take 1 tablet (500 mg total) by mouth 2 (two) times daily.   Past Medical History:  Diagnosis Date  . Colon polyps   . Diverticulitis   . GERD (gastroesophageal reflux disease)   . History of chicken pox   . Hypothyroidism   . Mesenteric adenitis   . Migraine   . Paget's disease of bony pelvis   . Status post dilation of esophageal narrowing   . Thyroid disease    Past Surgical History:  Procedure Laterality Date  . BREAST BIOPSY Left 1976   benign per patient  . BREAST CYST ASPIRATION Right    30 + yrs ago  . CHOLECYSTECTOMY  2015  . COLONOSCOPY    . KIDNEY CYST REMOVAL    . REDUCTION MAMMAPLASTY Bilateral 2005  . TONSILLECTOMY AND ADENOIDECTOMY  1951  . VAGINAL HYSTERECTOMY  2000   benign path   Social History   Social History Narrative   Patient is widowed she is a retired 2006 and 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   Moved to [place] from [place] in May 2021   No alcohol   1 caffeinated beverage daily   Never smoker no drugs no other tobacco   family history includes Arthritis in her sister; Cancer in her brother; Diabetes in her brother and father; Early death in her father; Heart disease in her maternal grandmother and paternal grandmother; High Cholesterol in her maternal grandmother and paternal grandmother; Hyperlipidemia in her brother, father, mother, and sister; Lymphoma in her father; Prostate cancer in her paternal grandfather; Rheum arthritis in her mother; Stroke in her mother and paternal grandfather.   Review of Systems As per HPI  Objective:   Physical Exam BP 119/68   Pulse 81   Ht 5\' 5"  (1.651 m)   Wt 137 lb (62.1 kg)   SpO2 99%   BMI 22.80 kg/m  Thin but well-developed well-nourished white woman no acute distress Lungs cta Cor NL abd mild epigastric tenderness  bowel sounds present no organomegaly or mass no bruits

## 2020-09-11 NOTE — Patient Instructions (Addendum)
We are providing you with IBgard and FDgard samples. Take 2 capsules before meals.  You have been scheduled for an endoscopy and colonoscopy. Please follow the written instructions given to you at your visit today. Please pick up your prep supplies at the pharmacy within the next 1-3 days. If you use inhalers (even only as needed), please bring them with you on the day of your procedure.  Due to recent changes in healthcare laws, you may see the results of your imaging and laboratory studies on MyChart before your provider has had a chance to review them.  We understand that in some cases there may be results that are confusing or concerning to you. Not all laboratory results come back in the same time frame and the provider may be waiting for multiple results in order to interpret others.  Please give [place] 48 hours in order for your provider to thoroughly review all the results before contacting the office for clarification of your results.    I appreciate the opportunity to care for you. Stan Head, MD, [facility]

## 2020-09-15 ENCOUNTER — Other Ambulatory Visit: Payer: Self-pay

## 2020-09-15 MED ORDER — AMOXICILLIN-POT CLAVULANATE 875-125 MG PO TABS: 1.0000 | ORAL_TABLET | Freq: Two times a day (BID) | ORAL | 0 refills | Status: AC

## 2020-09-19 ENCOUNTER — Ambulatory Visit: Payer: Medicare PPO | Admitting: Physician Assistant

## 2020-09-23 ENCOUNTER — Ambulatory Visit: Payer: Medicare PPO | Admitting: Internal Medicine

## 2020-09-26 ENCOUNTER — Encounter: Payer: Medicare PPO | Admitting: Family Medicine

## 2020-09-30 ENCOUNTER — Other Ambulatory Visit (HOSPITAL_COMMUNITY): Payer: Self-pay | Admitting: Internal Medicine

## 2020-09-30 DIAGNOSIS — R0602 Shortness of breath: Secondary | ICD-10-CM

## 2020-10-13 DIAGNOSIS — M8888 Osteitis deformans of other bones: Secondary | ICD-10-CM | POA: Insufficient documentation

## 2020-10-14 ENCOUNTER — Ambulatory Visit
Admission: RE | Admit: 2020-10-14 | Discharge: 2020-10-14 | Disposition: A | Discharge status: Home / Self Care | Payer: Medicare PPO | Source: Ambulatory Visit | Attending: Family Medicine | Admitting: Family Medicine

## 2020-10-14 ENCOUNTER — Other Ambulatory Visit: Payer: Self-pay

## 2020-10-14 DIAGNOSIS — E2839 Other primary ovarian failure: Secondary | ICD-10-CM

## 2020-10-23 ENCOUNTER — Other Ambulatory Visit (HOSPITAL_COMMUNITY): Payer: Medicare PPO

## 2020-10-31 ENCOUNTER — Ambulatory Visit (HOSPITAL_COMMUNITY): Payer: Medicare PPO | Attending: Cardiology

## 2020-10-31 ENCOUNTER — Other Ambulatory Visit: Payer: Self-pay

## 2020-10-31 DIAGNOSIS — R0602 Shortness of breath: Secondary | ICD-10-CM | POA: Diagnosis present

## 2020-10-31 LAB — ECHOCARDIOGRAM COMPLETE
Area-P 1/2: 3.27 cm2
S' Lateral: 2.9 cm

## 2020-11-04 ENCOUNTER — Other Ambulatory Visit (HOSPITAL_COMMUNITY): Payer: Self-pay | Admitting: Internal Medicine

## 2020-11-04 DIAGNOSIS — R0602 Shortness of breath: Secondary | ICD-10-CM

## 2020-11-11 ENCOUNTER — Other Ambulatory Visit: Payer: Self-pay

## 2020-11-11 ENCOUNTER — Emergency Department (HOSPITAL_COMMUNITY)
Admission: EM | Admit: 2020-11-11 | Discharge: 2020-11-11 | Disposition: A | Discharge status: Home / Self Care | Payer: Medicare PPO | Attending: Emergency Medicine | Admitting: Emergency Medicine

## 2020-11-11 ENCOUNTER — Other Ambulatory Visit (HOSPITAL_COMMUNITY): Payer: Medicare PPO

## 2020-11-11 ENCOUNTER — Ambulatory Visit (HOSPITAL_COMMUNITY)
Admission: EM | Admit: 2020-11-11 | Discharge: 2020-11-11 | Disposition: A | Discharge status: Home / Self Care | Payer: Medicare PPO

## 2020-11-11 DIAGNOSIS — R5383 Other fatigue: Secondary | ICD-10-CM | POA: Insufficient documentation

## 2020-11-11 DIAGNOSIS — H538 Other visual disturbances: Secondary | ICD-10-CM | POA: Insufficient documentation

## 2020-11-11 DIAGNOSIS — E039 Hypothyroidism, unspecified: Secondary | ICD-10-CM | POA: Diagnosis not present

## 2020-11-11 DIAGNOSIS — R202 Paresthesia of skin: Secondary | ICD-10-CM | POA: Diagnosis not present

## 2020-11-11 DIAGNOSIS — Z79899 Other long term (current) drug therapy: Secondary | ICD-10-CM | POA: Diagnosis not present

## 2020-11-11 DIAGNOSIS — R55 Syncope and collapse: Secondary | ICD-10-CM | POA: Diagnosis not present

## 2020-11-11 DIAGNOSIS — R42 Dizziness and giddiness: Secondary | ICD-10-CM | POA: Diagnosis present

## 2020-11-11 DIAGNOSIS — R002 Palpitations: Secondary | ICD-10-CM | POA: Insufficient documentation

## 2020-11-11 LAB — PHOSPHORUS: Phosphorus: 3.6 mg/dL (ref 2.5–4.6)

## 2020-11-11 LAB — CBC WITH DIFFERENTIAL/PLATELET
Abs Immature Granulocytes: 0 10*3/uL (ref 0.00–0.07)
Basophils Absolute: 0 10*3/uL (ref 0.0–0.1)
Basophils Relative: 1 %
Eosinophils Absolute: 0.1 10*3/uL (ref 0.0–0.5)
Eosinophils Relative: 2 %
HCT: 37.9 % (ref 36.0–46.0)
Hemoglobin: 12.6 g/dL (ref 12.0–15.0)
Immature Granulocytes: 0 %
Lymphocytes Relative: 37 %
Lymphs Abs: 2.1 10*3/uL (ref 0.7–4.0)
MCH: 32 pg (ref 26.0–34.0)
MCHC: 33.2 g/dL (ref 30.0–36.0)
MCV: 96.2 fL (ref 80.0–100.0)
Monocytes Absolute: 0.5 10*3/uL (ref 0.1–1.0)
Monocytes Relative: 10 %
Neutro Abs: 2.9 10*3/uL (ref 1.7–7.7)
Neutrophils Relative %: 50 %
Platelets: 252 10*3/uL (ref 150–400)
RBC: 3.94 MIL/uL (ref 3.87–5.11)
RDW: 13.1 % (ref 11.5–15.5)
WBC: 5.6 10*3/uL (ref 4.0–10.5)
nRBC: 0 % (ref 0.0–0.2)

## 2020-11-11 LAB — COMPREHENSIVE METABOLIC PANEL
ALT: 23 U/L (ref 0–44)
AST: 28 U/L (ref 15–41)
Albumin: 4 g/dL (ref 3.5–5.0)
Alkaline Phosphatase: 99 U/L (ref 38–126)
Anion gap: 7 (ref 5–15)
BUN: 9 mg/dL (ref 8–23)
CO2: 28 mmol/L (ref 22–32)
Calcium: 9.6 mg/dL (ref 8.9–10.3)
Chloride: 100 mmol/L (ref 98–111)
Creatinine, Ser: 0.67 mg/dL (ref 0.44–1.00)
GFR, Estimated: >60 mL/min (ref >60)
Glucose, Bld: 95 mg/dL (ref 70–99)
Potassium: 4.3 mmol/L (ref 3.5–5.1)
Sodium: 135 mmol/L (ref 135–145)
Total Bilirubin: 0.5 mg/dL (ref 0.3–1.2)
Total Protein: 6.7 g/dL (ref 6.5–8.1)

## 2020-11-11 LAB — URINALYSIS, ROUTINE W REFLEX MICROSCOPIC
Bilirubin Urine: NEGATIVE
Glucose, UA: NEGATIVE mg/dL
Ketones, ur: NEGATIVE mg/dL
Leukocytes,Ua: NEGATIVE
Nitrite: NEGATIVE
Protein, ur: NEGATIVE mg/dL
Specific Gravity, Urine: 1.003 — ABNORMAL LOW (ref 1.005–1.030)
pH: 7 (ref 5.0–8.0)

## 2020-11-11 LAB — MAGNESIUM: Magnesium: 2.1 mg/dL (ref 1.7–2.4)

## 2020-11-11 LAB — T4, FREE: Free T4: 0.97 ng/dL (ref 0.61–1.12)

## 2020-11-11 LAB — LIPASE, BLOOD: Lipase: 35 U/L (ref 11–51)

## 2020-11-11 LAB — TSH: TSH: 3.225 u[IU]/mL (ref 0.350–4.500)

## 2020-11-11 LAB — TROPONIN I (HIGH SENSITIVITY): Troponin I (High Sensitivity): 3 ng/L (ref <18)

## 2020-11-11 NOTE — ED Notes (Signed)
MD at bedside. 

## 2020-11-11 NOTE — ED Triage Notes (Signed)
Emergency Medicine Provider Triage Evaluation Note  Alexa Rios , a 75 y.o. female  was evaluated in triage.  Pt complains of weakness.  Review of Systems  Positive: Tingling sensation, global weakness, head pressure, lighteadedness Negative: Fever, cp, cough, sob, abd pain, dysuria  Physical Exam  BP 114/81 (BP Location: Left Arm)   Pulse 81   Temp 98.6 F (37 C) (Oral)   Resp 12   SpO2 99%  Gen:   Awake, no distress   HEENT:  Atraumatic  Resp:  Normal effort  Cardiac:  Normal rate  Abd:   Nondistended, nontender  MSK:   Moves extremities without difficulty  Neuro:  Speech clear   Medical Decision Making  Medically screening exam initiated at 5:10 PM.  Appropriate orders placed.  Mirna Mires was informed that the remainder of the evaluation will be completed by another provider, this initial triage assessment does not replace that evaluation, and the importance of remaining in the ED until their evaluation is complete.  Clinical Impression  Endorse gen fatigue and weakness with tingling sensation to her extremities.  Had had diverticular abscess early in the year requiring hospitalization and is still trying to recover from that.  Currently taking PPI and was on abx.    Fayrene Helper, PA-C 11/11/20 1712

## 2020-11-11 NOTE — ED Triage Notes (Signed)
Pt presents with sweats, shaking, and weakness feeling. States has had an episode of vision going black approx 2 weeks ago while driving.   Patient is being discharged from the Urgent Care and sent to the Emergency Department via POV. Per J Defelice, PA, patient is in need of higher level of care due to black vision episodes. Patient is aware and verbalizes understanding of plan of care.   Vitals:   11/11/20 1554  BP: 126/82  Pulse: 80  Resp: 15  Temp: 98.8 F (37.1 C)  SpO2: 100%

## 2020-11-11 NOTE — ED Provider Notes (Signed)
[facility] EMERGENCY DEPARTMENT Provider Note   CSN: [id number] Arrival date & time: 11/11/20  1609     History CC:  Lightheadedness  Alexa Rios is a 75 y.o. female with a history of reflux, recurrent diverticulitis, prior tachycardia, presenting to the ED with episodes of palpitations lightheadedness.  The patient reports she has been on 6 weeks of interchange antibiotics for recurrent diverticulitis, including Augmentin, Cipro and Flagyl.  She is finished with this.  She is no longer having abdominal pain, it is eating and stooling normally.  She reports she has been having intermittent fatigue since her episodes of diverticulitis.  Normally she is very active and walks multiple miles per day, but some difficulty doing that due to generalized fatigue.  Yesterday she was able to complete a 4 mile walk with no symptoms.  She is concerned because she has also been having episodes at random of palpitations, lightheadedness.  She says it can occur with blurred vision as well.  She also reports most episodes occur, she will be flushed with sweat, and have tingling in her fingers and toes.  She had an episode recently when she was driving and again began feeling like she had tunnel vision.  She pulled over to the side of the road without incidence.  These episodes can last a few minutes at a time and are not triggered or associated with activity or exertion.   She currently feels near her baseline.  She reports she had an echocardiogram done 2 weeks ago by her PCP, but has not received the results.  She has not seen a cardiologist in many years.  She denies any history of MI or cardiac disease.  She denies history of smoking, hypertension, hyperlipidemia, diabetes.  She reports that she was tested for "tachycardia with a monitor" years ago, about 7 years ago in [place].  Echo from 11/03/20 showing EF 60-65%, no regional wall motion abnormalities, no significant  regurgitation or aortic stenosis.   HPI     Past Medical History:  Diagnosis Date  . Colon polyps   . Diverticulitis   . GERD (gastroesophageal reflux disease)   . History of chicken pox   . Hypothyroidism   . Mesenteric adenitis   . Migraine   . Paget's disease of bony pelvis   . Status post dilation of esophageal narrowing   . Thyroid disease     Patient Active Problem List   Diagnosis Date Noted  . Paget's disease of bony pelvis   . Mild anemia 09/01/2020  . Abnormal CT scan, pelvis-question Paget's disease 09/01/2020  . Diverticulitis 08/16/2020  . Chronic constipation 07/31/2020  . Trigeminal neuralgia of right side of face 03/15/2020  . Occipital neuralgia of right side 03/15/2020  . Diverticulosis 03/15/2020  . Migraine headache with aura 03/15/2020  . Hypothyroid 03/15/2020  . Environmental and seasonal allergies 03/15/2020  . Heartburn 03/15/2020    Past Surgical History:  Procedure Laterality Date  . BREAST BIOPSY Left 1976   benign per patient  . BREAST CYST ASPIRATION Right    30 + yrs ago  . CHOLECYSTECTOMY  2015  . COLONOSCOPY    . KIDNEY CYST REMOVAL    . REDUCTION MAMMAPLASTY Bilateral 2005  . TONSILLECTOMY AND ADENOIDECTOMY  1951  . VAGINAL HYSTERECTOMY  2000   benign path     OB History   No obstetric history on file.     Family History  Problem Relation Age of Onset  .  Rheum arthritis Mother   . Hyperlipidemia Mother   . Stroke Mother   . Lymphoma Father   . Diabetes Father   . Early death Father   . Hyperlipidemia Father   . Cancer Brother        ureteral  . Diabetes Brother   . Hyperlipidemia Brother   . Arthritis Sister   . Hyperlipidemia Sister   . Heart disease Maternal Grandmother   . High Cholesterol Maternal Grandmother   . Heart disease Paternal Grandmother   . High Cholesterol Paternal Grandmother   . Prostate cancer Paternal Grandfather   . Stroke Paternal Grandfather   . Colon cancer Neg Hx   . Pancreatic  cancer Neg Hx   . Esophageal cancer Neg Hx     Social History   Tobacco Use  . Smoking status: Never Smoker  . Smokeless tobacco: Never Used  Vaping Use  . Vaping Use: Never used  Substance Use Topics  . Alcohol use: Not Currently  . Drug use: Never    Home Medications Prior to Admission medications   Medication Sig Start Date End Date Taking? Authorizing Provider  BIOTIN 5000 PO Take 1 tablet by mouth in the morning and at bedtime.  Patient not taking: Reported on 09/11/2020    [provider]  CALCIUM PO Take 1,200 mg by mouth daily.    [provider]  dicyclomine (BENTYL) 10 MG capsule Take 1 capsule (10 mg total) by mouth every 6 (six) hours as needed for spasms. 09/01/20   Iva BoopGessner, Carl E, MD  esomeprazole (NEXIUM 24HR) 20 MG capsule Take 20 mg by mouth daily at 12 noon.    [provider]  Estradiol 1 MG/GM GEL Apply 1mg  twice a week as needed Patient taking differently: Apply 1 application topically once a week. Apply twice weekly on Wednesday and Sundays 04/09/20   Wynn BankerKoberlein, Junell C, MD  EVENING PRIMROSE OIL PO Take 500 mg by mouth daily.  Patient not taking: Reported on 09/11/2020    [provider]  Glucosamine-Chondroitin (COSAMIN DS PO) Take 1 tablet by mouth daily. Patient not taking: Reported on 09/11/2020    [provider]  levothyroxine (SYNTHROID) 25 MCG tablet Take 1 tablet (25 mcg total) by mouth daily before breakfast. 08/20/20   Lewie ChamberGirguis, David, MD  Multiple Vitamin (MULTIVITAMIN ADULT PO) Take by mouth.    [provider]  ondansetron (ZOFRAN) 4 MG tablet Take 1 tablet (4 mg total) by mouth every 8 (eight) hours as needed for nausea or vomiting. Patient not taking: Reported on 09/11/2020 08/27/20   Iva BoopGessner, Carl E, MD  polyethylene glycol (MIRALAX / GLYCOLAX) 17 g packet Take 17 g by mouth 3 (three) times daily. Patient not taking: Reported on 09/11/2020    [provider]  Potassium 99 MG TABS Take 1  tablet by mouth daily. Patient not taking: Reported on 09/11/2020    [provider]  Ubrogepant (UBRELVY) 50 MG TABS Take 50 mg by mouth once as needed for up to 1 dose. Patient not taking: Reported on 09/11/2020 03/14/20   Wynn BankerKoberlein, Junell C, MD    Allergies    Sulfamethoxazole-trimethoprim and Sumatriptan  Review of Systems   Review of Systems  Constitutional: Negative for chills and fever.  HENT: Negative for ear pain and sore throat.   Eyes: Positive for visual disturbance. Negative for photophobia and pain.  Respiratory: Negative for cough and shortness of breath.   Cardiovascular: Negative for chest pain and palpitations.  Gastrointestinal:  Negative for abdominal pain and vomiting.  Genitourinary: Negative for dysuria and hematuria.  Musculoskeletal: Negative for arthralgias and back pain.  Skin: Negative for color change and rash.  Neurological: Positive for light-headedness, numbness and headaches. Negative for syncope.  All other systems reviewed and are negative.   Physical Exam Updated Vital Signs BP 117/88 (BP Location: Right Arm)   Pulse 77   Temp 98.6 F (37 C) (Oral)   Resp 19   Ht 5\' 5"  (1.651 m)   Wt 60.8 kg   SpO2 99%   BMI 22.30 kg/m   Physical Exam Constitutional:      General: She is not in acute distress. HENT:     Head: Normocephalic and atraumatic.  Eyes:     Conjunctiva/sclera: Conjunctivae normal.     Pupils: Pupils are equal, round, and reactive to light.  Neck:     Vascular: No carotid bruit.  Cardiovascular:     Rate and Rhythm: Normal rate and regular rhythm.     Pulses: Normal pulses.  Pulmonary:     Effort: Pulmonary effort is normal. No respiratory distress.  Abdominal:     General: Bowel sounds are normal. There is no distension.     Tenderness: There is no abdominal tenderness.  Musculoskeletal:     Cervical back: No rigidity or tenderness.  Skin:    General: Skin is warm and dry.  Neurological:     General: No  focal deficit present.     Mental Status: She is alert and oriented to person, place, and time. Mental status is at baseline.  Psychiatric:        Mood and Affect: Mood normal.        Behavior: Behavior normal.     ED Results / Procedures / Treatments   Labs (all labs ordered are listed, but only abnormal results are displayed) Labs Reviewed  URINALYSIS, ROUTINE W REFLEX MICROSCOPIC - Abnormal; Notable for the following components:      Result Value   Color, Urine STRAW (*)    APPearance CLOUDY (*)    Specific Gravity, Urine 1.003 (*)    Hgb urine dipstick MODERATE (*)    Bacteria, UA RARE (*)    All other components within normal limits  URINE CULTURE  CBC WITH DIFFERENTIAL/PLATELET  COMPREHENSIVE METABOLIC PANEL  LIPASE, BLOOD  MAGNESIUM  PHOSPHORUS  T4, FREE  TSH  TROPONIN I (HIGH SENSITIVITY)    EKG EKG Interpretation  Date/Time:  Tuesday November 11 2020 16:41:25 EDT Ventricular Rate:  79 PR Interval:  184 QRS Duration: 74 QT Interval:  370 QTC Calculation: 424 R Axis:   78 Text Interpretation: Normal sinus rhythm No STEMI Confirmed by 08-30-1969 [phone]) on 11/11/2020 5:58:13 PM   Radiology No results found.  Procedures Procedures   Medications Ordered in ED Medications - No data to display  ED Course  I have reviewed the triage vital signs and the nursing notes.  Pertinent labs & imaging results that were available during my care of the patient were reviewed by me and considered in my medical decision making (see chart for details).  This patient complains of lightheadedness episodes, tunnel vision, fatigue.  This involves an extensive number of treatment options, and is a complaint that carries with it a high risk of complications and morbidity.  The differential diagnosis includes electrolyte deficiency vs arrhythmia vs anemia vs vascular disease vs other  Doubt TIA/stroke with this presentation. Low risk for ACS.  ECG is normal here -  no  prolonged QTc or heart block.  Tele reviewed and normal.  Echo reviewed prior records - normal.  No significant Aortic stenosis or cardiac disease noted.  I ordered, reviewed, and interpreted labs - UA with moderate hgb, no leuks, no nitrites, CMP unremarkable, Trop 3, Mg 2.1, TSH 3.225, T4 normal.  I independently visualized and interpreted imaging which showed no life-threatening abnormalities, and the monitor tracing which showed NSR  Prior records reviewed including recent outpatient echocardiogram, which was unremarkable, earlier this month.  Following this workup, I had a lower suspicion for PE, ACS, anemia, stroke, CVA.  Discussed cardiology referral and follow up for possible cardiac monitoring and consideration of vascular carotid ultrasound - although her symptoms are not associated with exertion, and this seems less likely an acute cause.    With her having minimal symptoms here, her blood counts normal, and a very recent echocardiogram being unremarkable, I do think outpatient work up is reasonable, and she is reasonably safe for discharge.  She verbalizes agreement and wishes to go home.   Clinical Course as of 11/11/20 2201  Tue Nov 11, 2020  1953 Labs reviewed and largely unremarkable.  Trop 3 with > 12 hours symptoms, doubt ACS.  Mg and K normal.   [MT]  1956 Patient reports that she always has a small amount of blood in her urine.  She is not having any UTI symptoms.  I think we should avoid another course of antibiotics given her extensive recent history.  She is in agreement.  I will place a referral to cardiology for possible cardiac monitor for arrhythmia.  I would also recommend PCP or cardiologist obtaining carotid ultrasound/duplex imaging as outpatient.  She is asymptomatic for now.  Okay for discharge. [MT]    Clinical Course User Index [MT] Kaydra Borgen, Kermit Balo, MD   Final Clinical Impression(s) / ED Diagnoses Final diagnoses:  Near syncope    Rx / DC Orders ED  Discharge Orders         Ordered    Ambulatory referral to Cardiology       Comments: Near syncope seen in ED.  Advised cardiology evaluation for possible arrhythmia/heart monitor, and consideration of carotid doppler   11/11/20 2011           Terald Sleeper, MD 11/11/20 2202

## 2020-11-11 NOTE — Discharge Instructions (Signed)
If you feel dizzy again in the future, try to drink some juice or something with sugar and lay down.  If you are able to put your legs up.  Call 911.  I recommend that you follow-up with a heart doctor to see if you may be having an arrhythmia, or any irregular heart rhythm causing your symptoms.  You may need to wear a heart monitor as an outpatient.  I would also recommend that your heart doctor or your primary care doctor order an outpatient ultrasound of your carotid arteries, in your neck, to make sure there are no blockages here.  If you do not hear from the cardiologist office in 2 business days, please call the number above and ask for the next available appointment.

## 2020-11-11 NOTE — ED Triage Notes (Signed)
Pt presents to ED POV. Pt c/oepisodes weakness, tingling, and tunnel vision. Pt reports that it began 2w ago. Pt reports that she started PPI 2w ago.

## 2020-11-13 LAB — URINE CULTURE: Culture: NO GROWTH

## 2020-11-19 ENCOUNTER — Encounter: Payer: Self-pay | Admitting: Internal Medicine

## 2020-11-20 ENCOUNTER — Encounter: Payer: Self-pay | Admitting: Certified Registered Nurse Anesthetist

## 2020-11-21 ENCOUNTER — Other Ambulatory Visit: Payer: Self-pay

## 2020-11-21 ENCOUNTER — Ambulatory Visit (AMBULATORY_SURGERY_CENTER): Payer: Medicare PPO | Admitting: Internal Medicine

## 2020-11-21 ENCOUNTER — Encounter: Payer: Self-pay | Admitting: Internal Medicine

## 2020-11-21 VITALS — BP 90/61 | HR 66 | Temp 98.4°F | Resp 15 | Ht 65.0 in | Wt 137.0 lb

## 2020-11-21 DIAGNOSIS — R1013 Epigastric pain: Secondary | ICD-10-CM | POA: Diagnosis not present

## 2020-11-21 DIAGNOSIS — K5732 Diverticulitis of large intestine without perforation or abscess without bleeding: Secondary | ICD-10-CM | POA: Diagnosis not present

## 2020-11-21 MED ORDER — SODIUM CHLORIDE 0.9 % IV SOLN: 500.0000 mL | Freq: Once | INTRAVENOUS | Status: DC

## 2020-11-21 NOTE — Op Note (Signed)
[facility] Patient Name: Alexa Rios Procedure Date: 11/21/2020 10:08 AM MRN: [id number] Endoscopist: Iva Boop , MD Age: 75 Referring MD:  Date of Birth: 1946-04-05 Gender: Female Account #: [id number] Procedure:                Upper GI endoscopy Indications:              Dyspepsia Medicines:                Propofol per Anesthesia, Monitored Anesthesia Care Procedure:                Pre-Anesthesia Assessment:                           - Prior to the procedure, a History and Physical                            was performed, and patient medications and                            allergies were reviewed. The patient's tolerance of                            previous anesthesia was also reviewed. The risks                            and benefits of the procedure and the sedation                            options and risks were discussed with the patient.                            All questions were answered, and informed consent                            was obtained. Prior Anticoagulants: The patient has                            taken no previous anticoagulant or antiplatelet                            agents. ASA Grade Assessment: II - A patient with                            mild systemic disease. After reviewing the risks                            and benefits, the patient was deemed in                            satisfactory condition to undergo the procedure.                           After obtaining informed consent, the endoscope was  passed under direct vision. Throughout the                            procedure, the patient's blood pressure, pulse, and                            oxygen saturations were monitored continuously. The                            Endoscope was introduced through the mouth, and                            advanced to the second part of duodenum. The upper                            GI endoscopy was  accomplished without difficulty.                            The patient tolerated the procedure well. Scope In: Scope Out: Findings:                 The examined esophagus was normal.                           The gastroesophageal flap valve was visualized                            endoscopically and classified as Hill Grade I                            (prominent fold, tight to endoscope).                           The entire examined stomach was normal.                           The examined duodenum was normal.                           The cardia and gastric fundus were normal on                            retroflexion. Complications:            No immediate complications. Estimated Blood Loss:     Estimated blood loss: none. Impression:               - Normal esophagus.                           - Gastroesophageal flap valve classified as Hill                            Grade I (prominent fold, tight to endoscope).                           - Normal stomach.                           -  Normal examined duodenum.                           - No specimens collected. Recommendation:           - Patient has a contact number available for                            emergencies. The signs and symptoms of potential                            delayed complications were discussed with the                            patient. Return to normal activities tomorrow.                            Written discharge instructions were provided to the                            patient.                           - Resume previous diet.                           - Continue present medications.                           - See the other procedure note for documentation of                            additional recommendations. Iva Boop, MD 11/21/2020 10:48:34 AM This report has been signed electronically.

## 2020-11-21 NOTE — Op Note (Signed)
[facility] Patient Name: Alexa Rios Procedure Date: 11/21/2020 10:09 AM MRN: [id number] Endoscopist: Iva Booparl E Quintavis [employer]ands , MD Age: 75 Referring MD:  Date of Birth: [birth year] Gender: Female Account #: [id number] Procedure:                Colonoscopy Indications:              Diverticulitis, Follow-up of diverticulitis Medicines:                Propofol per Anesthesia, Monitored Anesthesia Care Procedure:                Pre-Anesthesia Assessment:                           - Prior to the procedure, a History and Physical                            was performed, and patient medications and                            allergies were reviewed. The patient's tolerance of                            previous anesthesia was also reviewed. The risks                            and benefits of the procedure and the sedation                            options and risks were discussed with the patient.                            All questions were answered, and informed consent                            was obtained. Prior Anticoagulants: The patient has                            taken no previous anticoagulant or antiplatelet                            agents. ASA Grade Assessment: II - A patient with                            mild systemic disease. After reviewing the risks                            and benefits, the patient was deemed in                            satisfactory condition to undergo the procedure.                           - Prior to the procedure, a History and Physical  was performed, and patient medications and                            allergies were reviewed. The patient's tolerance of                            previous anesthesia was also reviewed. The risks                            and benefits of the procedure and the sedation                            options and risks were discussed with the patient.                             All questions were answered, and informed consent                            was obtained. Prior Anticoagulants: The patient has                            taken no previous anticoagulant or antiplatelet                            agents. ASA Grade Assessment: II - A patient with                            mild systemic disease. After reviewing the risks                            and benefits, the patient was deemed in                            satisfactory condition to undergo the procedure.                           After obtaining informed consent, the colonoscope                            was passed under direct vision. Throughout the                            procedure, the patient's blood pressure, pulse, and                            oxygen saturations were monitored continuously. The                            Olympus PFC-[id number] (#[id number]) Colonoscope was                            introduced through the anus and advanced to the the  cecum, identified by appendiceal orifice and                            ileocecal valve. The colonoscopy was performed with                            difficulty due to restricted mobility of the colon                            and a tortuous colon. Successful completion of the                            procedure was aided by straightening and shortening                            the scope to obtain bowel loop reduction and                            applying abdominal pressure. The patient tolerated                            the procedure well. The quality of the bowel                            preparation was good. The ileocecal valve,                            appendiceal orifice, and rectum were photographed.                            The bowel preparation used was Miralax via split                            dose instruction. Scope In: 10:25:55 AM Scope Out: 10:40:29 AM Scope Withdrawal Time: 0 hours 7 minutes  31 seconds  Total Procedure Duration: 0 hours 14 minutes 34 seconds  Findings:                 The perianal and digital rectal examinations were                            normal.                           Many small and large-mouthed diverticula were found                            in the sigmoid colon. There was narrowing of the                            colon in association with the diverticular opening.                           The exam was otherwise without abnormality on  direct and retroflexion views. Complications:            No immediate complications. Estimated Blood Loss:     Estimated blood loss: none. Impression:               - Severe diverticulosis in the sigmoid colon. There                            was narrowing of the colon in association with the                            diverticular opening.                           - The examination was otherwise normal on direct                            and retroflexion views except for some                            hypertrophied anal papillae.                           - No specimens collected. Recommendation:           - Patient has a contact number available for                            emergencies. The signs and symptoms of potential                            delayed complications were discussed with the                            patient. Return to normal activities tomorrow.                            Written discharge instructions were provided to the                            patient.                           - Resume previous diet.                           - Continue present medications.                           - No repeat colonoscopy due to age and the absence                            of colonic polyps.                           - Return to my office PRN. Iva Boop, MD 11/21/2020 10:53:16 AM This report has been signed electronically.

## 2020-11-21 NOTE — Progress Notes (Signed)
Report given to PACU, vss 

## 2020-11-21 NOTE — Progress Notes (Signed)
Medical history reviewed with no changes noted. VS assessed by S.H, RN 

## 2020-11-21 NOTE — Progress Notes (Signed)
1016 Robinul 0.1 mg IV given due large amount of secretions upon assessment.  MD made aware, vss 

## 2020-11-21 NOTE — Patient Instructions (Addendum)
The esophagus, stomach and upper intestine are all normal.  You do have severe sigmoid diverticulosis in the colon - but all else ok there.  I am glad you have been feeling better as far as the gut is concerned and I hope that continues.  I hope you get good news from the cardiologist.  I appreciate the opportunity to care for you. Iva Boop, MD, [facility]  Information on diverticulosis given to you today.  Resume previous diet and medications.  YOU HAD AN ENDOSCOPIC PROCEDURE TODAY AT THE [facility]:   Refer to the procedure report that was given to you for any specific questions about what was found during the examination.  If the procedure report does not answer your questions, please call your gastroenterologist to clarify.  If you requested that your care partner not be given the details of your procedure findings, then the procedure report has been included in a sealed envelope for you to review at your convenience later.  YOU SHOULD EXPECT: Some feelings of bloating in the abdomen. Passage of more gas than usual.  Walking can help get rid of the air that was put into your GI tract during the procedure and reduce the bloating. If you had a lower endoscopy (such as a colonoscopy or flexible sigmoidoscopy) you may notice spotting of blood in your stool or on the toilet paper. If you underwent a bowel prep for your procedure, you may not have a normal bowel movement for a few days.  Please Note:  You might notice some irritation and congestion in your nose or some drainage.  This is from the oxygen used during your procedure.  There is no need for concern and it should clear up in a day or so.  SYMPTOMS TO REPORT IMMEDIATELY:   Following lower endoscopy (colonoscopy or flexible sigmoidoscopy):  Excessive amounts of blood in the stool  Significant tenderness or worsening of abdominal pains  Swelling of the abdomen that is new, acute  Fever of 100F or  higher   Following upper endoscopy (EGD)  Vomiting of blood or coffee ground material  New chest pain or pain under the shoulder blades  Painful or persistently difficult swallowing  New shortness of breath  Fever of 100F or higher  Black, tarry-looking stools  For urgent or emergent issues, a gastroenterologist can be reached at any hour by calling [phone]. Do not use MyChart messaging for urgent concerns.    DIET:  We do recommend a small meal at first, but then you may proceed to your regular diet.  Drink plenty of fluids but you should avoid alcoholic beverages for 24 hours.  ACTIVITY:  You should plan to take it easy for the rest of today and you should NOT DRIVE or use heavy machinery until tomorrow (because of the sedation medicines used during the test).    FOLLOW UP: Our staff will call the number listed on your records 48-72 hours following your procedure to check on you and address any questions or concerns that you may have regarding the information given to you following your procedure. If we do not reach you, we will leave a message.  We will attempt to reach you two times.  During this call, we will ask if you have developed any symptoms of COVID 19. If you develop any symptoms (ie: fever, flu-like symptoms, shortness of breath, cough etc.) before then, please call [phone].  If you test positive for Covid 19 in the 2  weeks post procedure, please call and report this information to [place].    If any biopsies were taken you will be contacted by phone or by letter within the next 1-3 weeks.  Please call us at [phone] if you have not heard about the biopsies in 3 weeks.    SIGNATURES/CONFIDENTIALITY: You and/or your care partner have signed paperwork which will be entered into your electronic medical record.  These signatures attest to the fact that that the information above on your After Visit Summary has been reviewed and is understood.  Full responsibility of  the confidentiality of this discharge information lies with you and/or your care-partner.

## 2020-11-25 ENCOUNTER — Telehealth: Payer: Self-pay

## 2020-11-25 NOTE — Telephone Encounter (Signed)
  Follow up Call-  Call back number 11/21/2020 05/08/2020  Post procedure Call Back phone  # [phone]  Permission to leave phone message Yes Yes  Some recent data might be hidden     Patient questions:  Do you have a fever, pain , or abdominal swelling? No. Pain Score  0 *  Have you tolerated food without any problems? Yes.    Have you been able to return to your normal activities? Yes.    Do you have any questions about your discharge instructions: Diet   No. Medications  No. Follow up visit  No.  Do you have questions or concerns about your Care? No.  Actions: * If pain score is 4 or above: No action needed, pain <4. 1. Have you developed a fever since your procedure? no  2.   Have you had an respiratory symptoms (SOB or cough) since your procedure? no  3.   Have you tested positive for COVID 19 since your procedure no  4.   Have you had any family members/close contacts diagnosed with the COVID 19 since your procedure?  no   If yes to any of these questions please route to Laverna Peace, RN and Karlton Lemon, RN

## 2020-12-05 ENCOUNTER — Other Ambulatory Visit: Payer: Self-pay | Admitting: Internal Medicine

## 2020-12-05 MED ORDER — AMOXICILLIN-POT CLAVULANATE 875-125 MG PO TABS: 1.0000 | ORAL_TABLET | Freq: Two times a day (BID) | ORAL | 0 refills | Status: DC

## 2020-12-26 ENCOUNTER — Other Ambulatory Visit: Payer: Self-pay

## 2020-12-26 ENCOUNTER — Emergency Department (HOSPITAL_BASED_OUTPATIENT_CLINIC_OR_DEPARTMENT_OTHER)
Admission: EM | Admit: 2020-12-26 | Discharge: 2020-12-26 | Disposition: A | Discharge status: Home / Self Care | Payer: Medicare PPO | Attending: Emergency Medicine | Admitting: Emergency Medicine

## 2020-12-26 ENCOUNTER — Encounter (HOSPITAL_BASED_OUTPATIENT_CLINIC_OR_DEPARTMENT_OTHER): Payer: Self-pay

## 2020-12-26 DIAGNOSIS — E039 Hypothyroidism, unspecified: Secondary | ICD-10-CM | POA: Diagnosis not present

## 2020-12-26 DIAGNOSIS — Z79899 Other long term (current) drug therapy: Secondary | ICD-10-CM | POA: Insufficient documentation

## 2020-12-26 DIAGNOSIS — G43809 Other migraine, not intractable, without status migrainosus: Secondary | ICD-10-CM

## 2020-12-26 DIAGNOSIS — G43909 Migraine, unspecified, not intractable, without status migrainosus: Secondary | ICD-10-CM | POA: Diagnosis present

## 2020-12-26 MED ORDER — DIPHENHYDRAMINE HCL 50 MG/ML IJ SOLN
25.0000 mg | Freq: Once | INTRAMUSCULAR | Status: AC
  Administered 2020-12-26: 25 mg via INTRAVENOUS
  Filled 2020-12-26: qty 1

## 2020-12-26 MED ORDER — DEXAMETHASONE SODIUM PHOSPHATE 10 MG/ML IJ SOLN
10.0000 mg | Freq: Once | INTRAMUSCULAR | Status: AC
  Administered 2020-12-26: 10 mg via INTRAVENOUS
  Filled 2020-12-26: qty 1

## 2020-12-26 MED ORDER — METOCLOPRAMIDE HCL 5 MG/ML IJ SOLN
10.0000 mg | Freq: Once | INTRAMUSCULAR | Status: AC
  Administered 2020-12-26: 10 mg via INTRAVENOUS
  Filled 2020-12-26: qty 2

## 2020-12-26 MED ORDER — KETOROLAC TROMETHAMINE 30 MG/ML IJ SOLN
30.0000 mg | Freq: Once | INTRAMUSCULAR | Status: AC
  Administered 2020-12-26: 30 mg via INTRAVENOUS
  Filled 2020-12-26: qty 1

## 2020-12-26 MED ORDER — SODIUM CHLORIDE 0.9 % IV BOLUS
1000.0000 mL | Freq: Once | INTRAVENOUS | Status: AC
  Administered 2020-12-26: 1000 mL via INTRAVENOUS

## 2020-12-26 NOTE — Discharge Instructions (Addendum)
Continue medications as previously prescribed.  Return to the emergency department if symptoms significantly worsen or change. 

## 2020-12-26 NOTE — ED Triage Notes (Signed)
Patient arrives from home with c/o of migraine  x 24 hours. Patient has had episodes of n/d. Patient tried prescribed medications with no relief.

## 2020-12-26 NOTE — ED Provider Notes (Signed)
[facility] EMERGENCY DEPT Provider Note   CSN: [id number] Arrival date & time: 12/26/20  0134     History Chief Complaint  Patient presents with   Migraine    Alexa Rios is a 75 y.o. female.  Patient is a 75 year old female with past medical history of hypothyroidism, GERD, diverticulitis, and migraines/trigeminal neuralgia/occipital neuralgia.  Patient presenting today for evaluation of headache.  She describes a constant, throbbing pain to the left side of her head and back of her neck.  This has been worsening over the past 24 hours.  It began in the absence of any injury or trauma.  This feels similar to prior migraines.  She denies any fevers, chills, or visual disturbances.  She has tried taking gabapentin and Ubrelvy with little relief.  The history is provided by the patient.  Migraine This is a recurrent problem. The current episode started yesterday. The problem occurs constantly. The problem has been gradually worsening. Associated symptoms include headaches. Nothing aggravates the symptoms. Nothing relieves the symptoms. She has tried nothing for the symptoms.      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 03/15/2020   Occipital neuralgia of right side 03/15/2020   Diverticulosis 03/15/2020   Migraine headache with aura 03/15/2020   Hypothyroid 03/15/2020   Environmental and seasonal  allergies 03/15/2020   Heartburn 03/15/2020    Past Surgical History:  Procedure Laterality Date   BREAST BIOPSY Left 1976   benign per patient   BREAST CYST ASPIRATION Right    30 + yrs ago   CHOLECYSTECTOMY  2015   COLONOSCOPY     KIDNEY CYST REMOVAL     REDUCTION MAMMAPLASTY Bilateral 2005   TONSILLECTOMY AND ADENOIDECTOMY  1951   VAGINAL HYSTERECTOMY  2000   benign path     OB History   No obstetric history on file.     Family History  Problem Relation Age of Onset   Rheum arthritis Mother    Hyperlipidemia Mother    Stroke Mother    Lymphoma Father    Diabetes Father    Early death Father    Hyperlipidemia Father    Cancer Brother        ureteral   Diabetes Brother    Hyperlipidemia Brother    Arthritis Sister    Hyperlipidemia Sister    Heart disease Maternal Grandmother    High Cholesterol Maternal Grandmother    Heart disease Paternal Grandmother    High Cholesterol Paternal Grandmother    Prostate cancer Paternal Grandfather    Stroke Paternal Grandfather    Colon cancer Neg Hx    Pancreatic cancer Neg Hx    Esophageal cancer Neg Hx     Social History   Tobacco Use   Smoking status: Never   Smokeless tobacco: Never  Vaping Use  Vaping Use: Never used  Substance Use Topics   Alcohol use: Not Currently   Drug use: Never    Home Medications Prior to Admission medications   Medication Sig Start Date End Date Taking? Authorizing Provider  amoxicillin-clavulanate (AUGMENTIN) 875-125 MG tablet Take 1 tablet by mouth 2 (two) times daily. 12/05/20   Iva Boop, MD  BIOTIN 5000 PO Take 1 tablet by mouth in the morning and at bedtime.    [provider]  CALCIUM PO Take 1,200 mg by mouth daily.    [provider]  dicyclomine (BENTYL) 10 MG capsule Take 1 capsule (10 mg total) by mouth every 6 (six) hours as needed for spasms. Patient not taking: Reported on 11/21/2020 09/01/20   Iva Boop, MD  esomeprazole (NEXIUM) 20 MG  capsule Take 20 mg by mouth daily at 12 noon. Patient not taking: Reported on 11/21/2020    [provider]  Estradiol 1 MG/GM GEL Apply 1mg  twice a week as needed Patient taking differently: Apply 1 application topically once a week. Apply twice weekly on Wednesday and Sundays 04/09/20   04/11/20, MD  EVENING PRIMROSE OIL PO Take 500 mg by mouth daily.    [provider]  Glucosamine-Chondroitin (COSAMIN DS PO) Take 1 tablet by mouth daily.    [provider]  levothyroxine (SYNTHROID) 25 MCG tablet Take 1 tablet (25 mcg total) by mouth daily before breakfast. 08/20/20   10/18/20, MD  Multiple Vitamin (MULTIVITAMIN ADULT PO) Take by mouth.    [provider]  ondansetron (ZOFRAN) 4 MG tablet Take 1 tablet (4 mg total) by mouth every 8 (eight) hours as needed for nausea or vomiting. Patient not taking: No sig reported 08/27/20   10/25/20, MD  polyethylene glycol (MIRALAX / GLYCOLAX) 17 g packet Take 17 g by mouth 3 (three) times daily.    [provider]  Potassium 99 MG TABS Take 1 tablet by mouth daily.    [provider]  saccharomyces boulardii (FLORASTOR) 250 MG capsule Take 250 mg by mouth 2 (two) times daily.    [provider]  Ubrogepant (UBRELVY) 50 MG TABS Take 50 mg by mouth once as needed for up to 1 dose. Patient not taking: No sig reported 03/14/20   03/16/20, MD    Allergies    Sulfamethoxazole-trimethoprim and Sumatriptan  Review of Systems   Review of Systems  Neurological:  Positive for headaches.  All other systems reviewed and are negative.  Physical Exam Updated Vital Signs BP (!) 151/89 (BP Location: Right Arm)   Pulse 73   Temp 98.2 F (36.8 C) (Oral)   Resp 18   Ht 5\' 5"  (1.651 m)   Wt 62.1 kg   SpO2 100%   BMI 22.78 kg/m   Physical Exam Vitals and nursing note reviewed.  Constitutional:      General: She is not in acute distress.    Appearance: She is  well-developed. She is not diaphoretic.  HENT:     Head: Normocephalic and atraumatic.  Eyes:     Extraocular Movements: Extraocular movements intact.     Pupils: Pupils are equal, round, and reactive to light.  Cardiovascular:     Rate and Rhythm: Normal rate and regular rhythm.     Heart sounds: No murmur heard.   No friction rub. No gallop.  Pulmonary:     Effort: Pulmonary effort is normal. No respiratory distress.  Breath sounds: Normal breath sounds. No wheezing.  Abdominal:     General: Bowel sounds are normal. There is no distension.     Palpations: Abdomen is soft.     Tenderness: There is no abdominal tenderness.  Musculoskeletal:        General: Normal range of motion.     Cervical back: Normal range of motion and neck supple.  Skin:    General: Skin is warm and dry.  Neurological:     General: No focal deficit present.     Mental Status: She is alert and oriented to person, place, and time.     Cranial Nerves: No cranial nerve deficit.     Sensory: No sensory deficit.     Motor: No weakness.     Coordination: Coordination normal.     Gait: Gait normal.    ED Results / Procedures / Treatments   Labs (all labs ordered are listed, but only abnormal results are displayed) Labs Reviewed - No data to display  EKG None  Radiology No results found.  Procedures Procedures   Medications Ordered in ED Medications  sodium chloride 0.9 % bolus 1,000 mL (has no administration in time range)  ketorolac (TORADOL) 30 MG/ML injection 30 mg (has no administration in time range)  dexamethasone (DECADRON) injection 10 mg (has no administration in time range)  metoCLOPramide (REGLAN) injection 10 mg (has no administration in time range)  diphenhydrAMINE (BENADRYL) injection 25 mg (has no administration in time range)    ED Course  I have reviewed the triage vital signs and the nursing notes.  Pertinent labs & imaging results that were available during my care of the  patient were reviewed by me and considered in my medical decision making (see chart for details).    MDM Rules/Calculators/A&P  Symptoms have significantly improved after receiving a migraine cocktail and fluids.  Patient is neurologically intact with stable vital signs.  I see no indication for imaging studies.  Patient to be discharged with return as needed.  Final Clinical Impression(s) / ED Diagnoses Final diagnoses:  None    Rx / DC Orders ED Discharge Orders     None        Geoffery Lyons, MD 12/26/20 0345

## 2021-02-05 ENCOUNTER — Telehealth: Payer: Self-pay | Admitting: Internal Medicine

## 2021-02-05 NOTE — Telephone Encounter (Signed)
Inbound call from patient requesting a call from a nurse please.  Has been having stomach issues for the past 3 weeks.

## 2021-02-05 NOTE — Telephone Encounter (Signed)
Patient was recently on a round of prednisone and has had stomach burning since.  She is also reporting that she is week and feels shaky.  She is given an appointment for Dr. Leone Payor next available on 02/16/21 at 9:10.  She states she will see if she can see her PCP, an urgent Care of ED until OV with Dr. Leone Payor

## 2021-02-06 ENCOUNTER — Emergency Department (HOSPITAL_BASED_OUTPATIENT_CLINIC_OR_DEPARTMENT_OTHER)
Admission: EM | Admit: 2021-02-06 | Discharge: 2021-02-06 | Disposition: A | Discharge status: Home / Self Care | Payer: Medicare PPO | Attending: Emergency Medicine | Admitting: Emergency Medicine

## 2021-02-06 ENCOUNTER — Other Ambulatory Visit: Payer: Self-pay

## 2021-02-06 ENCOUNTER — Encounter (HOSPITAL_BASED_OUTPATIENT_CLINIC_OR_DEPARTMENT_OTHER): Payer: Self-pay

## 2021-02-06 ENCOUNTER — Emergency Department (HOSPITAL_BASED_OUTPATIENT_CLINIC_OR_DEPARTMENT_OTHER): Payer: Medicare PPO

## 2021-02-06 DIAGNOSIS — Z9049 Acquired absence of other specified parts of digestive tract: Secondary | ICD-10-CM | POA: Insufficient documentation

## 2021-02-06 DIAGNOSIS — E039 Hypothyroidism, unspecified: Secondary | ICD-10-CM | POA: Diagnosis not present

## 2021-02-06 DIAGNOSIS — Z79899 Other long term (current) drug therapy: Secondary | ICD-10-CM | POA: Insufficient documentation

## 2021-02-06 DIAGNOSIS — R1031 Right lower quadrant pain: Secondary | ICD-10-CM | POA: Diagnosis not present

## 2021-02-06 DIAGNOSIS — K219 Gastro-esophageal reflux disease without esophagitis: Secondary | ICD-10-CM | POA: Diagnosis not present

## 2021-02-06 DIAGNOSIS — R109 Unspecified abdominal pain: Secondary | ICD-10-CM

## 2021-02-06 LAB — CBC WITH DIFFERENTIAL/PLATELET
Abs Immature Granulocytes: 0.01 10*3/uL (ref 0.00–0.07)
Basophils Absolute: 0 10*3/uL (ref 0.0–0.1)
Basophils Relative: 1 %
Eosinophils Absolute: 0.1 10*3/uL (ref 0.0–0.5)
Eosinophils Relative: 2 %
HCT: 40.1 % (ref 36.0–46.0)
Hemoglobin: 13.5 g/dL (ref 12.0–15.0)
Immature Granulocytes: 0 %
Lymphocytes Relative: 37 %
Lymphs Abs: 2.1 10*3/uL (ref 0.7–4.0)
MCH: 32.1 pg (ref 26.0–34.0)
MCHC: 33.7 g/dL (ref 30.0–36.0)
MCV: 95.2 fL (ref 80.0–100.0)
Monocytes Absolute: 0.5 10*3/uL (ref 0.1–1.0)
Monocytes Relative: 9 %
Neutro Abs: 3 10*3/uL (ref 1.7–7.7)
Neutrophils Relative %: 51 %
Platelets: 253 10*3/uL (ref 150–400)
RBC: 4.21 MIL/uL (ref 3.87–5.11)
RDW: 12.5 % (ref 11.5–15.5)
WBC: 5.8 10*3/uL (ref 4.0–10.5)
nRBC: 0 % (ref 0.0–0.2)

## 2021-02-06 LAB — URINALYSIS, ROUTINE W REFLEX MICROSCOPIC
Bilirubin Urine: NEGATIVE
Glucose, UA: NEGATIVE mg/dL
Ketones, ur: NEGATIVE mg/dL
Leukocytes,Ua: NEGATIVE
Nitrite: NEGATIVE
Protein, ur: NEGATIVE mg/dL
Specific Gravity, Urine: <1.005 — ABNORMAL LOW (ref 1.005–1.030)
pH: 6.5 (ref 5.0–8.0)

## 2021-02-06 LAB — COMPREHENSIVE METABOLIC PANEL
ALT: 20 U/L (ref 0–44)
AST: 25 U/L (ref 15–41)
Albumin: 4.6 g/dL (ref 3.5–5.0)
Alkaline Phosphatase: 110 U/L (ref 38–126)
Anion gap: 9 (ref 5–15)
BUN: 10 mg/dL (ref 8–23)
CO2: 29 mmol/L (ref 22–32)
Calcium: 9.2 mg/dL (ref 8.9–10.3)
Chloride: 98 mmol/L (ref 98–111)
Creatinine, Ser: 0.6 mg/dL (ref 0.44–1.00)
GFR, Estimated: >60 mL/min (ref >60)
Glucose, Bld: 82 mg/dL (ref 70–99)
Potassium: 3.4 mmol/L — ABNORMAL LOW (ref 3.5–5.1)
Sodium: 136 mmol/L (ref 135–145)
Total Bilirubin: 0.3 mg/dL (ref 0.3–1.2)
Total Protein: 7.2 g/dL (ref 6.5–8.1)

## 2021-02-06 LAB — LIPASE, BLOOD: Lipase: 31 U/L (ref 11–51)

## 2021-02-06 MED ORDER — SODIUM CHLORIDE 0.9 % IV BOLUS
1000.0000 mL | Freq: Once | INTRAVENOUS | Status: AC
  Administered 2021-02-06: 1000 mL via INTRAVENOUS

## 2021-02-06 MED ORDER — SODIUM CHLORIDE 0.9 % IV SOLN: INTRAVENOUS | Status: DC

## 2021-02-06 MED ORDER — PANTOPRAZOLE SODIUM 40 MG IV SOLR
40.0000 mg | Freq: Once | INTRAVENOUS | Status: AC
  Administered 2021-02-06: 40 mg via INTRAVENOUS
  Filled 2021-02-06: qty 40

## 2021-02-06 MED ORDER — ONDANSETRON HCL 4 MG/2ML IJ SOLN
4.0000 mg | Freq: Once | INTRAMUSCULAR | Status: AC
  Administered 2021-02-06: 4 mg via INTRAVENOUS
  Filled 2021-02-06: qty 2

## 2021-02-06 MED ORDER — IOHEXOL 350 MG/ML SOLN
75.0000 mL | Freq: Once | INTRAVENOUS | Status: AC | PRN
  Administered 2021-02-06: 75 mL via INTRAVENOUS

## 2021-02-06 MED ORDER — IOHEXOL 9 MG/ML PO SOLN
500.0000 mL | Freq: Once | ORAL | Status: AC
  Administered 2021-02-06: 500 mL via ORAL

## 2021-02-06 MED ORDER — SUCRALFATE 1 G PO TABS: 1.0000 g | ORAL_TABLET | Freq: Three times a day (TID) | ORAL | 0 refills | Status: DC

## 2021-02-06 NOTE — Discharge Instructions (Addendum)
Increase your Nexium to 40 mg daily.  You can also take the Carafate to help with your symptoms.  Follow-up with your GI doctor as planned

## 2021-02-06 NOTE — ED Provider Notes (Signed)
[facility]   CSN: [id number] Arrival date & time: 02/06/21  1636     History Chief Complaint  Patient presents with   Abdominal Pain    Alexa Rios is a 75 y.o. female.   Abdominal Pain  Patient presented to the ED for evaluation of abdominal pain.  Patient states she started having symptoms several weeks ago.  She has been having burning type discomfort in her upper abdomen going to her chest.  However now she started having pain down in her right groin lower abdomen.  The pain has been increasing in severity.  She has been nauseated but no vomiting.  Pain in her abdomen is dull and throbbing.  She has seen her doctor an they noted some blood in her urine so she was referred to urology.  She also has an appointment with GI but they are not until next month.  Patient came to the ED for further evaluation  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 03/15/2020   Occipital neuralgia of right side 03/15/2020   Diverticulosis 03/15/2020   Migraine headache with aura 03/15/2020   Hypothyroid 03/15/2020   Environmental and seasonal allergies 03/15/2020   Heartburn 03/15/2020    Past Surgical History:  Procedure Laterality Date   BREAST BIOPSY Left 1976   benign per patient   BREAST CYST ASPIRATION Right    30 + yrs ago   CHOLECYSTECTOMY  2015   COLONOSCOPY     KIDNEY CYST  REMOVAL     REDUCTION MAMMAPLASTY Bilateral 2005   TONSILLECTOMY AND ADENOIDECTOMY  1951   VAGINAL HYSTERECTOMY  2000   benign path     OB History   No obstetric history on file.     Family History  Problem Relation Age of Onset   Rheum arthritis Mother    Hyperlipidemia Mother    Stroke Mother    Lymphoma Father    Diabetes Father    Early death Father    Hyperlipidemia Father    Cancer Brother        ureteral   Diabetes Brother    Hyperlipidemia Brother    Arthritis Sister    Hyperlipidemia Sister    Heart disease Maternal Grandmother    High Cholesterol Maternal Grandmother    Heart disease Paternal Grandmother    High Cholesterol Paternal Grandmother    Prostate cancer Paternal Grandfather    Stroke Paternal Grandfather    Colon cancer Neg Hx    Pancreatic cancer Neg Hx    Esophageal cancer Neg Hx     Social History   Tobacco Use   Smoking status: Never   Smokeless tobacco: Never  Vaping Use   Vaping Use: Never used  Substance Use Topics   Alcohol use: Not Currently   Drug use: Never  Home Medications Prior to Admission medications   Medication Sig Start Date End Date Taking? Authorizing Provider  sucralfate (CARAFATE) 1 g tablet Take 1 tablet (1 g total) by mouth 4 (four) times daily -  with meals and at bedtime. 02/06/21  Yes Linwood Dibbles, MD  amoxicillin-clavulanate (AUGMENTIN) 875-125 MG tablet Take 1 tablet by mouth 2 (two) times daily. 12/05/20   Iva Boop, MD  BIOTIN 5000 PO Take 1 tablet by mouth in the morning and at bedtime.    [provider]  CALCIUM PO Take 1,200 mg by mouth daily.    [provider]  dicyclomine (BENTYL) 10 MG capsule Take 1 capsule (10 mg total) by mouth every 6 (six) hours as needed for spasms. Patient not taking: Reported on 11/21/2020 09/01/20   Iva Boop, MD  esomeprazole (NEXIUM) 20 MG capsule Take 20 mg by mouth daily at 12 noon. Patient not taking: Reported on 11/21/2020    [provider]  Estradiol 1 MG/GM GEL Apply 1mg  twice a week as needed Patient taking differently: Apply 1 application topically once a week. Apply twice weekly on Wednesday and Sundays 04/09/20   04/11/20, MD  EVENING PRIMROSE OIL PO Take 500 mg by mouth daily.    [provider]  Glucosamine-Chondroitin (COSAMIN DS PO) Take 1 tablet by mouth daily.    [provider]  levothyroxine (SYNTHROID) 25 MCG tablet Take 1 tablet (25 mcg total) by mouth daily before breakfast. 08/20/20   10/18/20, MD  Multiple Vitamin (MULTIVITAMIN ADULT PO) Take by mouth.    [provider]  ondansetron (ZOFRAN) 4 MG tablet Take 1 tablet (4 mg total) by mouth every 8 (eight) hours as needed for nausea or vomiting. Patient not taking: No sig reported 08/27/20   10/25/20, MD  polyethylene glycol (MIRALAX / GLYCOLAX) 17 g packet Take 17 g by mouth 3 (three) times daily.    [provider]  Potassium 99 MG TABS Take 1 tablet by mouth daily.    [provider]  saccharomyces boulardii (FLORASTOR) 250 MG capsule Take 250 mg by mouth 2 (two) times daily.    [provider]  Ubrogepant (UBRELVY) 50 MG TABS Take 50 mg by mouth once as needed for up to 1 dose. Patient not taking: No sig reported 03/14/20   03/16/20, MD    Allergies    Sulfamethoxazole-trimethoprim and Sumatriptan  Review of Systems   Review of Systems  Gastrointestinal:  Positive for abdominal pain.  All other systems reviewed and are negative.  Physical Exam Updated Vital Signs BP (!) 92/58 (BP Location: Left Arm)   Pulse 71   Temp 98 F (36.7 C) (Oral)   Resp 16   Ht 1.651 m (5\' 5" )   Wt 61.2 kg   SpO2 99%   BMI 22.47 kg/m   Physical Exam Vitals and nursing note reviewed.  Constitutional:      General: She is not in acute distress.    Appearance: She is well-developed.  HENT:     Head: Normocephalic and atraumatic.     Right Ear: External ear normal.      Left Ear: External ear normal.  Eyes:     General: No scleral icterus.       Right eye: No discharge.        Left eye: No discharge.     Conjunctiva/sclera: Conjunctivae normal.  Neck:     Trachea: No tracheal deviation.  Cardiovascular:     Rate and Rhythm: Normal rate and regular rhythm.  Pulmonary:     Effort: Pulmonary effort is normal. No respiratory distress.     Breath sounds: Normal breath sounds. No stridor. No wheezing or rales.  Abdominal:     General: Bowel sounds are normal. There is no distension.     Palpations: Abdomen is soft.     Tenderness: There is abdominal tenderness in the right lower quadrant. There is no guarding or rebound.  Musculoskeletal:        General: No tenderness or deformity.     Cervical back: Neck supple.     Right lower leg: No edema.     Left lower leg: No edema.  Skin:    General: Skin is warm and dry.     Findings: No rash.  Neurological:     General: No focal deficit present.     Mental Status: She is alert.     Cranial Nerves: No cranial nerve deficit (no facial droop, extraocular movements intact, no slurred speech).     Sensory: No sensory deficit.     Motor: No abnormal muscle tone or seizure activity.     Coordination: Coordination normal.  Psychiatric:        Mood and Affect: Mood normal.    ED Results / Procedures / Treatments   Labs (all labs ordered are listed, but only abnormal results are displayed) Labs Reviewed  COMPREHENSIVE METABOLIC PANEL - Abnormal; Notable for the following components:      Result Value   Potassium 3.4 (*)    All other components within normal limits  URINALYSIS, ROUTINE W REFLEX MICROSCOPIC - Abnormal; Notable for the following components:   Color, Urine COLORLESS (*)    Specific Gravity, Urine <1.005 (*)    Hgb urine dipstick SMALL (*)    All other components within normal limits  LIPASE, BLOOD  CBC WITH DIFFERENTIAL/PLATELET    EKG None  Radiology CT ABDOMEN PELVIS W  CONTRAST  Result Date: 02/06/2021 CLINICAL DATA:  Right-sided abdominal pain x3 weeks. EXAM: CT ABDOMEN AND PELVIS WITH CONTRAST TECHNIQUE: Multidetector CT imaging of the abdomen and pelvis was performed using the standard protocol following bolus administration of intravenous contrast. CONTRAST:  54mL OMNIPAQUE IOHEXOL 350 MG/ML SOLN COMPARISON:  September 10, 2018 FINDINGS: Lower chest: No acute abnormality. Hepatobiliary: A stable 5 mm focus of parenchymal low attenuation is seen within the anterolateral aspect of the right lobe of the liver. This is too small to characterize by CT examination. Status post cholecystectomy. No biliary dilatation. Pancreas: Unremarkable. No pancreatic ductal dilatation or surrounding inflammatory changes. Spleen: Normal in size without focal abnormality. Adrenals/Urinary Tract: Adrenal glands are unremarkable. Kidneys are normal in size. A large area of cortical scarring is seen along the posterior aspect of the mid right kidney. Very mild renal cortical scarring is seen on the left. Bilateral prominent extrarenal pelvis are seen without evidence of renal calculi. The urinary bladder is markedly distended and otherwise unremarkable. Stomach/Bowel: Stomach is within normal limits. Appendix appears normal. No evidence of bowel wall thickening, distention, or inflammatory changes. Noninflamed diverticula are seen throughout the sigmoid colon. Vascular/Lymphatic: Aortic atherosclerosis. No enlarged abdominal or pelvic lymph nodes. Reproductive: Status post hysterectomy. No adnexal masses. Other: No abdominal wall hernia or abnormality. No abdominopelvic ascites. Musculoskeletal: Moderate to marked severity degenerative changes seen at the level of L5-S1. IMPRESSION: 1. Sigmoid diverticulosis. 2. Evidence of prior cholecystectomy. Electronically Signed   By: Aram Candela  M.D.   On: 02/06/2021 22:15    Procedures Procedures   Medications Ordered in ED Medications  sodium  chloride 0.9 % bolus 1,000 mL (1,000 mLs Intravenous New Bag/Given 02/06/21 2038)    And  0.9 %  sodium chloride infusion ( Intravenous New Bag/Given 02/06/21 2200)  ondansetron (ZOFRAN) injection 4 mg (4 mg Intravenous Given 02/06/21 2039)  pantoprazole (PROTONIX) injection 40 mg (40 mg Intravenous Given 02/06/21 2039)  iohexol (OMNIPAQUE) 9 MG/ML oral solution 500 mL (500 mLs Oral Contrast Given 02/06/21 2104)  iohexol (OMNIPAQUE) 350 MG/ML injection 75 mL (75 mLs Intravenous Contrast Given 02/06/21 2145)    ED Course  I have reviewed the triage vital signs and the nursing notes.  Pertinent labs & imaging results that were available during my care of the patient were reviewed by me and considered in my medical decision making (see chart for details).  Clinical Course as of 02/06/21 2236  Fri Feb 06, 2021  2053 CBC is normal.  Metabolic panel is unremarkable.  Urinalysis shows small hemoglobin but patient only has 0-5 RBCs.  Lipase is normal. [JK]  2219 CT scan without acute findings [JK]    Clinical Course User Index [JK] Linwood Dibbles, MD   MDM Rules/Calculators/A&P                           Patient presented to the ED for evaluation of abdominal pain.  Symptoms concerning for the possibility of appendicitis, diverticulitis.  Patient also having some GERD symptoms but that should not account for her lower abdominal pain.  Laboratory tests were unremarkable.  Urinalysis without signs of urine infection.  CT scan was performed considering her persistent symptoms and history of abdominal abscess.  CT scan fortunately does not show any acute abnormalities.  Patient is taking Nexium 20 mg daily.  We will have her increase that to 40 and try adding Carafate.  She does have follow-up planned with her GI doctor. Final Clinical Impression(s) / ED Diagnoses Final diagnoses:  Abdominal pain, unspecified abdominal location    Rx / DC Orders ED Discharge Orders          Ordered    sucralfate  (CARAFATE) 1 g tablet  3 times daily with meals & bedtime        02/06/21 2234             Linwood Dibbles, MD 02/06/21 2237

## 2021-02-06 NOTE — ED Triage Notes (Signed)
"  Right sided abdominal pain x 3 weeks, have an appointment with my GI doctor in mid August, I break out in sweats when pain gets bad and my right groin seems a little swollen as well, dull and throbbing pain in abdomen"

## 2021-02-06 NOTE — ED Notes (Signed)
Patient transported to CT 

## 2021-02-10 ENCOUNTER — Other Ambulatory Visit: Payer: Self-pay | Admitting: Internal Medicine

## 2021-02-11 ENCOUNTER — Emergency Department (HOSPITAL_BASED_OUTPATIENT_CLINIC_OR_DEPARTMENT_OTHER): Payer: Medicare PPO | Admitting: Radiology

## 2021-02-11 ENCOUNTER — Ambulatory Visit: Payer: Self-pay | Admitting: *Deleted

## 2021-02-11 ENCOUNTER — Emergency Department (HOSPITAL_BASED_OUTPATIENT_CLINIC_OR_DEPARTMENT_OTHER)
Admission: EM | Admit: 2021-02-11 | Discharge: 2021-02-11 | Disposition: A | Discharge status: Home / Self Care | Payer: Medicare PPO | Attending: Emergency Medicine | Admitting: Emergency Medicine

## 2021-02-11 ENCOUNTER — Other Ambulatory Visit: Payer: Self-pay

## 2021-02-11 ENCOUNTER — Encounter (HOSPITAL_BASED_OUTPATIENT_CLINIC_OR_DEPARTMENT_OTHER): Payer: Self-pay | Admitting: Obstetrics and Gynecology

## 2021-02-11 DIAGNOSIS — R002 Palpitations: Secondary | ICD-10-CM | POA: Diagnosis present

## 2021-02-11 DIAGNOSIS — Z79899 Other long term (current) drug therapy: Secondary | ICD-10-CM | POA: Diagnosis not present

## 2021-02-11 DIAGNOSIS — E039 Hypothyroidism, unspecified: Secondary | ICD-10-CM | POA: Diagnosis not present

## 2021-02-11 LAB — BASIC METABOLIC PANEL
Anion gap: 9 (ref 5–15)
BUN: 10 mg/dL (ref 8–23)
CO2: 27 mmol/L (ref 22–32)
Calcium: 9.1 mg/dL (ref 8.9–10.3)
Chloride: 97 mmol/L — ABNORMAL LOW (ref 98–111)
Creatinine, Ser: 0.8 mg/dL (ref 0.44–1.00)
GFR, Estimated: >60 mL/min (ref >60)
Glucose, Bld: 103 mg/dL — ABNORMAL HIGH (ref 70–99)
Potassium: 3.6 mmol/L (ref 3.5–5.1)
Sodium: 133 mmol/L — ABNORMAL LOW (ref 135–145)

## 2021-02-11 LAB — CBC
HCT: 36 % (ref 36.0–46.0)
Hemoglobin: 12.5 g/dL (ref 12.0–15.0)
MCH: 32.7 pg (ref 26.0–34.0)
MCHC: 34.7 g/dL (ref 30.0–36.0)
MCV: 94.2 fL (ref 80.0–100.0)
Platelets: 244 10*3/uL (ref 150–400)
RBC: 3.82 MIL/uL — ABNORMAL LOW (ref 3.87–5.11)
RDW: 12.3 % (ref 11.5–15.5)
WBC: 5.6 10*3/uL (ref 4.0–10.5)
nRBC: 0 % (ref 0.0–0.2)

## 2021-02-11 LAB — TROPONIN I (HIGH SENSITIVITY)
Troponin I (High Sensitivity): 2 ng/L (ref <18)
Troponin I (High Sensitivity): 3 ng/L (ref <18)

## 2021-02-11 NOTE — Telephone Encounter (Signed)
Reason for Disposition  Age > 60 years (Exception: brief heartbeat symptoms that went away and now feels well)  Answer Assessment - Initial Assessment Questions 1. DESCRIPTION: "Please describe your heart rate or heartbeat that you are having" (e.g., fast/slow, regular/irregular, skipped or extra beats, "palpitations")     Palpitations, "Fast heart rate."  2. ONSET: "When did it start?" (Minutes, hours or days)      Saturday after taking new med night 3. DURATION: "How long does it last" (e.g., seconds, minutes, hours)     LAst night 30 minutes 4. PATTERN "Does it come and go, or has it been constant since it started?"  "Does it get worse with exertion?"   "Are you feeling it now?"     Comes and goes 5. TAP: "Using your hand, can you tap out what you are feeling on a chair or table in front of you, so that I can hear?" (Note: not all patients can do this)       no 6. HEART RATE: "Can you tell me your heart rate?" "How many beats in 15 seconds?"  (Note: not all patients can do this)       "No I can't" 7. RECURRENT SYMPTOM: "Have you ever had this before?" If Yes, ask: "When was the last time?" and "What happened that time?"      No 8. CAUSE: "What do you think is causing the palpitations?"     Med 9. CARDIAC HISTORY: "Do you have any history of heart disease?" (e.g., heart attack, angina, bypass surgery, angioplasty, arrhythmia)      Anemia 10. OTHER SYMPTOMS: "Do you have any other symptoms?" (e.g., dizziness, chest pain, sweating, difficulty breathing)      None  Protocols used: Heart Rate and Heartbeat Questions-A-AH

## 2021-02-11 NOTE — Discharge Instructions (Addendum)
Stop all the new medication and go back to the 20 mg of antacid.  If this episode happens again try to check her pulse to see how fast it is going.  Also you may need to see cardiology in the future and have a monitor placed if these episodes continue.  If it happens again you can try bearing down to see if that will make the symptoms go away.  However if they do not go away you are feeling chest pain or shortness of breath please return to the emergency room.

## 2021-02-11 NOTE — ED Triage Notes (Signed)
Patient reports to the ER for tachycardia. Patinet reports she has been having palpitations feeling. States after she was started on Sucralfate, that same night she developed tachycardia, unknown how high her HR was. Patient reports she could hear her heartbeat in her head. Patient states she was told to come to the ER when she called her PCP office.

## 2021-02-11 NOTE — Telephone Encounter (Signed)
Pt reports palpitations and tachycardia, onset Friday. States has occurred at HS, "Beating very fast, one episode of sweating with this." States "Beating fast now and I can hear it in my ears but cannot tell you the rate." Denies any CP. States called PCP, stated "Concerning." Reports has appt with cardiologist due to anemia August 16th.  Advised ED.States will follow disposition.

## 2021-02-12 NOTE — ED Provider Notes (Signed)
[facility] EMERGENCY DEPT Provider Note   CSN: [id number] Arrival date & time: 02/11/21  1755     History Chief Complaint  Patient presents with   Tachycardia    Gittel Mccamish is a 75 y.o. female.  Patient is a very pleasant 75 year old female with a history of Paget's disease, thyroid disease, prior diverticulitis with abscess who is presenting today with palpitations.  Patient reports that last week she was having abdominal discomfort and burning.  She was seen in the emergency department had a CT that showed no evidence of acute diverticular disease and she was diagnosed with GERD.  She was started on 40 mg of Nexium and Carafate.  After going home on Saturday evening she woke up in the [place] with palpitations that lasted approximately 20 minutes.  She felt like her heart was beating hard she can feel it in her ears and it was going fast.  The next day she felt her normal self and was fine on Sunday and Monday but then last night around 2 AM she woke up again with palpitations this time lasting approximately 30 minutes.  She was finally able to go back to sleep but today has occasionally felt like her heart was beating stronger and she was fatigued.  She feels that her stomach issues are improving and she has not had any chest pain, shortness of breath, fever, cough, diarrhea or vomiting.  She spoke with her PCPs office and they called her twice and were concerned and felt she needed to come and be evaluated.  She reports that this time she feels her normal self.  She has not felt any palpitations while being here.  During these events she has not checked to see what her pulse rate is.  She has had a prior history of palpitations while she lived in [place] years ago and wore a monitor and was told she had a tachycardia but is not sure if it was SVT.  sHe was concerned she could be having a reaction to the medication.  The history is provided by the patient.       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 03/15/2020   Occipital neuralgia of right side 03/15/2020   Diverticulosis 03/15/2020   Migraine headache with aura 03/15/2020   Hypothyroid 03/15/2020   Environmental and seasonal allergies 03/15/2020   Heartburn 03/15/2020    Past Surgical History:  Procedure Laterality Date   BREAST BIOPSY Left 1976   benign per patient   BREAST CYST ASPIRATION Right    30 + yrs ago   CHOLECYSTECTOMY  2015   COLONOSCOPY     KIDNEY CYST REMOVAL     REDUCTION MAMMAPLASTY Bilateral 2005   TONSILLECTOMY AND ADENOIDECTOMY  1951   VAGINAL HYSTERECTOMY  2000   benign path     OB  History     Gravida      Para      Term      Preterm      AB      Living  1      SAB      IAB      Ectopic      Multiple      Live Births              Family History  Problem Relation Age of Onset   Rheum arthritis Mother    Hyperlipidemia Mother    Stroke Mother    Lymphoma Father    Diabetes Father    Early death Father    Hyperlipidemia Father    Cancer Brother        ureteral   Diabetes Brother    Hyperlipidemia Brother    Arthritis Sister    Hyperlipidemia Sister    Heart disease Maternal Grandmother    High Cholesterol Maternal Grandmother    Heart disease Paternal Grandmother    High Cholesterol Paternal Grandmother    Prostate cancer Paternal Grandfather    Stroke Paternal  Grandfather    Colon cancer Neg Hx    Pancreatic cancer Neg Hx    Esophageal cancer Neg Hx     Social History   Tobacco Use   Smoking status: Never   Smokeless tobacco: Never  Vaping Use   Vaping Use: Never used  Substance Use Topics   Alcohol use: Not Currently   Drug use: Never    Home Medications Prior to Admission medications   Medication Sig Start Date End Date Taking? Authorizing Provider  amoxicillin-clavulanate (AUGMENTIN) 875-125 MG tablet Take 1 tablet by mouth 2 (two) times daily. 12/05/20   Iva Boop, MD  BIOTIN 5000 PO Take 1 tablet by mouth in the morning and at bedtime.    [provider]  CALCIUM PO Take 1,200 mg by mouth daily.    [provider]  dicyclomine (BENTYL) 10 MG capsule Take 1 capsule (10 mg total) by mouth every 6 (six) hours as needed for spasms. Patient not taking: Reported on 11/21/2020 09/01/20   Iva Boop, MD  esomeprazole (NEXIUM) 20 MG capsule Take 20 mg by mouth daily at 12 noon. Patient not taking: Reported on 11/21/2020    [provider]  Estradiol 1 MG/GM GEL Apply 1mg  twice a week as needed Patient taking differently: Apply 1 application topically once a week. Apply twice weekly on Wednesday and Sundays 04/09/20   04/11/20, MD  EVENING PRIMROSE OIL PO Take 500 mg by mouth daily.    [provider]  Glucosamine-Chondroitin (COSAMIN DS PO) Take 1 tablet by mouth daily.    [provider]  levothyroxine (SYNTHROID) 25 MCG tablet Take 1 tablet (25 mcg total) by mouth daily before breakfast. 08/20/20   10/18/20, MD  Multiple Vitamin (MULTIVITAMIN ADULT PO) Take by mouth.    [provider]  ondansetron (ZOFRAN) 4 MG tablet Take 1 tablet (4 mg total) by mouth every 8 (eight) hours as needed for nausea or vomiting. Patient not taking: No sig reported 08/27/20   10/25/20, MD  polyethylene glycol (MIRALAX / GLYCOLAX) 17 g packet Take 17 g by mouth 3 (three) times daily.     [provider]  Potassium 99 MG TABS Take 1 tablet by mouth daily.    [provider]  saccharomyces boulardii (FLORASTOR) 250 MG capsule Take  250 mg by mouth 2 (two) times daily.    [provider]  sucralfate (CARAFATE) 1 g tablet Take 1 tablet (1 g total) by mouth 4 (four) times daily -  with meals and at bedtime. 02/06/21   Linwood Dibbles, MD  Ubrogepant (UBRELVY) 50 MG TABS Take 50 mg by mouth once as needed for up to 1 dose. Patient not taking: No sig reported 03/14/20   Wynn Banker, MD    Allergies    Sulfamethoxazole-trimethoprim and Sumatriptan  Review of Systems   Review of Systems  All other systems reviewed and are negative.  Physical Exam Updated Vital Signs BP 133/88   Pulse 80   Temp 98.1 F (36.7 C) (Oral)   Resp 18   Ht 5\' 5"  (1.651 m)   Wt 61 kg   SpO2 100%   BMI 22.38 kg/m   Physical Exam Vitals and nursing note reviewed.  Constitutional:      General: She is not in acute distress.    Appearance: She is well-developed.  HENT:     Head: Normocephalic and atraumatic.  Eyes:     Pupils: Pupils are equal, round, and reactive to light.  Cardiovascular:     Rate and Rhythm: Normal rate and regular rhythm.     Heart sounds: Normal heart sounds. No murmur heard.   No friction rub.  Pulmonary:     Effort: Pulmonary effort is normal.     Breath sounds: Normal breath sounds. No wheezing or rales.  Abdominal:     General: Bowel sounds are normal. There is no distension.     Palpations: Abdomen is soft.     Tenderness: There is no abdominal tenderness. There is no guarding or rebound.  Musculoskeletal:        General: No tenderness. Normal range of motion.     Cervical back: Normal range of motion and neck supple.     Right lower leg: No edema.     Left lower leg: No edema.     Comments: No edema  Skin:    General: Skin is warm and dry.     Findings: No rash.  Neurological:     General: No focal deficit present.      Mental Status: She is alert and oriented to person, place, and time. Mental status is at baseline.     Cranial Nerves: No cranial nerve deficit.  Psychiatric:        Mood and Affect: Mood normal.        Behavior: Behavior normal.    ED Results / Procedures / Treatments   Labs (all labs ordered are listed, but only abnormal results are displayed) Labs Reviewed  BASIC METABOLIC PANEL - Abnormal; Notable for the following components:      Result Value   Sodium 133 (*)    Chloride 97 (*)    Glucose, Bld 103 (*)    All other components within normal limits  CBC - Abnormal; Notable for the following components:   RBC 3.82 (*)    All other components within normal limits  TROPONIN I (HIGH SENSITIVITY)  TROPONIN I (HIGH SENSITIVITY)    EKG EKG Interpretation  Date/Time:  Wednesday February 11 2021 18:04:02 EDT Ventricular Rate:  88 PR Interval:  176 QRS Duration: 74 QT Interval:  364 QTC Calculation: 440 R Axis:   76 Text Interpretation: Normal sinus rhythm Normal ECG No significant change since last tracing Confirmed by [id number] (Gwyneth Sprout) on 02/11/2021 9:08:29 PM  Radiology DG Chest 2 View  Result Date: 02/11/2021 CLINICAL DATA:  Tachycardia EXAM: CHEST - 2 VIEW COMPARISON:  None. FINDINGS: The heart size and mediastinal contours are within normal limits.No focal airspace disease. No pleural effusion or pneumothorax.No acute osseous abnormality. IMPRESSION: No evidence of acute cardiopulmonary disease. Electronically Signed   By: Caprice RenshawJacob  Kahn   On: 02/11/2021 18:53    Procedures Procedures   Medications Ordered in ED Medications - No data to display  ED Course  I have reviewed the triage vital signs and the nursing notes.  Pertinent labs & imaging results that were available during my care of the patient were reviewed by me and considered in my medical decision making (see chart for details).    MDM Rules/Calculators/A&P                           Pleasant  75 year old female presenting today with palpitations.  She has had 2 episodes since Saturday both have resolved within 30 minutes but because she had her second episode early this morning she had spoken with her doctor and they recommend she come in for evaluation.  Today she has felt tired but had no other specific symptoms.  EKG here is within normal limits, troponin is negative, BMP without acute findings and chest x-ray within normal limits.  Patient has a prior history of tachycardia use some years ago where she did wear a monitor.  Unclear if the medications of the 40 mg of Nexium and Carafate are exacerbating these or have anything to do with it.  Reassured patient of normal labs and EKG.  She is planning on following up with her PCP and cardiology as she may need a Zio patch in the future if she continues to have symptoms.  MDM   Amount and/or Complexity of Data Reviewed Clinical lab tests: ordered and reviewed Tests in the radiology section of CPT: ordered and reviewed Tests in the medicine section of CPT: ordered and reviewed Independent visualization of images, tracings, or specimens: yes    Final Clinical Impression(s) / ED Diagnoses Final diagnoses:  Palpitation    Rx / DC Orders ED Discharge Orders     None        Gwyneth SproutPlunkett, Jorge Amparo, MD 02/12/21 0008

## 2021-02-16 ENCOUNTER — Encounter: Payer: Self-pay | Admitting: Internal Medicine

## 2021-02-16 ENCOUNTER — Ambulatory Visit: Payer: Medicare PPO | Admitting: Internal Medicine

## 2021-02-16 VITALS — BP 100/70 | HR 80 | Ht 63.5 in | Wt 136.1 lb

## 2021-02-16 DIAGNOSIS — B3781 Candidal esophagitis: Secondary | ICD-10-CM | POA: Diagnosis not present

## 2021-02-16 NOTE — Progress Notes (Signed)
Alexa Rios 75 y.o. Jun 18, 1946 [id number]  Assessment & Plan:   Encounter Diagnosis  Name Primary?   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  She should take her fluconazole that was prescribed we will discontinue PPI and if this fails to work she will let me know.  I appreciate the opportunity to care for this patient. CC: Alexa Penna, MD  Subjective:   Chief Complaint: Burning in the throat esophagus and stomach  HPI Alexa Rios is a 75 year old woman with a history of IBS and functional dyspepsia who had to take prednisone and then Augmentin over the course of a few weeks.  And then she developed burning in the esophagus and upper abdomen.  Some mild odynophagia type symptoms.  She reluctantly went back on PPI, she also had a migraine with a lot of nausea and vomiting her in that setting.  She is in the emergency department twice for that and then palpitations associated with Carafate.  Carafate was discontinued palpitations went away she retried the Carafate and itching and palpitations occurred again.  Nexium was increased to 40 mg daily.  She continues with the symptoms and brings me pictures of an endoscopy from 2016 with Candida esophagitis and says "I feel just like I did then". NL EGD 5/22 by me.  She tells me she performed a string test or a spit test, where she spits in a cup of water and if there are strings present in the water in the morning, it is indicative of Candida.  Hers was positive.  She has used this test before.  I have reviewed her ER visits where she was ruled out for MI, CT abdomen and pelvis July 22 that was negative chest x-ray - July 27.  EKG is okay. Wt Readings from Last 3 Encounters:  02/16/21 136 lb 2 oz (61.7 kg)  02/11/21 134 lb 7.7 oz (61 kg)  02/06/21 135 lb (61.2 kg)   The prednisone was started  for sciatica and that is gone.  Antibiotic was prescribed for questionable UTI.  She is a little weak but otherwise no significant serious symptoms other than that described above. Allergies  Allergen Reactions   Sulfamethoxazole-Trimethoprim    Sumatriptan     palpitations   Sulfur Dioxide Palpitations   Current Meds  Medication Sig   BIOTIN 5000 PO Take 1 tablet by mouth in the morning and at bedtime.   CALCIUM PO Take 1,200 mg by mouth daily.   dicyclomine (BENTYL) 10 MG capsule Take 10 mg by mouth every 6 (six) hours as needed for spasms.   esomeprazole (NEXIUM) 20 MG capsule Take 20 mg by mouth daily at 12 noon.   Estradiol 1 MG/GM GEL Apply 1mg  twice a week as needed (Patient taking differently: Apply 1 application topically once a week. Apply twice weekly on Wednesday and Sundays)   EVENING PRIMROSE OIL PO Take 500 mg by mouth daily.   gabapentin (NEURONTIN) 100 MG capsule Take 1 capsule by mouth as needed.   Glucosamine-Chondroitin (COSAMIN DS PO) Take 1 tablet by mouth daily.   levothyroxine (SYNTHROID) 25 MCG tablet Take 1 tablet (25 mcg total) by mouth daily before breakfast.   Multiple Vitamin (MULTIVITAMIN ADULT PO) Take by mouth.   polyethylene glycol (MIRALAX / GLYCOLAX) 17 g packet Take 17 g by mouth 2 (two) times daily.   Potassium 99  MG TABS Take 1 tablet by mouth daily.   PREVIDENT 5000 BOOSTER PLUS 1.1 % PSTE Place onto teeth.   saccharomyces boulardii (FLORASTOR) 250 MG capsule Take 250 mg by mouth 2 (two) times daily.   Ubrogepant (UBRELVY) 50 MG TABS Take 50 mg by mouth once as needed for up to 1 dose. (Patient taking differently: Take 50 mg by mouth as needed.)   Past Medical History:  Diagnosis Date   Allergy    Colon polyps    Diverticulitis    GERD (gastroesophageal reflux disease)    History of chicken pox    Hypothyroidism    Mesenteric adenitis    Migraine    Paget's disease of bony pelvis    Status post dilation of esophageal narrowing    Thyroid  disease    Past Surgical History:  Procedure Laterality Date   BREAST BIOPSY Left 1976   benign per patient   BREAST CYST ASPIRATION Right    30 + yrs ago   CHOLECYSTECTOMY  2015   COLONOSCOPY     KIDNEY CYST REMOVAL     REDUCTION MAMMAPLASTY Bilateral 2005   TONSILLECTOMY AND ADENOIDECTOMY  1951   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   Moved to [place] from [place] in May 2021   No alcohol   1 caffeinated beverage daily   Never smoker no drugs no other tobacco   family history includes Arthritis in her sister; Cancer in her brother; Diabetes in her brother and father; Early death in her father; Heart disease in her maternal grandmother and paternal grandmother; High Cholesterol in her maternal grandmother and paternal grandmother; Hyperlipidemia in her brother, father, mother, and sister; Lymphoma in her father; Prostate cancer in her paternal grandfather; Rheum arthritis in her mother; Stroke in her mother and paternal grandfather.   Review of Systems As per HPI  Objective:   Physical Exam BP 100/70 (BP Location: Left Arm, Patient Position: Sitting, Cuff Size: Normal)   Pulse 80   Ht 5' 3.5" (1.613 m)   Wt 136 lb 2 oz (61.7 kg)   BMI 23.74 kg/m  Well-developed well-nourished elderly white woman no acute distress Mouth and posterior pharynx are free of lesions there is no thrush The lung sounds are normal and so are the heart sounds  Data reviewed include ER visits of July 22 and July 27 including all imaging labs and notes

## 2021-02-16 NOTE — Patient Instructions (Signed)
Stop Nexium Take the fluconazole  If that does not relieve your problems let me know.  I appreciate the opportunity to care for you. Iva Boop, MD, [facility]

## 2021-02-17 ENCOUNTER — Other Ambulatory Visit: Payer: Self-pay | Admitting: Internal Medicine

## 2021-02-17 MED ORDER — ONDANSETRON 4 MG PO TBDP: 4.0000 mg | ORAL_TABLET | Freq: Three times a day (TID) | ORAL | 0 refills | Status: DC | PRN

## 2021-02-19 ENCOUNTER — Other Ambulatory Visit: Payer: Self-pay

## 2021-02-19 ENCOUNTER — Emergency Department (HOSPITAL_COMMUNITY): Payer: Medicare PPO

## 2021-02-19 ENCOUNTER — Emergency Department (HOSPITAL_COMMUNITY)
Admission: EM | Admit: 2021-02-19 | Discharge: 2021-02-19 | Disposition: A | Discharge status: Home / Self Care | Payer: Medicare PPO | Attending: Emergency Medicine | Admitting: Emergency Medicine

## 2021-02-19 ENCOUNTER — Encounter (HOSPITAL_COMMUNITY): Payer: Self-pay | Admitting: Emergency Medicine

## 2021-02-19 DIAGNOSIS — E039 Hypothyroidism, unspecified: Secondary | ICD-10-CM | POA: Diagnosis not present

## 2021-02-19 DIAGNOSIS — K2900 Acute gastritis without bleeding: Secondary | ICD-10-CM | POA: Insufficient documentation

## 2021-02-19 DIAGNOSIS — R1013 Epigastric pain: Secondary | ICD-10-CM | POA: Diagnosis present

## 2021-02-19 LAB — CBC WITH DIFFERENTIAL/PLATELET
Abs Immature Granulocytes: 0.02 10*3/uL (ref 0.00–0.07)
Basophils Absolute: 0 10*3/uL (ref 0.0–0.1)
Basophils Relative: 1 %
Eosinophils Absolute: 0 10*3/uL (ref 0.0–0.5)
Eosinophils Relative: 1 %
HCT: 37.9 % (ref 36.0–46.0)
Hemoglobin: 13.2 g/dL (ref 12.0–15.0)
Immature Granulocytes: 0 %
Lymphocytes Relative: 28 %
Lymphs Abs: 1.9 10*3/uL (ref 0.7–4.0)
MCH: 32.4 pg (ref 26.0–34.0)
MCHC: 34.8 g/dL (ref 30.0–36.0)
MCV: 92.9 fL (ref 80.0–100.0)
Monocytes Absolute: 0.7 10*3/uL (ref 0.1–1.0)
Monocytes Relative: 10 %
Neutro Abs: 4 10*3/uL (ref 1.7–7.7)
Neutrophils Relative %: 60 %
Platelets: 254 10*3/uL (ref 150–400)
RBC: 4.08 MIL/uL (ref 3.87–5.11)
RDW: 12.1 % (ref 11.5–15.5)
WBC: 6.6 10*3/uL (ref 4.0–10.5)
nRBC: 0 % (ref 0.0–0.2)

## 2021-02-19 LAB — TROPONIN I (HIGH SENSITIVITY)
Troponin I (High Sensitivity): 3 ng/L (ref <18)
Troponin I (High Sensitivity): 3 ng/L (ref <18)

## 2021-02-19 LAB — COMPREHENSIVE METABOLIC PANEL
ALT: 22 U/L (ref 0–44)
AST: 28 U/L (ref 15–41)
Albumin: 4.2 g/dL (ref 3.5–5.0)
Alkaline Phosphatase: 92 U/L (ref 38–126)
Anion gap: 11 (ref 5–15)
BUN: 17 mg/dL (ref 8–23)
CO2: 23 mmol/L (ref 22–32)
Calcium: 9.8 mg/dL (ref 8.9–10.3)
Chloride: 98 mmol/L (ref 98–111)
Creatinine, Ser: 0.54 mg/dL (ref 0.44–1.00)
GFR, Estimated: >60 mL/min (ref >60)
Glucose, Bld: 92 mg/dL (ref 70–99)
Potassium: 3.5 mmol/L (ref 3.5–5.1)
Sodium: 132 mmol/L — ABNORMAL LOW (ref 135–145)
Total Bilirubin: 0.6 mg/dL (ref 0.3–1.2)
Total Protein: 7 g/dL (ref 6.5–8.1)

## 2021-02-19 LAB — URINALYSIS, ROUTINE W REFLEX MICROSCOPIC
Bacteria, UA: NONE SEEN
Bilirubin Urine: NEGATIVE
Glucose, UA: NEGATIVE mg/dL
Ketones, ur: NEGATIVE mg/dL
Leukocytes,Ua: NEGATIVE
Nitrite: NEGATIVE
Protein, ur: NEGATIVE mg/dL
Specific Gravity, Urine: 1.002 — ABNORMAL LOW (ref 1.005–1.030)
pH: 7 (ref 5.0–8.0)

## 2021-02-19 LAB — LIPASE, BLOOD: Lipase: 30 U/L (ref 11–51)

## 2021-02-19 MED ORDER — LIDOCAINE VISCOUS HCL 2 % MT SOLN
15.0000 mL | Freq: Once | OROMUCOSAL | Status: AC
  Administered 2021-02-19: 15 mL via ORAL
  Filled 2021-02-19: qty 15

## 2021-02-19 MED ORDER — IOHEXOL 350 MG/ML SOLN
80.0000 mL | Freq: Once | INTRAVENOUS | Status: AC | PRN
  Administered 2021-02-19: 80 mL via INTRAVENOUS

## 2021-02-19 MED ORDER — ONDANSETRON HCL 4 MG/2ML IJ SOLN
4.0000 mg | Freq: Once | INTRAMUSCULAR | Status: AC
Start: 2021-02-19 — End: 2021-02-19
  Administered 2021-02-19: 4 mg via INTRAVENOUS
  Filled 2021-02-19: qty 2

## 2021-02-19 MED ORDER — ESOMEPRAZOLE MAGNESIUM 40 MG PO CPDR: 40.0000 mg | DELAYED_RELEASE_CAPSULE | Freq: Every day | ORAL | 0 refills | Status: DC

## 2021-02-19 MED ORDER — ALUM & MAG HYDROXIDE-SIMETH 200-200-20 MG/5ML PO SUSP
30.0000 mL | Freq: Once | ORAL | Status: AC
  Administered 2021-02-19: 30 mL via ORAL
  Filled 2021-02-19: qty 30

## 2021-02-19 NOTE — Discharge Instructions (Addendum)
You are seen in the ER today for your epigastric pain and burning sensation in your throat.  Your physical exam, blood work, CT scan and EKG were very reassuring.  Given that your symptoms improved after administration of GI cocktail, do suspect that your symptoms are secondary to gastritis, which is inflammation of the lining of your stomach.  Please resume taking Nexium which has been prescribed to your pharmacy.  Take in addition to the fluconazole as prescribed by your gastroenterologist.  Please follow-up with him as soon as possible; you may require an endoscopy for further evaluation.  Return to the ER with any new or worsening abdominal pain, nausea or vomiting does not stop, chest pain, difficulty breathing, or any other new severe symptom.

## 2021-02-19 NOTE — ED Triage Notes (Signed)
Pt reports "significant stomach issues" x 1 month. Has followed up with GI for same. Reports pain is aching. Endorses nausea. Was recently seen at [facility] for same.

## 2021-02-19 NOTE — ED Provider Notes (Signed)
[facility]-EMERGENCY DEPT Provider Note   CSN: [id number] Arrival date & time: 02/19/21  1601     History Chief Complaint  Patient presents with   Abdominal Pain   Nausea    Alexa Rios is a 75 y.o. female with history of candidal esophagitis in the past who is currently undergoing treatment for suspected Candida esophagitis with fluconazole who presents with worsening epigastric pain, central chest burning and burning in her throat, cold sweats, nausea, and general poor p.o. tolerance and weakness.  Patient is following with gastroenterologist Dr. Leone Payor for management of this.  Has been seen multiple times in the emergency department for the same concerns.  Was initially placed on Carafate and PPI which was discontinued by her gastroenterologist.  Presents today requesting endoscopy, as Dr. Leone Payor stated he would schedule her only after failure to improve with fluconazole.  Additional endorses that her blood pressure is usually 100/70, and feels that her blood pressure has been significantly elevated with the highest blood pressure of 130/80's in the outpatient setting.  I personally read this patient medical records.  She has history of diverticulitis with intramural abscess in February 2022, Paget's disease, candidal esophagitis, GERD, hypothyroidism, and chronic constipation.  She is not anticoagulated.  HPI     Past Medical History:  Diagnosis Date   Allergy    Candida esophagitis (HCC)    Colon polyps    Diverticulitis    GERD (gastroesophageal reflux disease)    History of chicken pox    Hypothyroidism    Mesenteric adenitis    Migraine    Paget's disease of bony pelvis    Status post dilation of esophageal narrowing    Thyroid disease     Patient Active Problem List   Diagnosis Date Noted   Paget's disease of bony pelvis    Mild anemia 09/01/2020   Abnormal CT scan, pelvis-question Paget's disease 09/01/2020   Diverticulitis  08/16/2020   Chronic constipation 07/31/2020   Trigeminal neuralgia of right side of face 03/15/2020   Occipital neuralgia of right side 03/15/2020   Diverticulosis 03/15/2020   Migraine headache with aura 03/15/2020   Hypothyroid 03/15/2020   Environmental and seasonal allergies 03/15/2020   Heartburn 03/15/2020    Past Surgical History:  Procedure Laterality Date   BREAST BIOPSY Left 1976   benign per patient   BREAST CYST ASPIRATION Right    30 + yrs ago   CHOLECYSTECTOMY  2015   COLONOSCOPY     KIDNEY CYST REMOVAL     REDUCTION MAMMAPLASTY Bilateral 2005   TONSILLECTOMY AND ADENOIDECTOMY  1951   VAGINAL HYSTERECTOMY  2000   benign path     OB History     Gravida      Para      Term      Preterm      AB      Living  1      SAB      IAB      Ectopic      Multiple      Live Births              Family History  Problem Relation Age of Onset   Rheum arthritis Mother    Hyperlipidemia Mother    Stroke Mother    Lymphoma Father    Diabetes Father    Early death Father    Hyperlipidemia Father    Cancer Brother  ureteral   Diabetes Brother    Hyperlipidemia Brother    Arthritis Sister    Hyperlipidemia Sister    Heart disease Maternal Grandmother    High Cholesterol Maternal Grandmother    Heart disease Paternal Grandmother    High Cholesterol Paternal Grandmother    Prostate cancer Paternal Grandfather    Stroke Paternal Grandfather    Colon cancer Neg Hx    Pancreatic cancer Neg Hx    Esophageal cancer Neg Hx     Social History   Tobacco Use   Smoking status: Never   Smokeless tobacco: Never  Vaping Use   Vaping Use: Never used  Substance Use Topics   Alcohol use: Not Currently   Drug use: Never    Home Medications Prior to Admission medications   Medication Sig Start Date End Date Taking? Authorizing Provider  esomeprazole (NEXIUM) 40 MG capsule Take 1 capsule (40 mg total) by mouth daily. 02/19/21  Yes Zeinab Rodwell,  Khalidah Herbold R, PA-C  BIOTIN 5000 PO Take 1 tablet by mouth in the morning and at bedtime.    [provider]  CALCIUM PO Take 1,200 mg by mouth daily.    [provider]  dicyclomine (BENTYL) 10 MG capsule Take 10 mg by mouth every 6 (six) hours as needed for spasms. 09/01/20   Iva BoopGessner, Carl E, MD  Estradiol 1 MG/GM GEL Apply 1mg  twice a week as needed Patient taking differently: Apply 1 application topically once a week. Apply twice weekly on Wednesday and Sundays 04/09/20   Wynn BankerKoberlein, Junell C, MD  EVENING PRIMROSE OIL PO Take 500 mg by mouth daily.    [provider]  fluconazole (DIFLUCAN) 100 MG tablet Take 2 tablets day 1 then 1 daily until finished 02/16/21 03/02/21  Iva BoopGessner, Carl E, MD  gabapentin (NEURONTIN) 100 MG capsule Take 1 capsule by mouth as needed. 12/06/20   [provider]  Glucosamine-Chondroitin (COSAMIN DS PO) Take 1 tablet by mouth daily.    [provider]  levothyroxine (SYNTHROID) 25 MCG tablet Take 1 tablet (25 mcg total) by mouth daily before breakfast. 08/20/20   Lewie ChamberGirguis, David, MD  Multiple Vitamin (MULTIVITAMIN ADULT PO) Take by mouth.    [provider]  ondansetron (ZOFRAN-ODT) 4 MG disintegrating tablet Take 1 tablet (4 mg total) by mouth every 8 (eight) hours as needed for nausea or vomiting. 02/17/21   Iva BoopGessner, Carl E, MD  polyethylene glycol (MIRALAX / GLYCOLAX) 17 g packet Take 17 g by mouth 2 (two) times daily.    [provider]  Potassium 99 MG TABS Take 1 tablet by mouth daily.    [provider]  PREVIDENT 5000 BOOSTER PLUS 1.1 % PSTE Place onto teeth. 11/26/20   [provider]  saccharomyces boulardii (FLORASTOR) 250 MG capsule Take 250 mg by mouth 2 (two) times daily.    [provider]  Ubrogepant (UBRELVY) 50 MG TABS Take 50 mg by mouth once as needed for up to 1 dose. Patient taking differently: Take 50 mg by mouth as needed. 03/14/20   Wynn BankerKoberlein, Junell C, MD    Allergies     Sulfamethoxazole-trimethoprim, Sumatriptan, Carafate [sucralfate], and Sulfur dioxide  Review of Systems   Review of Systems  Constitutional:  Positive for activity change, appetite change, chills, diaphoresis and fatigue. Negative for fever.  HENT: Negative.    Eyes: Negative.   Respiratory: Negative.    Cardiovascular: Negative.   Gastrointestinal:  Positive for abdominal pain and nausea. Negative for anal bleeding,  blood in stool, constipation, diarrhea, rectal pain and vomiting.       Denies melena or hematochezia  Genitourinary: Negative.   Musculoskeletal: Negative.   Skin: Negative.   Neurological:  Positive for weakness and light-headedness. Negative for dizziness, syncope and headaches.   Physical Exam Updated Vital Signs BP 119/81   Pulse 85   Temp 97.7 F (36.5 C) (Oral)   Resp 14   Ht 5' 3.5" (1.613 m)   Wt 59.4 kg   SpO2 97%   BMI 22.84 kg/m   Physical Exam Vitals and nursing note reviewed.  Constitutional:      Appearance: She is normal weight. She is not toxic-appearing.  HENT:     Head: Normocephalic and atraumatic.     Nose: Nose normal.     Mouth/Throat:     Mouth: Mucous membranes are moist.     Pharynx: Oropharynx is clear. Uvula midline. No oropharyngeal exudate or posterior oropharyngeal erythema.     Tonsils: No tonsillar exudate.  Eyes:     General: Lids are normal. Vision grossly intact. No scleral icterus.       Right eye: No discharge.        Left eye: No discharge.     Extraocular Movements: Extraocular movements intact.     Conjunctiva/sclera: Conjunctivae normal.     Pupils: Pupils are equal, round, and reactive to light.  Neck:     Trachea: Trachea and phonation normal.  Cardiovascular:     Rate and Rhythm: Normal rate and regular rhythm.     Pulses: Normal pulses.     Heart sounds: Normal heart sounds. No murmur heard. Pulmonary:     Effort: Pulmonary effort is normal. No tachypnea, bradypnea, accessory muscle usage, prolonged  expiration or respiratory distress.     Breath sounds: Normal breath sounds. No wheezing or rales.  Chest:     Chest wall: No mass, lacerations, deformity, swelling, tenderness, crepitus or edema.  Abdominal:     General: Bowel sounds are normal. There is no distension.     Palpations: Abdomen is soft.     Tenderness: There is abdominal tenderness in the epigastric area and periumbilical area. There is no right CVA tenderness, left CVA tenderness, guarding or rebound. Negative signs include Murphy's sign and McBurney's sign.  Musculoskeletal:        General: No deformity.     Cervical back: Normal range of motion and neck supple. No edema, rigidity or crepitus. No pain with movement, spinous process tenderness or muscular tenderness.     Right lower leg: No edema.     Left lower leg: No edema.  Lymphadenopathy:     Cervical: No cervical adenopathy.  Skin:    General: Skin is warm and dry.     Capillary Refill: Capillary refill takes less than 2 seconds.  Neurological:     Mental Status: She is alert. Mental status is at baseline.     GCS: GCS eye subscore is 4. GCS verbal subscore is 5. GCS motor subscore is 6.     Sensory: Sensation is intact.     Motor: Motor function is intact.     Gait: Gait is intact.  Psychiatric:        Mood and Affect: Mood normal.    ED Results / Procedures / Treatments   Labs (all labs ordered are listed, but only abnormal results are displayed) Labs Reviewed  COMPREHENSIVE METABOLIC PANEL - Abnormal; Notable for the following components:  Result Value   Sodium 132 (*)    All other components within normal limits  URINALYSIS, ROUTINE W REFLEX MICROSCOPIC - Abnormal; Notable for the following components:   Color, Urine STRAW (*)    Specific Gravity, Urine 1.002 (*)    Hgb urine dipstick MODERATE (*)    All other components within normal limits  LIPASE, BLOOD  CBC WITH DIFFERENTIAL/PLATELET  TROPONIN I (HIGH SENSITIVITY)  TROPONIN I (HIGH  SENSITIVITY)    EKG EKG: normal sinus rhythm, diffuse ST mild depressions, no STEMI.   Radiology DG Chest 2 View  Result Date: 02/19/2021 CLINICAL DATA:  Chest pain. EXAM: CHEST - 2 VIEW COMPARISON:  Chest radiograph dated 02/11/2021 FINDINGS: Mild chronic interstitial coarsening and bronchitic changes. No focal consolidation, pleural effusion, or pneumothorax. The cardiac silhouette is within limits. No acute osseous pathology. IMPRESSION: No active cardiopulmonary disease. Electronically Signed   By: Elgie Collard M.D.   On: 02/19/2021 19:30   CT Abdomen Pelvis W Contrast  Result Date: 02/19/2021 CLINICAL DATA:  Left lower quadrant abdominal pain. General abdomen pain 6 weeks. EXAM: CT ABDOMEN AND PELVIS WITH CONTRAST TECHNIQUE: Multidetector CT imaging of the abdomen and pelvis was performed using the standard protocol following bolus administration of intravenous contrast. CONTRAST:  71mL OMNIPAQUE IOHEXOL 350 MG/ML SOLN COMPARISON:  CT abdomen pelvis 02/06/2021, CT abdomen pelvis 08/15/2020 FINDINGS: Lower chest: No acute abnormality. Hepatobiliary: Subcentimeter hypodensities too small to characterize. Otherwise no focal liver abnormality. No gallstones, gallbladder wall thickening, or pericholecystic fluid. The common bile duct is enlarged measuring up to 1 cm which can be seen in the post cholecystectomy setting. This is stable compared to January. Stable mild central intrahepatic biliary ductal dilatation. Pancreas: No focal lesion. Normal pancreatic contour. No surrounding inflammatory changes. No main pancreatic ductal dilatation. Spleen: Normal in size without focal abnormality. Adrenals/Urinary Tract: No adrenal nodule bilaterally. Bilateral kidneys enhance symmetrically. 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 Subcentimeter hypodensities are too small to characterize. No hydronephrosis. No hydroureter. The urinary bladder is unremarkable. On delayed  imaging, there is no urothelial wall thickening and there are no filling defects in the opacified portions of the bilateral collecting systems or ureters. Stomach/Bowel: Stomach is within normal limits. No evidence of bowel wall thickening or dilatation. Scattered sigmoid diverticulosis with no acute diverticulitis. The appendix is unremarkable. Vascular/Lymphatic: No abdominal aorta or iliac aneurysm. Mild atherosclerotic plaque of the aorta and its branches. No abdominal, pelvic, or inguinal lymphadenopathy. Reproductive: Status post hysterectomy. No adnexal masses. Other: No intraperitoneal free fluid. No intraperitoneal free gas. No organized fluid collection. Musculoskeletal: No abdominal wall hernia or abnormality. Similar-appearing cortical coarsening with heterogeneous appearance of the iliac bone consistent with known Paget's disease. No suspicious lytic or blastic osseous lesions. No acute displaced fracture. Mild dextroscoliosis of the lower lumbar spine with compensatory mild levocurvature of the thoracolumbar spine. Grade 1 anterolisthesis of L4 on L5. IMPRESSION: 1. No acute intra-abdominal or intrapelvic abnormality. 2. Scattered sigmoid diverticulosis with no acute diverticulitis. 3.  Aortic Atherosclerosis (ICD10-I70.0). Electronically Signed   By: Tish Frederickson M.D.   On: 02/19/2021 19:47    Procedures Procedures   Medications Ordered in ED Medications  ondansetron (ZOFRAN) injection 4 mg (4 mg Intravenous Given 02/19/21 1748)  alum & mag hydroxide-simeth (MAALOX/MYLANTA) 200-200-20 MG/5ML suspension 30 mL (30 mLs Oral Given 02/19/21 1847)    And  lidocaine (XYLOCAINE) 2 % viscous mouth solution 15 mL (15 mLs Oral Given 02/19/21 1848)  iohexol (OMNIPAQUE) 350  MG/ML injection 80 mL (80 mLs Intravenous Contrast Given 02/19/21 1915)    ED Course  I have reviewed the triage vital signs and the nursing notes.  Pertinent labs & imaging results that were available during my care of the patient  were reviewed by me and considered in my medical decision making (see chart for details).    MDM Rules/Calculators/A&P                         75 year old female with history of candidal esophagitis who presents with concern for worsening epigastric pain and central chest burning as well as generally feeling poorly.  Differential diagnosis includes is limited to GERD, esophagitis, peptic ulcer disease, gastritis, pancreatitis, hepatitis, pyelonephritis, pneumonia, ACS, aortic dissection, bowel obstruction.  Vital signs are normal intake.  Cardiopulmonary exam is normal, abdominal exam with epigastric and periumbilical tenderness to palpation without guarding or rebound.  Patient is clearly anxious and appears frustrated with her chronic GI issues.  We will proceed with labs and CT of the abdomen pelvis.  Additionally will offer GI cocktail.  CBC unremarkable, CMP with mild hyponatremia 132.  UA unremarkable.  Lipase is normal, troponin is negative, 3, delta troponin also negative, 3.  EKG reassuring as well as CT without any acute intra-abdominal or pelvic abnormality.  Patient reevaluated after GI cocktail with significant improvement in her epigastric pain.  Do suspect 8 given HPI and work-up that patient's symptoms are secondary to gastritis.  Recommend that she resume taking PPI in addition to the fluconazole for possible candidal esophagitis as prescribed by her gastroenterologist.  Patient does have Zofran at home, had adverse reaction to Carafate.  Tolerated Maalox/Mylanta in the emergency department.  Will recommend this over-the-counter treatment at home as tolerated.  Recommend close outpatient GI follow-up.  No further work-up warranted in ED at this time given reassuring work-up and physical exam.  Seleny voiced understanding of her medical evaluation and treatment plan.  Each of her questions was answered to her expressed satisfaction.  Return precautions given.  Patient is  well-appearing, stable, and appropriate for discharge at this time.  This chart was dictated using voice recognition software, Dragon. Despite the best efforts of this provider to proofread and correct errors, errors may still occur which can change documentation meaning.   This chart was dictated using voice recognition software, Dragon. Despite the best efforts of this provider to proofread and correct errors, errors may still occur which can change documentation meaning.  Final Clinical Impression(s) / ED Diagnoses Final diagnoses:  Acute gastritis, presence of bleeding unspecified, unspecified gastritis type    Rx / DC Orders ED Discharge Orders          Ordered    esomeprazole (NEXIUM) 40 MG capsule  Daily        02/19/21 2055             Sherrilee Gilles 02/19/21 2101    Bethann Berkshire, MD 02/22/21 [phone]

## 2021-02-25 ENCOUNTER — Ambulatory Visit (INDEPENDENT_AMBULATORY_CARE_PROVIDER_SITE_OTHER): Payer: Medicare PPO | Admitting: Internal Medicine

## 2021-02-25 ENCOUNTER — Encounter: Payer: Self-pay | Admitting: Internal Medicine

## 2021-02-25 VITALS — BP 102/60 | HR 83 | Ht 65.0 in | Wt 134.2 lb

## 2021-02-25 DIAGNOSIS — R1011 Right upper quadrant pain: Secondary | ICD-10-CM

## 2021-02-25 DIAGNOSIS — B3781 Candidal esophagitis: Secondary | ICD-10-CM | POA: Diagnosis not present

## 2021-02-25 DIAGNOSIS — T50905A Adverse effect of unspecified drugs, medicaments and biological substances, initial encounter: Secondary | ICD-10-CM

## 2021-02-25 DIAGNOSIS — R1013 Epigastric pain: Secondary | ICD-10-CM

## 2021-02-25 MED ORDER — PANTOPRAZOLE SODIUM 40 MG PO TBEC: 40.0000 mg | DELAYED_RELEASE_TABLET | Freq: Every day | ORAL | 1 refills | Status: DC

## 2021-02-25 NOTE — Progress Notes (Signed)
Alexa Rios 75 y.o. May 29, 1946 [id number]  Assessment & Plan:   Encounter Diagnoses  Name Primary?   Dyspepsia Yes   RUQ pain    Medication reaction- suspected esomeprazole intolerance    Candida esophagitis (HCC) - suspected     She is improved.  We will continue current treatment.  I will switch to pantoprazole from generic Nexium as it sounds like she has headaches triggered by that.  It may have been responsible for some of her abdominal pain perhaps.  I do not think endoscopic evaluation is needed, I had wondered about perhaps performing a barium swallow to look at motility though only some of her symptoms seem plausibly related to dysmotility.  She seems to have just a very sensitive GI tract and any insult really causes severe symptoms though often the objective data is not correlative.  She will see me in October for follow-up, it may be that she is best served by some regular follow-up rather than as needed.  She may stop the pantoprazole when she feels like her symptoms are completely resolved stop it abruptly versus tapering depending upon how that goes.   I appreciate the opportunity to care for this patient. CC: [name], MD   Subjective:   Chief Complaint: Chest and abdominal pain  HPI 75 year old woman with suspected Candida esophagitis on visit of February 16, 2021, who has had long standing history of functional GI problems and returns with continued issues.  Her last EGD was in May of this year and was normal.  She also had an EGD in October 2021 at that time had normal esophageal biopsies and empiric esophageal dilation.  52 French max.  She went back to the emergency department on 02/20/2020 with chest pain and a chest x-ray and CT scanning of the abdomen pelvis were unremarkable and cardiac enzymes and EKG were unrevealing.  She is almost finished with fluconazole that we prescribed empirically and she is actually noting that she is about 20  to 30% better.  She had an increase in headache with Nexium she thinks and she occasionally takes an anti-inflammatory when she has that headache.  She said her sister insisted she tell me that.  She does not take NSAIDs regularly.  She had a lot of right upper quadrant/mid quadrant pain and bloating as well but that is better.  She is eating frequent small meals.  She has gained a couple of pounds.  She is a little concerned about her blood pressure at this point.  It is normal today.   While she was ill she had to miss her 56th high school reunion that was to be at [employer], she is from [facility]. Allergies  Allergen Reactions   Nexium [Esomeprazole Magnesium] Other (See Comments)    Headaches and ? Abdominal pain   Sulfamethoxazole-Trimethoprim    Sumatriptan     palpitations   Carafate [Sucralfate] Palpitations   Sulfur Dioxide Palpitations   Current Meds  Medication Sig   BIOTIN 5000 PO Take 1 tablet by mouth in the morning and at bedtime.   CALCIUM PO Take 1,200 mg by mouth daily.   dicyclomine (BENTYL) 10 MG capsule Take 10 mg by mouth every 6 (six) hours as needed for spasms.   Estradiol 1 MG/GM GEL Apply 1mg  twice a week as needed (Patient taking differently: Apply 1 application topically once a week. Apply twice weekly on Wednesday and Sundays)   EVENING PRIMROSE OIL PO Take 500  mg by mouth daily.   fluconazole (DIFLUCAN) 100 MG tablet Take 2 tablets day 1 then 1 daily until finished   gabapentin (NEURONTIN) 100 MG capsule Take 1 capsule by mouth as needed.   Glucosamine-Chondroitin (COSAMIN DS PO) Take 1 tablet by mouth daily.   levothyroxine (SYNTHROID) 25 MCG tablet Take 1 tablet (25 mcg total) by mouth daily before breakfast.   Multiple Vitamin (MULTIVITAMIN ADULT PO) Take by mouth.   ondansetron (ZOFRAN-ODT) 4 MG disintegrating tablet Take 1 tablet (4 mg total) by mouth every 8 (eight) hours as needed for nausea or vomiting.   pantoprazole (PROTONIX) 40 MG  tablet Take 1 tablet (40 mg total) by mouth daily before breakfast.   polyethylene glycol (MIRALAX / GLYCOLAX) 17 g packet Take 17 g by mouth 2 (two) times daily.   Potassium 99 MG TABS Take 1 tablet by mouth daily.   PREVIDENT 5000 BOOSTER PLUS 1.1 % PSTE Place onto teeth.   saccharomyces boulardii (FLORASTOR) 250 MG capsule Take 250 mg by mouth 2 (two) times daily.   Ubrogepant (UBRELVY) 50 MG TABS Take 50 mg by mouth once as needed for up to 1 dose. (Patient taking differently: Take 50 mg by mouth as needed.)   [DISCONTINUED] esomeprazole (NEXIUM) 40 MG capsule Take 1 capsule (40 mg total) by mouth daily.   Past Medical History:  Diagnosis Date   Allergy    Candida esophagitis (HCC)    Colon polyps    Diverticulitis    GERD (gastroesophageal reflux disease)    History of chicken pox    Hypothyroidism    Mesenteric adenitis    Migraine    Paget's disease of bony pelvis    Status post dilation of esophageal narrowing    Thyroid disease    Past Surgical History:  Procedure Laterality Date   BREAST BIOPSY Left 1976   benign per patient   BREAST CYST ASPIRATION Right    30 + yrs ago   CHOLECYSTECTOMY  2015   COLONOSCOPY     KIDNEY CYST REMOVAL     REDUCTION MAMMAPLASTY Bilateral 2005   TONSILLECTOMY AND ADENOIDECTOMY  1951   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   Moved to [place] from [place] in May 2021   No alcohol   1 caffeinated beverage daily   Never smoker no drugs no other tobacco   family history includes Arthritis in her sister; Cancer in her brother; Diabetes in her brother and father; Early death in her father; Heart disease in her maternal grandmother and paternal grandmother; High Cholesterol in her maternal grandmother and paternal grandmother; Hyperlipidemia in her brother, father, mother, and sister; Lymphoma in her father;  Prostate cancer in her paternal grandfather; Rheum arthritis in her mother; Stroke in her mother and paternal grandfather.   Review of Systems As per HPI  Objective:   Physical Exam BP 102/60   Pulse 83   Ht 5\' 5"  (1.651 m)   Wt 134 lb 3.2 oz (60.9 kg)   SpO2 97%   BMI 22.33 kg/m  NAD Abdomen mild RUQ but not rib tenderness and Carnett negative

## 2021-02-25 NOTE — Patient Instructions (Signed)
Glad your feeling better, hope you continue to improve.  We have sent the following medications to your pharmacy for you to pick up at your convenience: Pantoprazole to replace your esomeprazole . Take the pantoprazole until you are all better and then stop it.  Follow up with [place] in October.   I appreciate the opportunity to care for you. Stan Head, MD, [facility]

## 2021-03-02 ENCOUNTER — Other Ambulatory Visit: Payer: Self-pay | Admitting: Internal Medicine

## 2021-03-02 MED ORDER — FLUCONAZOLE 150 MG PO TABS: 150.0000 mg | ORAL_TABLET | Freq: Once | ORAL | 0 refills | Status: AC

## 2021-03-02 NOTE — Progress Notes (Addendum)
Cardiology Office Note   Date:  03/04/2021   ID:  Alexa, Rios 11-Dec-1945, MRN [id number]  PCP:  [name], MD  Cardiologist:   Dietrich Pates, MD   Pt referred for evaluation of CP     History of Present Illness: Alexa Rios is a 75 y.o. female who is referred for CP  The pt has a hx of GERD   Followed at [facility]  Documented candida esophagitis,, gastritis and HH.  Seen in urgent care for CP in Oct 2021 for CP   Felt to be due to GI IN winter 2021/2022 developed abdominal pains     Finally in Feb 2022 she was admitted and treated for intraperitoneal abscess   She had multiple visists after for problems   Echo ordered by PCP   On 11/03/20 showed LVEF 60 to 65% In April she presented to ED with palpitations and dizziness  Felt things "tunnel down"   She did not pass out   Note that her  abx that were prescribed had been  chnaged during time  Seen in ED   Nothing found   Recomm cardiology follow up  Seen in ED on 6/10 with HA/ migraine   Seen in ED on 7/22 with adominal pain  Seen in ED on 02/11/21 with palpitations   Per ER note had dscomfort in abdomen prior   Carafate had been stared.     Woke up Saturday night with palpitaions that lasted 20 min   Next couple days felt OK but then again woke up with palpitations/heart racing.  Lasted 20 min. Concerned it was a reaction to carafate    Since seen the pt has had no further spells   She continues to have problems with stomach   Just told she has 3+ blood in urine  Pt wore a monitor once in past  [facility], [place]; no records)      Current Meds  Medication Sig   BIOTIN 5000 PO Take 1 tablet by mouth in the morning and at bedtime.   CALCIUM PO Take 1,200 mg by mouth daily.   Estradiol 1 MG/GM GEL Apply 1mg  twice a week as needed (Patient taking differently: Apply 1 application topically once a week. Apply twice weekly on Wednesday and Sundays)   EVENING PRIMROSE OIL PO Take 500 mg by mouth daily.    gabapentin (NEURONTIN) 100 MG capsule Take 1 capsule by mouth as needed.   Glucosamine-Chondroitin (COSAMIN DS PO) Take 1 tablet by mouth daily.   levothyroxine (SYNTHROID) 25 MCG tablet Take 1 tablet (25 mcg total) by mouth daily before breakfast.   Multiple Vitamin (MULTIVITAMIN ADULT PO) Take by mouth.   ondansetron (ZOFRAN-ODT) 4 MG disintegrating tablet Take 1 tablet (4 mg total) by mouth every 8 (eight) hours as needed for nausea or vomiting.   polyethylene glycol (MIRALAX / GLYCOLAX) 17 g packet Take 17 g by mouth 2 (two) times daily.   Potassium 99 MG TABS Take 1 tablet by mouth daily.   PREVIDENT 5000 BOOSTER PLUS 1.1 % PSTE Place onto teeth.   saccharomyces boulardii (FLORASTOR) 250 MG capsule Take 250 mg by mouth 2 (two) times daily.   Ubrogepant (UBRELVY) 50 MG TABS Take 50 mg by mouth once as needed for up to 1 dose. (Patient taking differently: Take 50 mg by mouth as needed.)     Allergies:   Nexium [esomeprazole magnesium], Sulfamethoxazole-trimethoprim, Sumatriptan, Carafate [sucralfate], and Sulfur dioxide   Past Medical History:  Diagnosis Date   Allergy    Candida esophagitis (HCC)    Colon polyps    Diverticulitis    GERD (gastroesophageal reflux disease)    History of chicken pox    Hypothyroidism    Mesenteric adenitis    Migraine    Paget's disease of bony pelvis    Status post dilation of esophageal narrowing    Thyroid disease     Past Surgical History:  Procedure Laterality Date   BREAST BIOPSY Left 1976   benign per patient   BREAST CYST ASPIRATION Right    30 + yrs ago   CHOLECYSTECTOMY  2015   COLONOSCOPY     KIDNEY CYST REMOVAL     REDUCTION MAMMAPLASTY Bilateral 2005   TONSILLECTOMY AND ADENOIDECTOMY  1951   VAGINAL HYSTERECTOMY  2000   benign path     Social History:  The patient  reports that she has never smoked. She has never used smokeless tobacco. She reports that she does not currently use alcohol. She reports that she does not use  drugs.   Family History:  The patient's family history includes Arthritis in her sister; Cancer in her brother; Diabetes in her brother and father; Early death in her father; Heart disease in her maternal grandmother and paternal grandmother; High Cholesterol in her maternal grandmother and paternal grandmother; Hyperlipidemia in her brother, father, mother, and sister; Lymphoma in her father; Prostate cancer in her paternal grandfather; Rheum arthritis in her mother; Stroke in her mother and paternal grandfather.    ROS:  Please see the history of present illness. All other systems are reviewed and  Negative to the above problem except as noted.    PHYSICAL EXAM: VS:  BP 120/74   Pulse 96   Ht 5\' 5"  (1.651 m)   Wt 135 lb 3.2 oz (61.3 kg)   SpO2 94%   BMI 22.50 kg/m    Ortho:  BP 116/68  P 89   Sitting  116/64  P 91  Standing 110/76  P 79  Standing 122/78  P 84   GEN: Well nourished, well developed, in no acute distress  HEENT: normal  Neck: no JVD, carotid bruits, or masses Cardiac: RRR; no murmurs, rubs, or gallops,no edema  Respiratory:  clear to auscultation bilaterally, normal work of breathing GI: soft, nontender, nondistended, + BS  No hepatomegaly  MS: no deformity Moving all extremities   Skin: warm and dry, no rash Neuro:  Strength and sensation are intact Psych: euthymic mood, full affect   EKG:  EKG is not ordered today.   Lipid Panel    Component Value Date/Time   CHOL 192 03/14/2020 1533   TRIG 79 03/14/2020 1533   HDL 78 03/14/2020 1533   CHOLHDL 2.5 03/14/2020 1533   LDLCALC 97 03/14/2020 1533      Wt Readings from Last 3 Encounters:  03/03/21 135 lb 3.2 oz (61.3 kg)  02/25/21 134 lb 3.2 oz (60.9 kg)  02/19/21 131 lb (59.4 kg)      ASSESSMENT AND PLAN:  1  Chest pain   I don not think it is cardiac in origin    She does have a hiatal hernai and reflux/UGI dz   Follow   2  Palpitatoins / dizziness    No arrhythmias documented   She is not  orthostatic.   Discussed Kardia device.   She is concerned about vascular dz in neck   Will go ahead and order though I am not  convinced a problem    Stay hydrated  Pt has had significant GI issues over past year  This can effect BP, fluid absorption/regulatoin   Follow    3  Atherosclerosis   Pt with evid of mild atherosclerosis of aorta    Will review carotid scan   Pt should be on statin     4  Lipids  LDL 113  HDL 63   Will review CV scan   Has mild plaquing of aorta   Working on diet but should be on a statin   Get back with pt    Current medicines are reviewed at length with the patient today.  The patient does not have concerns regarding medicines.  Signed, Dietrich Pates, MD  03/04/2021 10:28 AM    [facility] [address] [place], [place], [place]  [address] Phone: [phone]; Fax: [phone]

## 2021-03-03 ENCOUNTER — Ambulatory Visit
Admission: RE | Admit: 2021-03-03 | Discharge: 2021-03-03 | Disposition: A | Discharge status: Home / Self Care | Payer: Medicare PPO | Source: Ambulatory Visit | Attending: Internal Medicine | Admitting: Internal Medicine

## 2021-03-03 ENCOUNTER — Other Ambulatory Visit: Payer: Self-pay | Admitting: Internal Medicine

## 2021-03-03 ENCOUNTER — Other Ambulatory Visit: Payer: Self-pay

## 2021-03-03 ENCOUNTER — Ambulatory Visit: Payer: Medicare PPO | Admitting: Internal Medicine

## 2021-03-03 ENCOUNTER — Encounter: Payer: Self-pay | Admitting: Internal Medicine

## 2021-03-03 VITALS — BP 120/74 | HR 96 | Ht 65.0 in | Wt 135.2 lb

## 2021-03-03 DIAGNOSIS — R42 Dizziness and giddiness: Secondary | ICD-10-CM | POA: Diagnosis not present

## 2021-03-03 DIAGNOSIS — R319 Hematuria, unspecified: Secondary | ICD-10-CM

## 2021-03-03 NOTE — Patient Instructions (Signed)
Medication Instructions:  No changes *If you need a refill on your cardiac medications before your next appointment, please call your pharmacy*   Lab Work: none   Testing/Procedures: Your physician has requested that you have a carotid duplex. This test is an ultrasound of the carotid arteries in your neck. It looks at blood flow through these arteries that supply the brain with blood. Allow one hour for this exam. There are no restrictions or special instructions.   Follow-Up: Follow up with your physician will depend on test results.

## 2021-03-06 ENCOUNTER — Ambulatory Visit (HOSPITAL_COMMUNITY)
Admission: RE | Admit: 2021-03-06 | Discharge: 2021-03-06 | Disposition: A | Discharge status: Home / Self Care | Payer: Medicare PPO | Source: Ambulatory Visit | Attending: Cardiovascular Disease | Admitting: Cardiovascular Disease

## 2021-03-06 ENCOUNTER — Other Ambulatory Visit: Payer: Self-pay

## 2021-03-06 DIAGNOSIS — R42 Dizziness and giddiness: Secondary | ICD-10-CM

## 2021-03-11 ENCOUNTER — Telehealth: Payer: Self-pay | Admitting: *Deleted

## 2021-03-11 DIAGNOSIS — I6523 Occlusion and stenosis of bilateral carotid arteries: Secondary | ICD-10-CM

## 2021-03-11 NOTE — Telephone Encounter (Signed)
-----   Message from Pricilla Riffle, MD sent at 03/10/2021 11:11 PM EDT ----- Carotid ultrasound shows minimal plaquing of carotid artiers Also small thyroid nodule.  It does not appear to be suspicious 1.   Would forward USN results to Dr Link Snuffer for review 2.   With minimal plaquing noted again (like aorta) she should be on a statin to lower LDL     I would recomm she take Crestor 10 mg   F/U lipids in 8 wks with liver function

## 2021-03-11 NOTE — Telephone Encounter (Signed)
Pt states that she will call back to office in regards to starting Crestor.

## 2021-03-12 NOTE — Telephone Encounter (Signed)
Pt called back and states that she would like to talk to Dr. Tenny Craw regarding starting Crestor.

## 2021-03-13 NOTE — Telephone Encounter (Signed)
Spoke to pt    She is reluctant to start Crestor because of current GI problems     WOuld like to get lipidps checked again this winter   Diet changing   Recommend   Lipomed with apoB and LPa in January

## 2021-03-16 NOTE — Telephone Encounter (Signed)
Lab orders placed for January.  Message to pt to call office to schedule this lab appointment. Recall for lab work placed.

## 2021-03-16 NOTE — Addendum Note (Signed)
Addended by: Lendon Ka on: 03/16/2021 05:03 PM   Modules accepted: Orders

## 2021-03-30 ENCOUNTER — Ambulatory Visit: Payer: Medicare PPO | Admitting: Internal Medicine

## 2021-04-28 ENCOUNTER — Ambulatory Visit: Payer: Medicare PPO | Admitting: Internal Medicine

## 2021-04-28 ENCOUNTER — Encounter: Payer: Self-pay | Admitting: Internal Medicine

## 2021-04-28 VITALS — BP 90/60 | HR 91 | Ht 65.0 in | Wt 136.0 lb

## 2021-04-28 DIAGNOSIS — R1013 Epigastric pain: Secondary | ICD-10-CM | POA: Diagnosis not present

## 2021-04-28 MED ORDER — PANTOPRAZOLE SODIUM 40 MG PO TBEC: 40.0000 mg | DELAYED_RELEASE_TABLET | ORAL | 1 refills | Status: DC | PRN

## 2021-04-28 NOTE — Patient Instructions (Signed)
If you are age 74 or older, your body mass index should be between 23-30. Your Body mass index is 22.63 kg/m. If this is out of the aforementioned range listed, please consider follow up with your Primary Care Provider.  If you are age 45 or younger, your body mass index should be between 19-25. Your Body mass index is 22.63 kg/m. If this is out of the aformentioned range listed, please consider follow up with your Primary Care Provider.   __________________________________________________________  The [facility] providers would like to encourage you to use [facility] to communicate with providers for non-urgent requests or questions.  Due to long hold times on the telephone, sending your provider a message by [facility] may be a faster and more efficient way to get a response.  Please allow 48 business hours for a response.  Please remember that this is for non-urgent requests.   Follow up with Dr Leone Payor as needed.   I appreciate the opportunity to care for you. Stan Head, MD, [facility]

## 2021-04-28 NOTE — Progress Notes (Signed)
Alexa Rios 75 y.o. Oct 04, 1945 [id number]  Assessment & Plan:   Encounter Diagnosis  Name Primary?   Dyspepsia Yes    She will continue as needed pantoprazole, also famotidine may be used and Gaviscon as needed.  She will watch what she eats to try to reduce triggers.  She will return as needed.  I appreciate the opportunity to care for this patient. CC: [name], MD   Subjective:   Chief Complaint: Follow-up of dyspepsia  HPI Alexa Rios is a 75 year old woman with a long history of functional dyspepsia and some dysphagia issues who says she was doing quite well recently until she went out to lunch or dinner.  She special ordered plain Posta and grilled shrimp.  However she thinks that triggered a flare of abdominal distress with indigestion type symptoms that has persisted and she has started some pantoprazole again and it is calming down.  She goes on to report that the right lower quadrant/groin pain she was having is gone, she had blood in her urine and had a CT scan that was negative for stones and was referred to urology.  Though she had known she had vaginal atrophy she had kind of fallen off on using her estrogen cream and some of the other medication she had been using might of contributed to the dryness.  After using the cream more regularly she has been able to taper that down some to 3 days a week and her right groin pain is gone.  She is very appreciative of Dr. Liliane Shi for that help.  She also talks about having years of headache issues and is considering a referral to [facility] through Dr. Link Snuffer to see a neurologist to help sort these out.  There is also a history of multiple issues with trigeminal neuralgia and other neuralgias as well.   Allergies  Allergen Reactions   Nexium [Esomeprazole Magnesium] Other (See Comments)    Headaches and ? Abdominal pain   Sulfamethoxazole-Trimethoprim    Sumatriptan     palpitations   Carafate [Sucralfate]  Palpitations   Sulfur Dioxide Palpitations   Current Meds  Medication Sig   BIOTIN 5000 PO Take 1 tablet by mouth in the morning and at bedtime.   CALCIUM PO Take 1,200 mg by mouth daily.   Estradiol 1 MG/GM GEL Apply 1mg  twice a week as needed (Patient taking differently: Apply 1 application topically once a week. Apply twice weekly on Wednesday and Sundays)   EVENING PRIMROSE OIL PO Take 500 mg by mouth daily.   gabapentin (NEURONTIN) 100 MG capsule Take 1 capsule by mouth as needed.   Glucosamine-Chondroitin (COSAMIN DS PO) Take 1 tablet by mouth daily.   levothyroxine (SYNTHROID) 25 MCG tablet Take 1 tablet (25 mcg total) by mouth daily before breakfast.   Multiple Vitamin (MULTIVITAMIN ADULT PO) Take by mouth.   polyethylene glycol (MIRALAX / GLYCOLAX) 17 g packet Take 17 g by mouth 2 (two) times daily.   Potassium 99 MG TABS Take 1 tablet by mouth daily.   PREVIDENT 5000 BOOSTER PLUS 1.1 % PSTE Place onto teeth.   Ubrogepant (UBRELVY) 50 MG TABS Take 50 mg by mouth once as needed for up to 1 dose. (Patient taking differently: Take 50 mg by mouth as needed.)   Past Medical History:  Diagnosis Date   Allergy    Candida esophagitis (HCC)    Colon polyps    Diverticulitis    GERD (gastroesophageal reflux disease)    History  of chicken pox    Hypothyroidism    Mesenteric adenitis    Migraine    Paget's disease of bony pelvis    Status post dilation of esophageal narrowing    Thyroid disease    Past Surgical History:  Procedure Laterality Date   BREAST BIOPSY Left 1976   benign per patient   BREAST CYST ASPIRATION Right    30 + yrs ago   CHOLECYSTECTOMY  2015   COLONOSCOPY     KIDNEY CYST REMOVAL     REDUCTION MAMMAPLASTY Bilateral 2005   TONSILLECTOMY AND ADENOIDECTOMY  1951   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   Moved to [place] from [place]  [place] in May 2021   No alcohol   1 caffeinated beverage daily   Never smoker no drugs no other tobacco   family history includes Arthritis in her sister; Cancer in her brother; Diabetes in her brother and father; Early death in her father; Heart disease in her maternal grandmother and paternal grandmother; High Cholesterol in her maternal grandmother and paternal grandmother; Hyperlipidemia in her brother, father, mother, and sister; Lymphoma in her father; Prostate cancer in her paternal grandfather; Rheum arthritis in her mother; Stroke in her mother and paternal grandfather.   Review of Systems As above  Objective:   Physical Exam BP 90/60   Pulse 91   Ht 5\' 5"  (1.651 m)   Wt 136 lb (61.7 kg)   BMI 22.63 kg/m   22 minutes total time spent before seeing the patient with the patient and post patient documentation.

## 2021-05-05 ENCOUNTER — Other Ambulatory Visit: Payer: Self-pay | Admitting: Internal Medicine

## 2021-05-05 DIAGNOSIS — Z1231 Encounter for screening mammogram for malignant neoplasm of breast: Secondary | ICD-10-CM

## 2021-05-19 ENCOUNTER — Other Ambulatory Visit: Payer: Self-pay | Admitting: Family Medicine

## 2021-05-19 DIAGNOSIS — J329 Chronic sinusitis, unspecified: Secondary | ICD-10-CM

## 2021-05-23 ENCOUNTER — Ambulatory Visit
Admission: RE | Admit: 2021-05-23 | Discharge: 2021-05-23 | Disposition: A | Discharge status: Home / Self Care | Payer: Medicare PPO | Source: Ambulatory Visit | Attending: Family Medicine | Admitting: Family Medicine

## 2021-05-23 DIAGNOSIS — J329 Chronic sinusitis, unspecified: Secondary | ICD-10-CM | POA: Diagnosis not present

## 2021-05-23 DIAGNOSIS — J342 Deviated nasal septum: Secondary | ICD-10-CM | POA: Diagnosis not present

## 2021-05-23 DIAGNOSIS — Z87828 Personal history of other (healed) physical injury and trauma: Secondary | ICD-10-CM | POA: Diagnosis not present

## 2021-05-23 DIAGNOSIS — R040 Epistaxis: Secondary | ICD-10-CM | POA: Diagnosis not present

## 2021-05-29 ENCOUNTER — Ambulatory Visit
Admission: RE | Admit: 2021-05-29 | Discharge: 2021-05-29 | Disposition: A | Discharge status: Home / Self Care | Payer: Medicare PPO | Source: Ambulatory Visit | Attending: Internal Medicine | Admitting: Internal Medicine

## 2021-05-29 ENCOUNTER — Other Ambulatory Visit: Payer: Self-pay

## 2021-05-29 DIAGNOSIS — Z1231 Encounter for screening mammogram for malignant neoplasm of breast: Secondary | ICD-10-CM | POA: Diagnosis not present

## 2021-06-04 DIAGNOSIS — H9201 Otalgia, right ear: Secondary | ICD-10-CM | POA: Diagnosis not present

## 2021-06-04 DIAGNOSIS — H65191 Other acute nonsuppurative otitis media, right ear: Secondary | ICD-10-CM | POA: Diagnosis not present

## 2021-06-17 DIAGNOSIS — G43109 Migraine with aura, not intractable, without status migrainosus: Secondary | ICD-10-CM | POA: Diagnosis not present

## 2021-06-17 DIAGNOSIS — M5481 Occipital neuralgia: Secondary | ICD-10-CM | POA: Diagnosis not present

## 2021-06-17 DIAGNOSIS — G500 Trigeminal neuralgia: Secondary | ICD-10-CM | POA: Diagnosis not present

## 2021-06-18 DIAGNOSIS — L821 Other seborrheic keratosis: Secondary | ICD-10-CM | POA: Diagnosis not present

## 2021-06-18 DIAGNOSIS — L570 Actinic keratosis: Secondary | ICD-10-CM | POA: Diagnosis not present

## 2021-06-18 DIAGNOSIS — D225 Melanocytic nevi of trunk: Secondary | ICD-10-CM | POA: Diagnosis not present

## 2021-06-18 DIAGNOSIS — D692 Other nonthrombocytopenic purpura: Secondary | ICD-10-CM | POA: Diagnosis not present

## 2021-06-18 DIAGNOSIS — L918 Other hypertrophic disorders of the skin: Secondary | ICD-10-CM | POA: Diagnosis not present

## 2021-06-19 DIAGNOSIS — J343 Hypertrophy of nasal turbinates: Secondary | ICD-10-CM | POA: Diagnosis not present

## 2021-06-19 DIAGNOSIS — J310 Chronic rhinitis: Secondary | ICD-10-CM | POA: Diagnosis not present

## 2021-06-19 DIAGNOSIS — J342 Deviated nasal septum: Secondary | ICD-10-CM | POA: Diagnosis not present

## 2021-07-06 DIAGNOSIS — M5459 Other low back pain: Secondary | ICD-10-CM | POA: Diagnosis not present

## 2021-07-06 DIAGNOSIS — F419 Anxiety disorder, unspecified: Secondary | ICD-10-CM | POA: Diagnosis not present

## 2021-07-06 DIAGNOSIS — M5416 Radiculopathy, lumbar region: Secondary | ICD-10-CM | POA: Diagnosis not present

## 2021-07-06 DIAGNOSIS — M542 Cervicalgia: Secondary | ICD-10-CM | POA: Diagnosis not present

## 2021-07-22 DIAGNOSIS — M5459 Other low back pain: Secondary | ICD-10-CM | POA: Diagnosis not present

## 2021-08-07 ENCOUNTER — Ambulatory Visit: Payer: Medicare PPO | Admitting: Podiatry

## 2021-08-07 ENCOUNTER — Other Ambulatory Visit: Payer: Self-pay

## 2021-08-07 DIAGNOSIS — M79675 Pain in left toe(s): Secondary | ICD-10-CM | POA: Diagnosis not present

## 2021-08-07 DIAGNOSIS — L600 Ingrowing nail: Secondary | ICD-10-CM | POA: Diagnosis not present

## 2021-08-07 NOTE — Patient Instructions (Signed)

## 2021-08-11 DIAGNOSIS — M5416 Radiculopathy, lumbar region: Secondary | ICD-10-CM | POA: Diagnosis not present

## 2021-08-11 DIAGNOSIS — M5136 Other intervertebral disc degeneration, lumbar region: Secondary | ICD-10-CM | POA: Diagnosis not present

## 2021-08-11 NOTE — Progress Notes (Signed)
Subjective:   Patient ID: Alexa Rios, female   DOB: 76 y.o.   MRN: [id number]   HPI 76 year old female presents the office today for concerns of ingrown toenails to both of her big toenails of the left side worse than the right.  History about 10 years ago has been intermittent but seems to be getting worse.  She does get pedicures to get them trimmed out but is becoming more tender.  She has had a history of partial nail avulsion previously.  She did receive the left side today if needed to get the corners removed as it is tender.  No swelling redness or any drainage at this time.  No other concerns.   Review of Systems  All other systems reviewed and are negative.  Past Medical History:  Diagnosis Date   Allergy    Candida esophagitis (HCC)    Colon polyps    Diverticulitis    GERD (gastroesophageal reflux disease)    History of chicken pox    Hypothyroidism    Mesenteric adenitis    Migraine    Paget's disease of bony pelvis    Status post dilation of esophageal narrowing    Thyroid disease     Past Surgical History:  Procedure Laterality Date   BREAST BIOPSY Left 1976   benign per patient   BREAST CYST ASPIRATION Right    30 + yrs ago   CHOLECYSTECTOMY  2015   COLONOSCOPY     KIDNEY CYST REMOVAL     REDUCTION MAMMAPLASTY Bilateral 2005   TONSILLECTOMY AND ADENOIDECTOMY  1951   VAGINAL HYSTERECTOMY  2000   benign path     Current Outpatient Medications:    BIOTIN 5000 PO, Take 1 tablet by mouth in the morning and at bedtime., Disp: , Rfl:    CALCIUM PO, Take 1,200 mg by mouth daily., Disp: , Rfl:    Estradiol 1 MG/GM GEL, Apply 1mg  twice a week as needed (Patient taking differently: Apply 1 application topically once a week. Apply twice weekly on Wednesday and Sundays), Disp: 1 g, Rfl: 0   EVENING PRIMROSE OIL PO, Take 500 mg by mouth daily., Disp: , Rfl:    gabapentin (NEURONTIN) 100 MG capsule, Take 1 capsule by mouth as needed., Disp: , Rfl:     Glucosamine-Chondroitin (COSAMIN DS PO), Take 1 tablet by mouth daily., Disp: , Rfl:    levothyroxine (SYNTHROID) 25 MCG tablet, Take 1 tablet (25 mcg total) by mouth daily before breakfast., Disp: 30 tablet, Rfl: 1   Multiple Vitamin (MULTIVITAMIN ADULT PO), Take by mouth., Disp: , Rfl:    pantoprazole (PROTONIX) 40 MG tablet, Take 1 tablet (40 mg total) by mouth as needed., Disp: 90 tablet, Rfl: 1   polyethylene glycol (MIRALAX / GLYCOLAX) 17 g packet, Take 17 g by mouth 2 (two) times daily., Disp: , Rfl:    Potassium 99 MG TABS, Take 1 tablet by mouth daily., Disp: , Rfl:    PREVIDENT 5000 BOOSTER PLUS 1.1 % PSTE, Place onto teeth., Disp: , Rfl:    Ubrogepant (UBRELVY) 50 MG TABS, Take 50 mg by mouth once as needed for up to 1 dose. (Patient taking differently: Take 50 mg by mouth as needed.), Disp: 30 tablet, Rfl: 2  Allergies  Allergen Reactions   Nexium [Esomeprazole Magnesium] Other (See Comments)    Headaches and ? Abdominal pain   Sulfamethoxazole-Trimethoprim    Sumatriptan     palpitations   Carafate [Sucralfate] Palpitations  Sulfur Dioxide Palpitations          Objective:  Physical Exam  General: AAO x3, NAD  Dermatological: Incurvation present both medial lateral aspects of bilateral hallux toenails with tenderness palpation of the nail borders.  There is no drainage or pus.  Minimal edema.  No ascending cellulitis.  No open lesions.  Vascular: Dorsalis Pedis artery and Posterior Tibial artery pedal pulses are 2/4 bilateral with immedate capillary fill time. There is no pain with calf compression, swelling, warmth, erythema.   Neruologic: Grossly intact via light touch bilateral.  Musculoskeletal: Tenderness of ingrown toenails but no other areas of discomfort.  Left side worse than right.  Muscular strength 5/5 in all groups tested bilateral.  Gait: Unassisted, Nonantalgic.       Assessment:   Ingrown toenail bilateral hallux     Plan:  -Treatment options  discussed including all alternatives, risks, and complications -Etiology of symptoms were discussed -At this time, the patient is requesting partial nail removal with chemical matricectomy to the symptomatic portion of the nail. Risks and complications were discussed with the patient for which they understand and written consent was obtained. Under sterile conditions a total of 3 mL of a mixture of 2% lidocaine plain and 0.5% Marcaine plain was infiltrated in a hallux block fashion.  An additional 1.5 cc was infiltrated to ensure anesthesia.  Once anesthetized, the skin was prepped in sterile fashion. A tourniquet was then applied. Next the medial, lateral aspect of the left hallux nail border was then sharply excised making sure to remove the entire offending nail border. Once the nails were ensured to be removed area was debrided and the underlying skin was intact. There is no purulence identified in the procedure. Next phenol was then applied under standard conditions and copiously irrigated.  Betadine ointment was applied. A dry sterile dressing was applied. After application of the dressing the tourniquet was removed and there is found to be an immediate capillary refill time to the digit. The patient tolerated the procedure well any complications. Post procedure instructions were discussed the patient for which he verbally understood. Follow-up in one week for nail check or sooner if any problems are to arise. Discussed signs/symptoms of infection and directed to call the office immediately should any occur or go directly to the emergency room. In the meantime, encouraged to call the office with any questions, concerns, changes symptoms. -Possible AP nail next appointment on the right side.  Vivi Barrack DPM

## 2021-08-13 ENCOUNTER — Other Ambulatory Visit: Payer: Medicare PPO

## 2021-08-13 ENCOUNTER — Other Ambulatory Visit: Payer: Self-pay

## 2021-08-13 DIAGNOSIS — I6523 Occlusion and stenosis of bilateral carotid arteries: Secondary | ICD-10-CM

## 2021-08-15 LAB — NMR, LIPOPROFILE
Cholesterol, Total: 189 mg/dL (ref 100–199)
HDL Particle Number: 40.9 umol/L (ref >=30.5)
HDL-C: 78 mg/dL (ref >39)
LDL Particle Number: 963 nmol/L (ref <1000)
LDL Size: 20.8 nm (ref >20.5)
LDL-C (NIH Calc): 98 mg/dL (ref 0–99)
LP-IR Score: 34 (ref <=45)
Small LDL Particle Number: 384 nmol/L (ref <=527)
Triglycerides: 68 mg/dL (ref 0–149)

## 2021-08-15 LAB — APOLIPOPROTEIN B: Apolipoprotein B: 81 mg/dL (ref <90)

## 2021-08-15 LAB — LIPOPROTEIN A (LPA): Lipoprotein (a): 28.2 nmol/L (ref <75.0)

## 2021-08-18 DIAGNOSIS — B027 Disseminated zoster: Secondary | ICD-10-CM | POA: Diagnosis not present

## 2021-08-18 DIAGNOSIS — G500 Trigeminal neuralgia: Secondary | ICD-10-CM | POA: Diagnosis not present

## 2021-08-20 ENCOUNTER — Telehealth: Payer: Self-pay

## 2021-08-20 NOTE — Telephone Encounter (Signed)
I spoke with the pt and she reports that she is not ready to start a statin... she has known may people including her husband that has had side effects from taking them and with her history of Fibromyalgia, GI upset... she does not want to take them but she has tremendous plans to continue eating healthy and bringing her LDL down as much as she can without medications... she is having Lipids drawn with Dr. Ardeth Perfect August 2023 and will have those labs sent to [place] for Dr. [facility] Challenger for her review.   She will call back if she has any further questions.

## 2021-08-20 NOTE — Telephone Encounter (Signed)
-----   Message from Pricilla Riffle, MD sent at 08/17/2021  7:35 AM EST ----- Patients lipid numbers are overall not bad   LDL 98  Her ApoB and Lpa are not elevated  BUt, with mild plaquing noted on the aorta I would recomm lowering some  Would recomm a low dose of Crestor 5 mg to stabilize what is there, prevent progression Would recomm 5 mg  Crestor with f/u lipomed in 8 wks with liver panel

## 2021-08-21 ENCOUNTER — Ambulatory Visit: Payer: Medicare PPO | Admitting: Podiatry

## 2021-08-21 ENCOUNTER — Other Ambulatory Visit: Payer: Self-pay

## 2021-08-21 DIAGNOSIS — M79674 Pain in right toe(s): Secondary | ICD-10-CM

## 2021-08-21 DIAGNOSIS — L600 Ingrowing nail: Secondary | ICD-10-CM

## 2021-08-21 NOTE — Patient Instructions (Signed)

## 2021-08-24 NOTE — Progress Notes (Signed)
Subjective: 76 year old female presents the office today for further evaluation after undergoing partial nail avulsion of the left hallux.  She states that the toenail is healing well.  She feels that because the second toe sits on top of the big toe has been putting pressure which aggravates the procedure site some but otherwise has been doing well.  She was proceed with having the right toenail corners removed today.  Specific orders to get tender particularly with pressure in which she has.  Currently denies any drainage or possibly swelling.  She has no other concerns today.  Objective: AAO x3, NAD DP/PT pulses palpable bilaterally, CRT less than 3 seconds Status post partial nail avulsion of the left hallux on both the medial and lateral nail borders with some amount of scabbing present.  There is no drainage or pus identified.  No significant tenderness.  On the right hallux with incurvation present to both the medial and lateral nail borders without any drainage or possibly erythema.  Slight edema on nail borders.  No other areas of discomfort noted today. No pain with calf compression, swelling, warmth, erythema  Assessment: Right hallux ingrown toenail, status post left partial nail avulsions  Plan: -All treatment options discussed with the patient including all alternatives, risks, complications.  -For the left continues to be Epsom salts covered with a small amount of antibiotic ointment and a bandage and to debride the area open at nighttime.  Monitor for any signs or symptoms or recurrence. -At this time, the patient is requesting partial nail removal with chemical matricectomy to the symptomatic portion of the nail. Risks and complications were discussed with the patient for which they understand and written consent was obtained. Under sterile conditions a total of 3 mL of a mixture of 2% lidocaine plain and 0.5% Marcaine plain was infiltrated in a hallux block fashion. Once  anesthetized, the skin was prepped in sterile fashion. A tourniquet was then applied. Next the medial and lateral aspect of hallux nail border was then sharply excised making sure to remove the entire offending nail border. Once the nails were ensured to be removed area was debrided and the underlying skin was intact. There is no purulence identified in the procedure. Next phenol was then applied under standard conditions and copiously irrigated. Silvadene was applied. A dry sterile dressing was applied. After application of the dressing the tourniquet was removed and there is found to be an immediate capillary refill time to the digit. The patient tolerated the procedure well any complications. Post procedure instructions were discussed the patient for which he verbally understood. Follow-up in one week for nail check or sooner if any problems are to arise. Discussed signs/symptoms of infection and directed to call the office immediately should any occur or go directly to the emergency room. In the meantime, encouraged to call the office with any questions, concerns, changes symptoms. -Patient encouraged to call the office with any questions, concerns, change in symptoms.   Return in about 2 weeks (around 09/04/2021), or if symptoms worsen or fail to improve, for nail check .  Vivi Barrack DPM

## 2021-08-25 ENCOUNTER — Other Ambulatory Visit: Payer: Self-pay | Admitting: Podiatry

## 2021-08-25 ENCOUNTER — Telehealth: Payer: Self-pay | Admitting: Podiatry

## 2021-08-25 MED ORDER — CEPHALEXIN 500 MG PO CAPS: 500.0000 mg | ORAL_CAPSULE | Freq: Three times a day (TID) | ORAL | 0 refills | Status: DC

## 2021-08-25 NOTE — Telephone Encounter (Signed)
Patient called at the base of the big toe , she is having pain and redness, this just started 3-4 days ago.  She thinks maybe a piece of the nail was left in there ? She wants to speak to a nurse I have her scheduled for 08/27/21 1045a

## 2021-08-27 ENCOUNTER — Other Ambulatory Visit: Payer: Self-pay

## 2021-08-27 ENCOUNTER — Ambulatory Visit: Payer: Medicare PPO | Admitting: Podiatry

## 2021-08-27 DIAGNOSIS — L600 Ingrowing nail: Secondary | ICD-10-CM

## 2021-08-27 NOTE — Patient Instructions (Signed)

## 2021-08-31 DIAGNOSIS — L600 Ingrowing nail: Secondary | ICD-10-CM | POA: Insufficient documentation

## 2021-08-31 NOTE — Progress Notes (Signed)
Subjective: 76 year old female presents the office today for evaluation after undergoing partial nail avulsions bilaterally.  Right has been healing well but she feels on the left side, lateral aspect there may be a small amount of nail coming back in.  She did start the Keflex as she called the office it was slightly red and irritated.  That has helped some.  No drainage or pus.  Also the second toe sits on top of the hallux which may be aggravating her symptoms she states.  Objective: AAO x3, NAD DP/PT pulses palpable bilaterally, CRT less than 3 seconds Status post partial nail avulsions.  There is no evidence of remaining toenail.  There is slight edema and faint erythema but there is no ascending cellulitis.  There is no drainage or possibly signs of infection. Second digit does sit over The hallux left side worse than right No pain with calf compression, swelling, warmth, erythema  Assessment: Ingrown toenail left hallux  Plan: -All treatment options discussed with the patient including all alternatives, risks, complications.  -At this point discussed going back into see if there is any residual nail, able to visualize this today.  Decided to hold off on this.  Continue soaking in Epsom salts and finished course of antibiotics.  Monitor for any signs or symptoms of infection.  Continue offloading as well. -Patient encouraged to call the office with any questions, concerns, change in symptoms.   Vivi Barrack DPM

## 2021-09-01 DIAGNOSIS — M5416 Radiculopathy, lumbar region: Secondary | ICD-10-CM | POA: Diagnosis not present

## 2021-09-16 DIAGNOSIS — M5416 Radiculopathy, lumbar region: Secondary | ICD-10-CM | POA: Diagnosis not present

## 2021-09-18 DIAGNOSIS — M7051 Other bursitis of knee, right knee: Secondary | ICD-10-CM | POA: Diagnosis not present

## 2021-09-18 DIAGNOSIS — M25561 Pain in right knee: Secondary | ICD-10-CM | POA: Diagnosis not present

## 2021-10-05 ENCOUNTER — Encounter: Payer: Self-pay | Admitting: Internal Medicine

## 2021-10-05 ENCOUNTER — Other Ambulatory Visit: Payer: Self-pay | Admitting: Internal Medicine

## 2021-10-05 MED ORDER — HYOSCYAMINE SULFATE 0.125 MG SL SUBL: 0.1250 mg | SUBLINGUAL_TABLET | SUBLINGUAL | 1 refills | Status: DC | PRN

## 2021-10-11 ENCOUNTER — Encounter: Payer: Self-pay | Admitting: Internal Medicine

## 2021-10-12 ENCOUNTER — Encounter: Payer: Self-pay | Admitting: Internal Medicine

## 2021-10-12 ENCOUNTER — Telehealth: Payer: Self-pay

## 2021-10-12 ENCOUNTER — Ambulatory Visit: Payer: Medicare PPO | Admitting: Internal Medicine

## 2021-10-12 VITALS — BP 114/76 | HR 80 | Ht 65.0 in | Wt 141.0 lb

## 2021-10-12 DIAGNOSIS — R1310 Dysphagia, unspecified: Secondary | ICD-10-CM | POA: Diagnosis not present

## 2021-10-12 DIAGNOSIS — K121 Other forms of stomatitis: Secondary | ICD-10-CM

## 2021-10-12 DIAGNOSIS — J029 Acute pharyngitis, unspecified: Secondary | ICD-10-CM | POA: Diagnosis not present

## 2021-10-12 DIAGNOSIS — K5792 Diverticulitis of intestine, part unspecified, without perforation or abscess without bleeding: Secondary | ICD-10-CM | POA: Diagnosis not present

## 2021-10-12 MED ORDER — LIDOCAINE VISCOUS HCL 2 % MT SOLN: 15.0000 mL | OROMUCOSAL | 1 refills | Status: DC | PRN

## 2021-10-12 NOTE — Telephone Encounter (Signed)
Pt was scheduled for today 10/12/2021 at 2:50 PM for an appointment to see D. Leone Payor ?Pt made aware: ?Pt verbalized understanding with all questions answered.  ? ?

## 2021-10-12 NOTE — Patient Instructions (Signed)
We have sent the following medications to your pharmacy for you to pick up at your convenience: ?Lidocaine (use as needed) ? ?Get over the counter Mucinex to use. ?   ? ?Call Dr Suszanne Conners for an appointment. ? ?Stay on your PPI and finish the Augmentin you started over the weekend. ? ? ?I appreciate the opportunity to care for you. ?Stan Head, MD, [facility]  ?

## 2021-10-12 NOTE — Progress Notes (Signed)
? ?Alexa Rios 75 y.o. 11/07/1945 [id number] ? ?Assessment & Plan:  ? ?Encounter Diagnoses  ?Name Primary?  ? Diverticulitis Yes  ? Mouth ulcers   ? Sore throat   ? Odynophagia   ? ? ?Clinically she may have had recurrent diverticulitis she can finish the antibiotic she has she has enough left to take a week of Augmentin 875 mg twice daily. ? ?Regarding the mouth ulcers, sore throat and odynophagia I do not think this is GI in origin though I suppose LPR is possible she has never really responded to the PPIs well so it seems very unlikely and the location of the ulceration is atypical in my experience i.e. too far forward in the oropharyngeal cavity I have prescribed some viscous Xylocaine for symptomatic relief and suggest that she take Mucinex.  I have also suggested she contact Dr. Suszanne Rios for an evaluation. ? ? ?CC: Alexa Penna, MD ? ? ? ? ?Subjective:  ? ?Chief Complaint: Left lower quadrant pain and sore throat ? ?HPI ?Alexa Rios is a 76 year old woman who has had functional dyspepsia and odynophagia problems as well as a recurrent diverticulitis issue who presents for follow-up with recurrent symptoms.  She had messaged me last week with some left lower quadrant pain and I prescribed hyoscyamine.  It worsened and became intense and reminded her of diverticulitis that she had had last year so she had some Augmentin 875 mg left over from sinusitis treatment that was not necessary and started it and has taken that over the past 2 days and feels like the pain is a little bit better.  Bowel movements were "explosive but small" and they are starting to move towards normal as well. ? ?Additionally she has "a lot of mucus production that is very thick in my mouth and throat", and also a very sore throat with painful swallowing/odynophagia and some mouth ulcers that have developed.  This is similar to problems that she has had off and on that have defied explanation with multiple upper endoscopy  procedures.  No fever or chills.  No cough no other upper respiratory symptoms.  She has seen Dr. Jodean Rios of ENT last year.  She had a negative CT sinus study last year except for a mastoid effusion on the right.  She has not tried anything to relieve the mucus issue, she did start PPI several weeks ago she has been on and off these over time. ? ? ? ?CT abdomen pelvis with contrast January 2022 with diverticulitis of the sigmoid colon and a suspected abscess ?February 2022 CT resolution of abscess in early February and in late February continued improvement in diverticulitis ? ?CT abdomen pelvis July 2022, August 2022 twice all without signs of diverticulitis. ? ?EGD 11/21/2020 normal ? ?Colonoscopy 11/21/2020 with with sigmoid diverticulosis and colonic lumen narrowing ?Wt Readings from Last 3 Encounters:  ?10/12/21 141 lb (64 kg)  ?04/28/21 136 lb (61.7 kg)  ?03/03/21 135 lb 3.2 oz (61.3 kg)  ? ? ?Allergies  ?Allergen Reactions  ? Nexium [Esomeprazole Magnesium] Other (See Comments)  ?  Headaches and ? Abdominal pain  ? Sulfamethoxazole-Trimethoprim   ? Sumatriptan   ?  palpitations  ? Carafate [Sucralfate] Palpitations  ? Sulfur Dioxide Palpitations  ? ?Current Meds  ?Medication Sig  ? BIOTIN 5000 PO Take 1 tablet by mouth in the morning and at bedtime.  ? CALCIUM PO Take 1,200 mg by mouth daily.  ? Estradiol 1 MG/GM GEL Apply 1mg  twice a week as  needed (Patient taking differently: Apply 1 application. topically once a week. Apply twice weekly on Wednesday and Sundays)  ? EVENING PRIMROSE OIL PO Take 500 mg by mouth daily.  ? gabapentin (NEURONTIN) 100 MG capsule Take 1 capsule by mouth as needed.  ? Glucosamine-Chondroitin (COSAMIN DS PO) Take 1 tablet by mouth daily.  ? hyoscyamine (LEVSIN SL) 0.125 MG SL tablet Place 1 tablet (0.125 mg total) under the tongue every 4 (four) hours as needed.  ? levothyroxine (SYNTHROID) 25 MCG tablet Take 1 tablet (25 mcg total) by mouth daily before breakfast.  ? Multiple Vitamin  (MULTIVITAMIN ADULT PO) Take by mouth.  ? pantoprazole (PROTONIX) 40 MG tablet Take 1 tablet (40 mg total) by mouth as needed.  ? polyethylene glycol (MIRALAX / GLYCOLAX) 17 g packet Take 17 g by mouth 2 (two) times daily.  ? Potassium 99 MG TABS Take 1 tablet by mouth daily.  ? PREVIDENT 5000 BOOSTER PLUS 1.1 % PSTE Place onto teeth.  ? Ubrogepant (UBRELVY) 50 MG TABS Take 50 mg by mouth once as needed for up to 1 dose. (Patient taking differently: Take 50 mg by mouth as needed.)  ? ?Past Medical History:  ?Diagnosis Date  ? Allergy   ? Candida esophagitis (HCC)   ? Colon polyps   ? Diverticulitis   ? GERD (gastroesophageal reflux disease)   ? History of chicken pox   ? Hypothyroidism   ? Mesenteric adenitis   ? Migraine   ? Paget's disease of bony pelvis   ? Status post dilation of esophageal narrowing   ? Thyroid disease   ? ?Past Surgical History:  ?Procedure Laterality Date  ? BREAST BIOPSY Left 1976  ? benign per patient  ? BREAST CYST ASPIRATION Right   ? 30 + yrs ago  ? CHOLECYSTECTOMY  2015  ? COLONOSCOPY    ? KIDNEY CYST REMOVAL    ? REDUCTION MAMMAPLASTY Bilateral 2005  ? TONSILLECTOMY AND ADENOIDECTOMY  1951  ? VAGINAL HYSTERECTOMY  2000  ? benign path  ? ?Social History  ? ?Social History Narrative  ? 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 Moved to [place] from [place] in May 2021  ? No alcohol  ? 1 caffeinated beverage daily  ? Never smoker no drugs no other tobacco  ? ?family history includes Arthritis in her sister; Cancer in her brother; Diabetes in her brother and father; Early death in her father; Heart disease in her maternal grandmother and paternal grandmother; High Cholesterol in her maternal grandmother and paternal grandmother; Hyperlipidemia in her brother, father, mother, and sister; Lymphoma in her father; Prostate cancer in her paternal grandfather; Rheum arthritis in her mother; Stroke in her mother and paternal grandfather. ? ? ?Review of  Systems ? ?As above ?Objective:  ? Physical Exam ?BP 114/76   Pulse 80   Ht 5\' 5"  (1.651 m)   Wt 141 lb (64 kg)   SpO2 98%   BMI 23.46 kg/m?  ?NAD ?Few hard palate ulcers right and one r lat tongue ulcer w/o other pharyngeal or oral abnormalities ?Abd mild LLQ tenderness w/o rebound issues ? ?

## 2021-11-18 DIAGNOSIS — M5481 Occipital neuralgia: Secondary | ICD-10-CM | POA: Diagnosis not present

## 2021-12-10 DIAGNOSIS — H5713 Ocular pain, bilateral: Secondary | ICD-10-CM | POA: Diagnosis not present

## 2021-12-10 DIAGNOSIS — H10413 Chronic giant papillary conjunctivitis, bilateral: Secondary | ICD-10-CM | POA: Diagnosis not present

## 2022-01-01 DIAGNOSIS — R1032 Left lower quadrant pain: Secondary | ICD-10-CM | POA: Diagnosis not present

## 2022-01-01 DIAGNOSIS — Z8719 Personal history of other diseases of the digestive system: Secondary | ICD-10-CM | POA: Diagnosis not present

## 2022-01-02 DIAGNOSIS — R1032 Left lower quadrant pain: Secondary | ICD-10-CM | POA: Diagnosis not present

## 2022-01-02 DIAGNOSIS — K5790 Diverticulosis of intestine, part unspecified, without perforation or abscess without bleeding: Secondary | ICD-10-CM | POA: Diagnosis not present

## 2022-01-02 DIAGNOSIS — K838 Other specified diseases of biliary tract: Secondary | ICD-10-CM | POA: Diagnosis not present

## 2022-01-02 DIAGNOSIS — R109 Unspecified abdominal pain: Secondary | ICD-10-CM | POA: Diagnosis not present

## 2022-01-02 DIAGNOSIS — K5730 Diverticulosis of large intestine without perforation or abscess without bleeding: Secondary | ICD-10-CM | POA: Diagnosis not present

## 2022-01-02 DIAGNOSIS — K5710 Diverticulosis of small intestine without perforation or abscess without bleeding: Secondary | ICD-10-CM | POA: Diagnosis not present

## 2022-02-02 DIAGNOSIS — R311 Benign essential microscopic hematuria: Secondary | ICD-10-CM | POA: Diagnosis not present

## 2022-02-25 DIAGNOSIS — R7989 Other specified abnormal findings of blood chemistry: Secondary | ICD-10-CM | POA: Diagnosis not present

## 2022-02-25 DIAGNOSIS — E039 Hypothyroidism, unspecified: Secondary | ICD-10-CM | POA: Diagnosis not present

## 2022-02-25 DIAGNOSIS — R5383 Other fatigue: Secondary | ICD-10-CM | POA: Diagnosis not present

## 2022-03-04 DIAGNOSIS — G43919 Migraine, unspecified, intractable, without status migrainosus: Secondary | ICD-10-CM | POA: Diagnosis not present

## 2022-03-04 DIAGNOSIS — M5136 Other intervertebral disc degeneration, lumbar region: Secondary | ICD-10-CM | POA: Diagnosis not present

## 2022-03-04 DIAGNOSIS — Z1389 Encounter for screening for other disorder: Secondary | ICD-10-CM | POA: Diagnosis not present

## 2022-03-04 DIAGNOSIS — Z0000 Encounter for general adult medical examination without abnormal findings: Secondary | ICD-10-CM | POA: Diagnosis not present

## 2022-03-04 DIAGNOSIS — G500 Trigeminal neuralgia: Secondary | ICD-10-CM | POA: Diagnosis not present

## 2022-03-04 DIAGNOSIS — E039 Hypothyroidism, unspecified: Secondary | ICD-10-CM | POA: Diagnosis not present

## 2022-03-04 DIAGNOSIS — Z1331 Encounter for screening for depression: Secondary | ICD-10-CM | POA: Diagnosis not present

## 2022-03-15 ENCOUNTER — Telehealth: Payer: Self-pay | Admitting: Internal Medicine

## 2022-03-15 DIAGNOSIS — Z79899 Other long term (current) drug therapy: Secondary | ICD-10-CM

## 2022-03-15 DIAGNOSIS — I6523 Occlusion and stenosis of bilateral carotid arteries: Secondary | ICD-10-CM

## 2022-03-15 DIAGNOSIS — E785 Hyperlipidemia, unspecified: Secondary | ICD-10-CM

## 2022-03-15 NOTE — Telephone Encounter (Signed)
Follow up lipomed and liver panel in 8 wks

## 2022-03-15 NOTE — Telephone Encounter (Signed)
Receivd labs done at [facility]   LDL 9  HDL 66     SHe had a CT in past that showed mild atherosclerosis of aorta     It is recomm that LDL be close or less than 70    I would recomm a trial of Crestor 10 mg     Goal to prevent more atherosclerosis from developing

## 2022-03-18 DIAGNOSIS — R3129 Other microscopic hematuria: Secondary | ICD-10-CM | POA: Diagnosis not present

## 2022-03-18 DIAGNOSIS — R311 Benign essential microscopic hematuria: Secondary | ICD-10-CM | POA: Diagnosis not present

## 2022-03-23 ENCOUNTER — Other Ambulatory Visit: Payer: Self-pay

## 2022-03-23 MED ORDER — ROSUVASTATIN CALCIUM 10 MG PO TABS: 10.0000 mg | ORAL_TABLET | Freq: Every day | ORAL | 3 refills | Status: DC

## 2022-03-23 NOTE — Telephone Encounter (Signed)
Left a message for the pt to call back or to review my message in My Chart.   Also:   Follow up lipomed and liver panel in 8 wks

## 2022-03-23 NOTE — Addendum Note (Signed)
Addended by: Bertram Millard on: 03/23/2022 10:43 AM   Modules accepted: Orders

## 2022-03-25 ENCOUNTER — Encounter: Payer: Self-pay | Admitting: Internal Medicine

## 2022-03-26 ENCOUNTER — Telehealth: Payer: Self-pay | Admitting: Internal Medicine

## 2022-03-26 ENCOUNTER — Other Ambulatory Visit: Payer: Self-pay | Admitting: Internal Medicine

## 2022-03-26 MED ORDER — PANTOPRAZOLE SODIUM 40 MG PO TBEC: DELAYED_RELEASE_TABLET | ORAL | 0 refills | Status: DC

## 2022-03-26 MED ORDER — PANTOPRAZOLE SODIUM 40 MG PO TBEC: 40.0000 mg | DELAYED_RELEASE_TABLET | ORAL | 1 refills | Status: DC | PRN

## 2022-03-26 NOTE — Telephone Encounter (Signed)
Stephanie from [employer] called states the need maximum dose and frequency on patient's pantoprazole 40 mg medication to be send over. Phone # [phone]. Please call to follow.

## 2022-03-26 NOTE — Telephone Encounter (Signed)
Per Dr Leone Payor patient may take 1 to 2 tablets daily as needed. I called the pharmacy back and gave them this refill over the phone for the pantoprazole 40mg .

## 2022-03-31 NOTE — Telephone Encounter (Signed)
Pt was made aware of Dr. Leone Payor recommendations: Pt stated that she will call back when she has a better understanding of her schedule and she will schedule an office visit: Pt verbalized understanding with all questions answered.

## 2022-04-02 DIAGNOSIS — M5417 Radiculopathy, lumbosacral region: Secondary | ICD-10-CM | POA: Diagnosis not present

## 2022-04-02 DIAGNOSIS — M545 Low back pain, unspecified: Secondary | ICD-10-CM | POA: Diagnosis not present

## 2022-04-02 DIAGNOSIS — G8929 Other chronic pain: Secondary | ICD-10-CM | POA: Diagnosis not present

## 2022-04-02 DIAGNOSIS — K5792 Diverticulitis of intestine, part unspecified, without perforation or abscess without bleeding: Secondary | ICD-10-CM | POA: Diagnosis not present

## 2022-04-09 ENCOUNTER — Telehealth: Payer: Self-pay | Admitting: Internal Medicine

## 2022-04-09 NOTE — Telephone Encounter (Signed)
Patient returned your call. Requesting a call back.

## 2022-04-09 NOTE — Telephone Encounter (Signed)
Left message for pt to call back  °

## 2022-04-09 NOTE — Telephone Encounter (Signed)
Inbound call from patient states Dr.Gessner wanted to see her for an appointment. Offered her his next available 12/1, but she declined and wanted to speak with a nurse to further advise. Thank you

## 2022-04-09 NOTE — Telephone Encounter (Signed)
Pt stated that Dr. Carlean Purl wanted her to schedule an office visit: Chart reviewed:  Pt was scheduled for an office visit with Tye Savoy NP on 05/11/2022 at 3:00 PM: Pt made aware Pt verbalized understanding with all questions answered.

## 2022-04-26 DIAGNOSIS — M5416 Radiculopathy, lumbar region: Secondary | ICD-10-CM | POA: Diagnosis not present

## 2022-04-26 DIAGNOSIS — M5417 Radiculopathy, lumbosacral region: Secondary | ICD-10-CM | POA: Diagnosis not present

## 2022-05-06 DIAGNOSIS — M545 Low back pain, unspecified: Secondary | ICD-10-CM | POA: Diagnosis not present

## 2022-05-06 DIAGNOSIS — M25552 Pain in left hip: Secondary | ICD-10-CM | POA: Diagnosis not present

## 2022-05-11 ENCOUNTER — Ambulatory Visit: Payer: Medicare PPO | Admitting: Nurse Practitioner

## 2022-05-13 DIAGNOSIS — M533 Sacrococcygeal disorders, not elsewhere classified: Secondary | ICD-10-CM | POA: Diagnosis not present

## 2022-05-13 DIAGNOSIS — M5416 Radiculopathy, lumbar region: Secondary | ICD-10-CM | POA: Diagnosis not present

## 2022-05-13 DIAGNOSIS — M545 Low back pain, unspecified: Secondary | ICD-10-CM | POA: Diagnosis not present

## 2022-05-22 DIAGNOSIS — R300 Dysuria: Secondary | ICD-10-CM | POA: Diagnosis not present

## 2022-05-22 DIAGNOSIS — Z887 Allergy status to serum and vaccine status: Secondary | ICD-10-CM | POA: Diagnosis not present

## 2022-05-22 DIAGNOSIS — N760 Acute vaginitis: Secondary | ICD-10-CM | POA: Diagnosis not present

## 2022-05-22 DIAGNOSIS — Z881 Allergy status to other antibiotic agents status: Secondary | ICD-10-CM | POA: Diagnosis not present

## 2022-05-22 DIAGNOSIS — Z888 Allergy status to other drugs, medicaments and biological substances status: Secondary | ICD-10-CM | POA: Diagnosis not present

## 2022-05-22 DIAGNOSIS — Z882 Allergy status to sulfonamides status: Secondary | ICD-10-CM | POA: Diagnosis not present

## 2022-05-28 DIAGNOSIS — M5136 Other intervertebral disc degeneration, lumbar region: Secondary | ICD-10-CM | POA: Diagnosis not present

## 2022-05-28 DIAGNOSIS — M791 Myalgia, unspecified site: Secondary | ICD-10-CM | POA: Diagnosis not present

## 2022-05-28 DIAGNOSIS — M25551 Pain in right hip: Secondary | ICD-10-CM | POA: Diagnosis not present

## 2022-05-28 DIAGNOSIS — R229 Localized swelling, mass and lump, unspecified: Secondary | ICD-10-CM | POA: Diagnosis not present

## 2022-05-28 DIAGNOSIS — M889 Osteitis deformans of unspecified bone: Secondary | ICD-10-CM | POA: Diagnosis not present

## 2022-05-28 DIAGNOSIS — M25552 Pain in left hip: Secondary | ICD-10-CM | POA: Diagnosis not present

## 2022-06-02 DIAGNOSIS — M533 Sacrococcygeal disorders, not elsewhere classified: Secondary | ICD-10-CM | POA: Diagnosis not present

## 2022-06-08 IMAGING — MG DIGITAL SCREENING BILAT W/ TOMO W/ CAD
8 series · 8 of 24 positions shown · non-contrast
Comparison: Previous exam(s).

CLINICAL DATA: Screening.

EXAM:
DIGITAL SCREENING BILATERAL MAMMOGRAM WITH TOMO AND CAD

[L CC synth-2D]
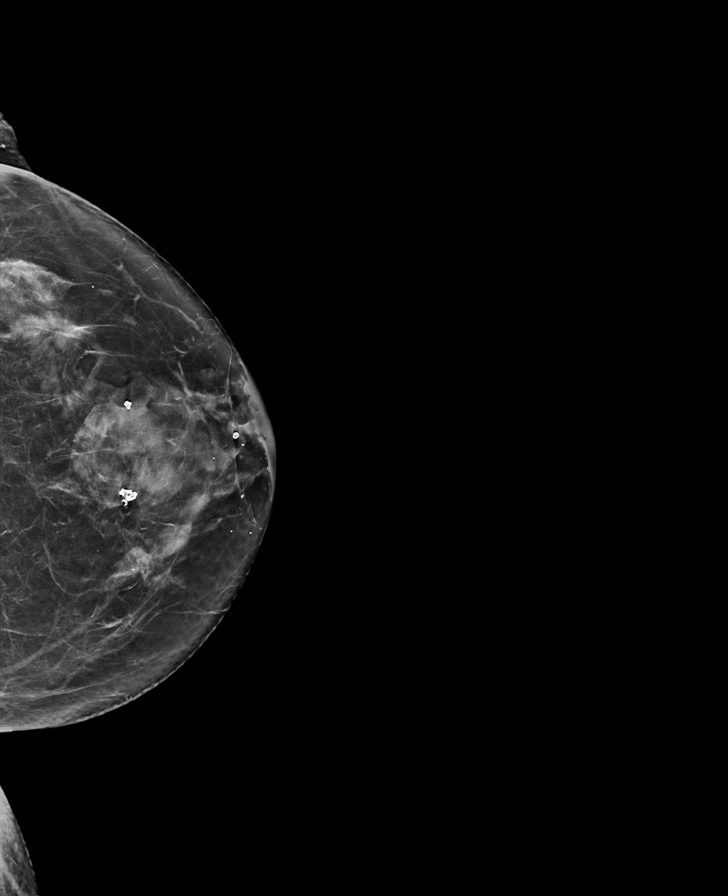

[R CC synth-2D]
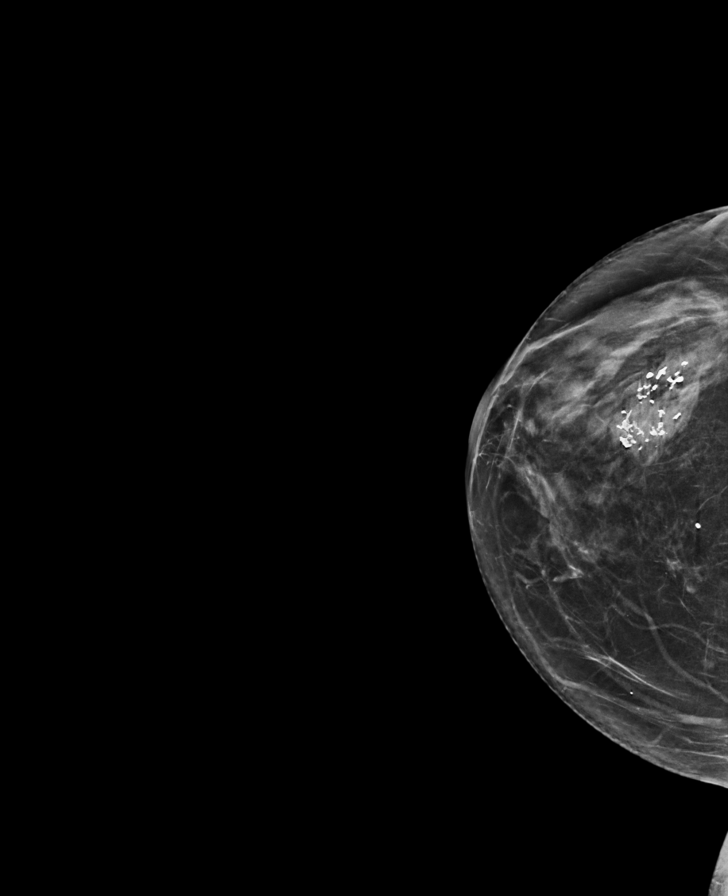

[L MLO synth-2D]
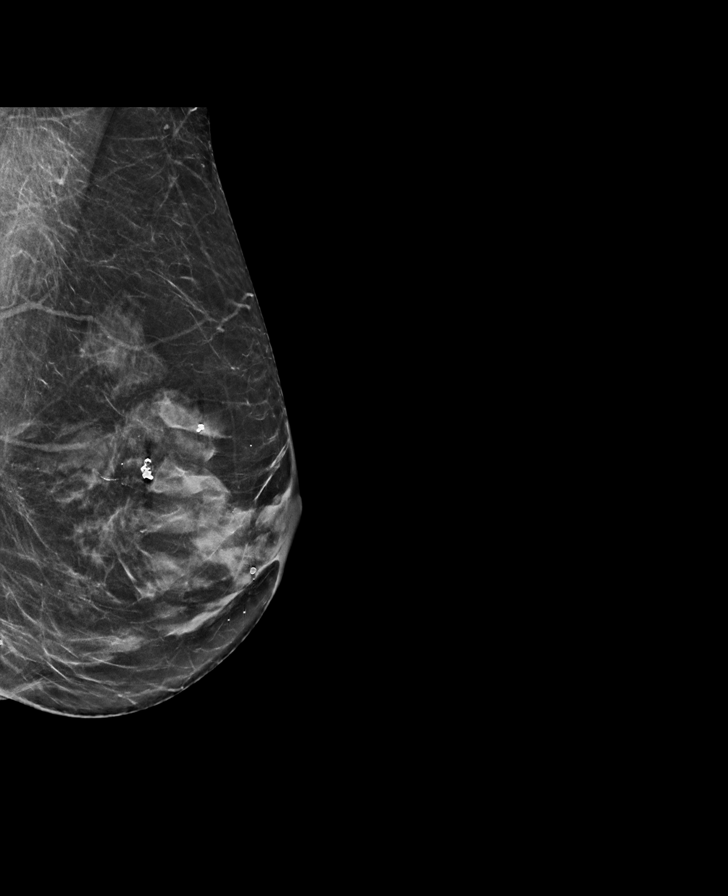

[R MLO synth-2D]
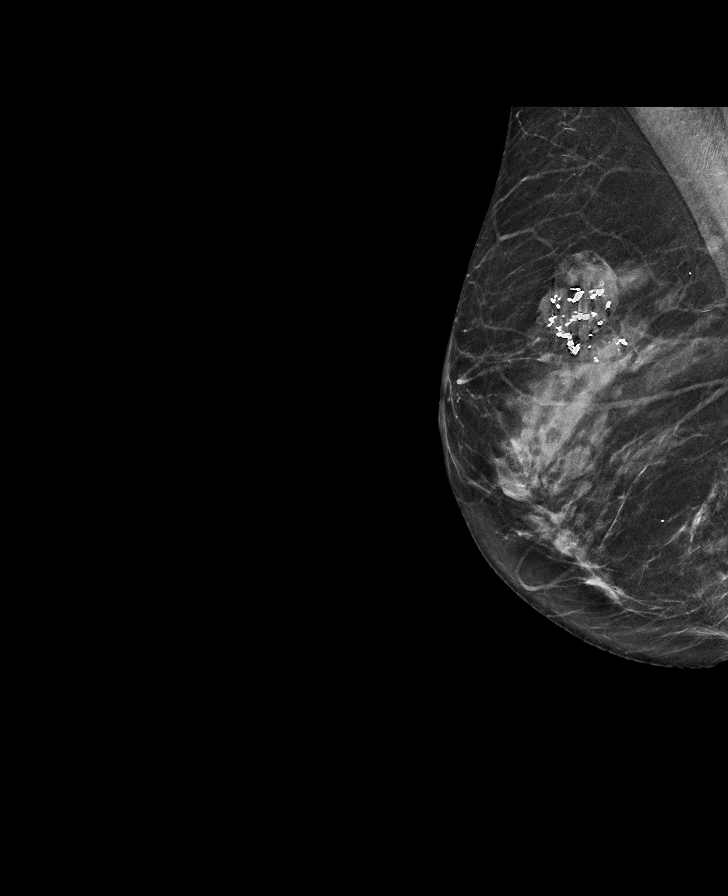

[R CC tomo · tomo slice 35/70.0]
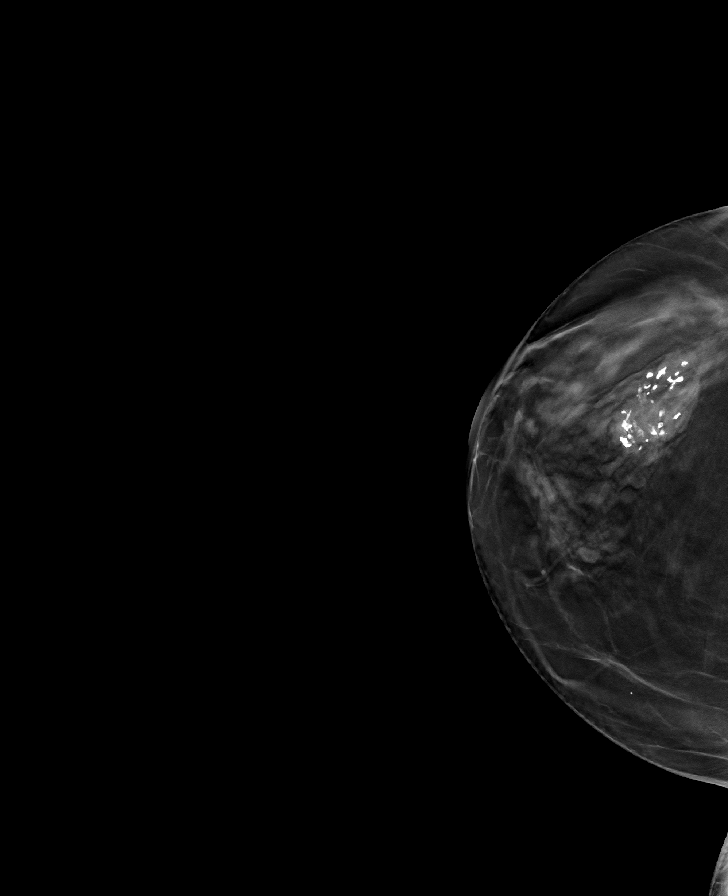

[L MLO tomo · tomo slice 31/62.0]
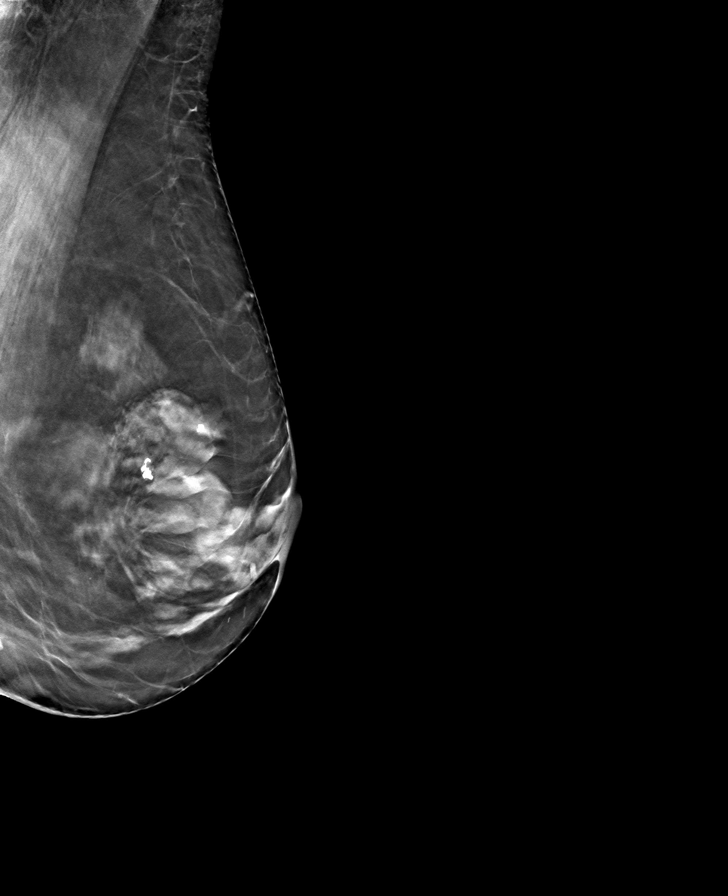

[L CC tomo · tomo slice 33/66.0]
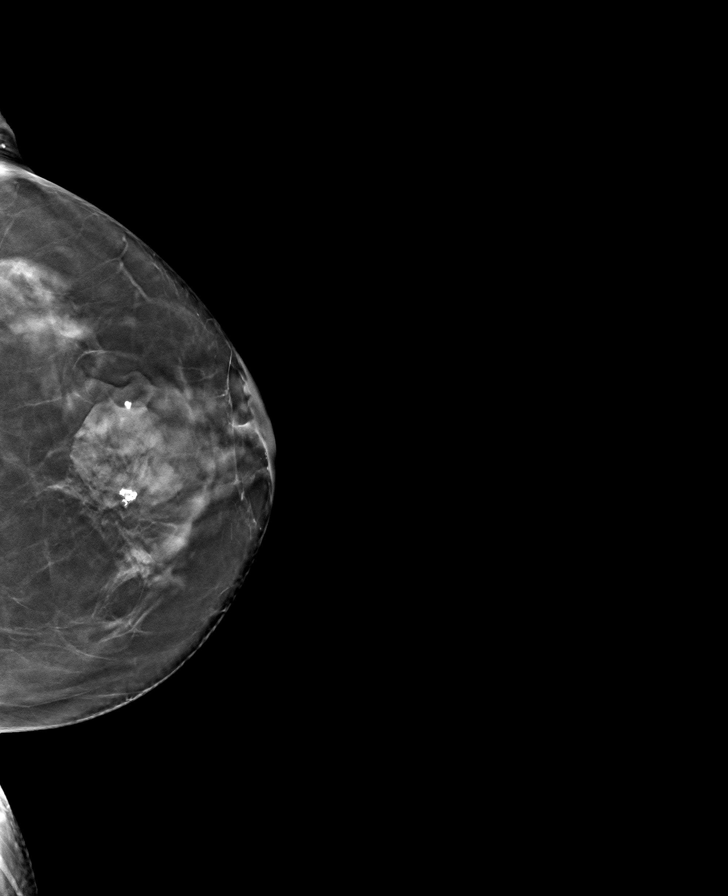

[R MLO tomo · tomo slice 31/62.0]
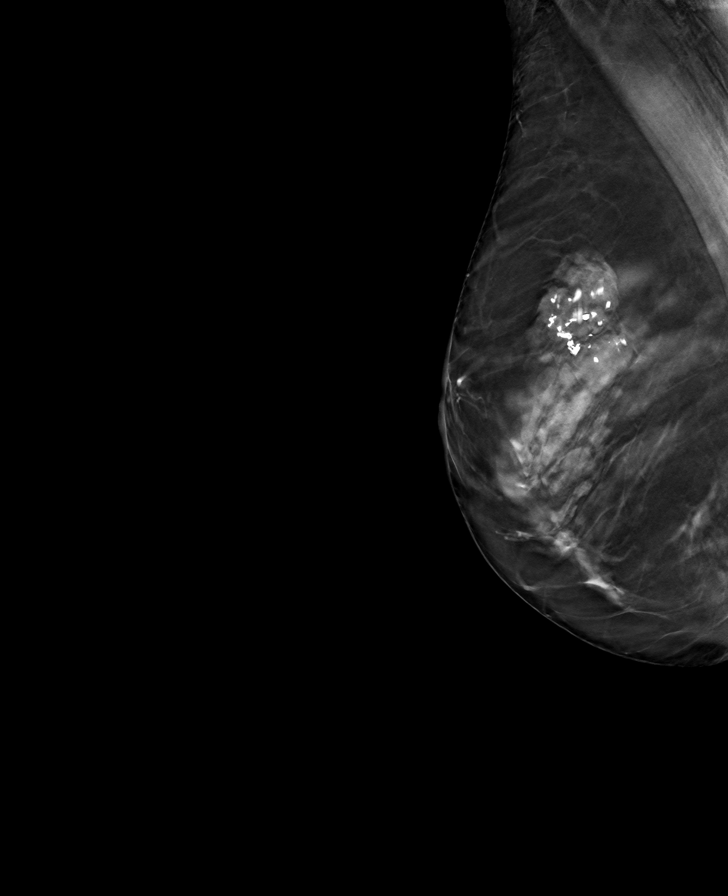

[8 of 24 positions shown; findings below may reference images not displayed]

ACR Breast Density Category c: The breast tissue is heterogeneously
dense, which may obscure small masses.
FINDINGS: There are no findings suspicious for malignancy. Images were
processed with CAD.
IMPRESSION: No mammographic evidence of malignancy. A result letter of this
screening mammogram will be mailed directly to the patient.

RECOMMENDATION:
Screening mammogram in one year. (Code:FT-U-LHB)

BI-RADS CATEGORY  1: Negative.

## 2022-06-30 ENCOUNTER — Other Ambulatory Visit: Payer: Self-pay | Admitting: Internal Medicine

## 2022-06-30 DIAGNOSIS — Z1231 Encounter for screening mammogram for malignant neoplasm of breast: Secondary | ICD-10-CM

## 2022-07-02 ENCOUNTER — Ambulatory Visit
Admission: RE | Admit: 2022-07-02 | Discharge: 2022-07-02 | Disposition: A | Discharge status: Home / Self Care | Payer: Medicare PPO | Source: Ambulatory Visit | Attending: Internal Medicine | Admitting: Internal Medicine

## 2022-07-02 DIAGNOSIS — Z1231 Encounter for screening mammogram for malignant neoplasm of breast: Secondary | ICD-10-CM | POA: Diagnosis not present

## 2022-07-06 DIAGNOSIS — M5136 Other intervertebral disc degeneration, lumbar region: Secondary | ICD-10-CM | POA: Diagnosis not present

## 2022-07-06 DIAGNOSIS — M5416 Radiculopathy, lumbar region: Secondary | ICD-10-CM | POA: Diagnosis not present

## 2022-08-10 DIAGNOSIS — M5416 Radiculopathy, lumbar region: Secondary | ICD-10-CM | POA: Diagnosis not present

## 2022-08-20 ENCOUNTER — Emergency Department (HOSPITAL_BASED_OUTPATIENT_CLINIC_OR_DEPARTMENT_OTHER): Payer: Medicare PPO

## 2022-08-20 ENCOUNTER — Emergency Department (HOSPITAL_BASED_OUTPATIENT_CLINIC_OR_DEPARTMENT_OTHER)
Admission: EM | Admit: 2022-08-20 | Discharge: 2022-08-20 | Disposition: A | Discharge status: Home / Self Care | Payer: Medicare PPO | Attending: Emergency Medicine | Admitting: Emergency Medicine

## 2022-08-20 ENCOUNTER — Encounter (HOSPITAL_BASED_OUTPATIENT_CLINIC_OR_DEPARTMENT_OTHER): Payer: Self-pay

## 2022-08-20 ENCOUNTER — Other Ambulatory Visit: Payer: Self-pay

## 2022-08-20 DIAGNOSIS — E039 Hypothyroidism, unspecified: Secondary | ICD-10-CM | POA: Insufficient documentation

## 2022-08-20 DIAGNOSIS — Z79899 Other long term (current) drug therapy: Secondary | ICD-10-CM | POA: Diagnosis not present

## 2022-08-20 DIAGNOSIS — K5730 Diverticulosis of large intestine without perforation or abscess without bleeding: Secondary | ICD-10-CM | POA: Diagnosis not present

## 2022-08-20 DIAGNOSIS — R109 Unspecified abdominal pain: Secondary | ICD-10-CM | POA: Diagnosis present

## 2022-08-20 DIAGNOSIS — I700 Atherosclerosis of aorta: Secondary | ICD-10-CM | POA: Diagnosis not present

## 2022-08-20 DIAGNOSIS — R1030 Lower abdominal pain, unspecified: Secondary | ICD-10-CM | POA: Diagnosis not present

## 2022-08-20 DIAGNOSIS — K5790 Diverticulosis of intestine, part unspecified, without perforation or abscess without bleeding: Secondary | ICD-10-CM | POA: Diagnosis not present

## 2022-08-20 LAB — COMPREHENSIVE METABOLIC PANEL
ALT: 16 U/L (ref 0–44)
AST: 22 U/L (ref 15–41)
Albumin: 4.3 g/dL (ref 3.5–5.0)
Alkaline Phosphatase: 96 U/L (ref 38–126)
Anion gap: 10 (ref 5–15)
BUN: 20 mg/dL (ref 8–23)
CO2: 23 mmol/L (ref 22–32)
Calcium: 9.5 mg/dL (ref 8.9–10.3)
Chloride: 94 mmol/L — ABNORMAL LOW (ref 98–111)
Creatinine, Ser: 0.67 mg/dL (ref 0.44–1.00)
GFR, Estimated: >60 mL/min (ref >60)
Glucose, Bld: 120 mg/dL — ABNORMAL HIGH (ref 70–99)
Potassium: 3.8 mmol/L (ref 3.5–5.1)
Sodium: 127 mmol/L — ABNORMAL LOW (ref 135–145)
Total Bilirubin: 0.4 mg/dL (ref 0.3–1.2)
Total Protein: 7 g/dL (ref 6.5–8.1)

## 2022-08-20 LAB — CBC WITH DIFFERENTIAL/PLATELET
Abs Immature Granulocytes: 0.03 10*3/uL (ref 0.00–0.07)
Basophils Absolute: 0 10*3/uL (ref 0.0–0.1)
Basophils Relative: 0 %
Eosinophils Absolute: 0.1 10*3/uL (ref 0.0–0.5)
Eosinophils Relative: 1 %
HCT: 36.5 % (ref 36.0–46.0)
Hemoglobin: 12.8 g/dL (ref 12.0–15.0)
Immature Granulocytes: 0 %
Lymphocytes Relative: 19 %
Lymphs Abs: 1.8 10*3/uL (ref 0.7–4.0)
MCH: 32.5 pg (ref 26.0–34.0)
MCHC: 35.1 g/dL (ref 30.0–36.0)
MCV: 92.6 fL (ref 80.0–100.0)
Monocytes Absolute: 1 10*3/uL (ref 0.1–1.0)
Monocytes Relative: 10 %
Neutro Abs: 6.8 10*3/uL (ref 1.7–7.7)
Neutrophils Relative %: 70 %
Platelets: 205 10*3/uL (ref 150–400)
RBC: 3.94 MIL/uL (ref 3.87–5.11)
RDW: 11.9 % (ref 11.5–15.5)
WBC: 9.6 10*3/uL (ref 4.0–10.5)
nRBC: 0 % (ref 0.0–0.2)

## 2022-08-20 LAB — URINALYSIS, ROUTINE W REFLEX MICROSCOPIC
Bilirubin Urine: NEGATIVE
Glucose, UA: NEGATIVE mg/dL
Ketones, ur: NEGATIVE mg/dL
Leukocytes,Ua: NEGATIVE
Nitrite: NEGATIVE
Protein, ur: NEGATIVE mg/dL
Specific Gravity, Urine: <1.005 — ABNORMAL LOW (ref 1.005–1.030)
pH: 6 (ref 5.0–8.0)

## 2022-08-20 LAB — BASIC METABOLIC PANEL
Anion gap: 6 (ref 5–15)
BUN: 13 mg/dL (ref 8–23)
CO2: 25 mmol/L (ref 22–32)
Calcium: 8.2 mg/dL — ABNORMAL LOW (ref 8.9–10.3)
Chloride: 107 mmol/L (ref 98–111)
Creatinine, Ser: 0.53 mg/dL (ref 0.44–1.00)
GFR, Estimated: >60 mL/min (ref >60)
Glucose, Bld: 104 mg/dL — ABNORMAL HIGH (ref 70–99)
Potassium: 3.6 mmol/L (ref 3.5–5.1)
Sodium: 138 mmol/L (ref 135–145)

## 2022-08-20 LAB — LIPASE, BLOOD: Lipase: 22 U/L (ref 11–51)

## 2022-08-20 MED ORDER — TRAMADOL HCL 50 MG PO TABS
50.0000 mg | ORAL_TABLET | Freq: Once | ORAL | Status: AC
  Administered 2022-08-20: 50 mg via ORAL
  Filled 2022-08-20: qty 1

## 2022-08-20 MED ORDER — TRAMADOL HCL 50 MG PO TABS: 50.0000 mg | ORAL_TABLET | Freq: Four times a day (QID) | ORAL | 0 refills | Status: AC | PRN

## 2022-08-20 MED ORDER — SODIUM CHLORIDE 0.9 % IV BOLUS
1000.0000 mL | Freq: Once | INTRAVENOUS | Status: AC
  Administered 2022-08-20: 1000 mL via INTRAVENOUS

## 2022-08-20 MED ORDER — MORPHINE SULFATE (PF) 4 MG/ML IV SOLN: 4.0000 mg | Freq: Once | INTRAVENOUS | Status: DC

## 2022-08-20 MED ORDER — ONDANSETRON HCL 4 MG/2ML IJ SOLN
4.0000 mg | Freq: Once | INTRAMUSCULAR | Status: AC
  Administered 2022-08-20: 4 mg via INTRAVENOUS
  Filled 2022-08-20: qty 2

## 2022-08-20 MED ORDER — IOHEXOL 300 MG/ML  SOLN
100.0000 mL | Freq: Once | INTRAMUSCULAR | Status: AC | PRN
  Administered 2022-08-20: 100 mL via INTRAVENOUS

## 2022-08-20 MED ORDER — AMOXICILLIN-POT CLAVULANATE 875-125 MG PO TABS: 1.0000 | ORAL_TABLET | Freq: Three times a day (TID) | ORAL | 0 refills | Status: AC

## 2022-08-20 NOTE — ED Notes (Signed)
Pt given Gatorade per request and w/ EDP permission.

## 2022-08-20 NOTE — ED Provider Notes (Signed)
[facility] Provider Note   CSN: [id number] Arrival date & time: 08/20/22  1124     History  Chief Complaint  Patient presents with   Abdominal Pain   Nausea    Alexa Rios is a 77 y.o. female with a past medical history of diverticulitis, hypothyroidism, sciatica presenting today for evaluation of abdominal pain.  Patient reports that she has had lower abdominal pain in the last 1 week.  Pain is constant, throbbing, today it radiates to her right lower quadrant.  She endorses nausea without vomiting.  She denies any fever, chest pain, shortness of breath, bowel change, urinary symptoms, blood in stool or urine.  Patient reports she takes a laxative everyday and there is no bowel changes.  Patient reports she has a history of diverticulitis with pain on the right lower quadrant which is different to this episode.  Patient reports she got an epidural injection 2 weeks ago for sciatica.   Abdominal Pain   Past Medical History:  Diagnosis Date   Allergy    Candida esophagitis (HCC)    Colon polyps    Diverticulitis    GERD (gastroesophageal reflux disease)    History of chicken pox    Hypothyroidism    Mesenteric adenitis    Migraine    Paget's disease of bony pelvis    Status post dilation of esophageal narrowing    Thyroid disease    Past Surgical History:  Procedure Laterality Date   BREAST BIOPSY Left 1976   benign per patient   BREAST CYST ASPIRATION Right    30 + yrs ago   CHOLECYSTECTOMY  2015   COLONOSCOPY     KIDNEY CYST REMOVAL     REDUCTION MAMMAPLASTY Bilateral 2005   [place]   VAGINAL HYSTERECTOMY  2000   benign path     Home Medications Prior to Admission medications   Medication Sig Start Date End Date Taking? Authorizing Provider  amoxicillin-clavulanate (AUGMENTIN) 875-125 MG tablet Take 1 tablet by mouth in the morning, at noon, and at bedtime for 5 days. 08/20/22  08/25/22 Yes Rex Kras, PA  traMADol (ULTRAM) 50 MG tablet Take 1 tablet (50 mg total) by mouth every 6 (six) hours as needed. 08/20/22  Yes Rex Kras, PA  amoxicillin-clavulanate (AUGMENTIN) 875-125 MG tablet Take 1 tablet by mouth in the morning and at bedtime. 06/04/21   [provider]  BIOTIN 5000 PO Take 1 tablet by mouth in the morning and at bedtime.    [provider]  CALCIUM PO Take 1,200 mg by mouth daily.    [provider]  Estradiol 1 MG/GM GEL Apply 1mg  twice a week as needed Patient taking differently: Apply 1 application. topically once a week. Apply twice weekly on Wednesday and Sundays 04/09/20   Caren Macadam, MD  EVENING PRIMROSE OIL PO Take 500 mg by mouth daily.    [provider]  gabapentin (NEURONTIN) 100 MG capsule Take 1 capsule by mouth as needed. 12/06/20   [provider]  Glucosamine-Chondroitin (COSAMIN DS PO) Take 1 tablet by mouth daily.    [provider]  hyoscyamine (LEVSIN SL) 0.125 MG SL tablet Place 1 tablet (0.125 mg total) under the tongue every 4 (four) hours as needed. 10/05/21   Gatha Mayer, MD  levothyroxine (SYNTHROID) 25 MCG tablet Take 1 tablet (25 mcg total) by mouth daily before breakfast. 08/20/20   Dwyane Dee, MD  lidocaine (XYLOCAINE) 2 % solution Use as directed 15 mLs in the mouth or throat as needed for mouth pain (sore throat). 10/12/21   Gatha Mayer, MD  Multiple Vitamin (MULTIVITAMIN ADULT PO) Take by mouth.    [provider]  pantoprazole (PROTONIX) 40 MG tablet Take one to two tablets daily as needed. 03/26/22   Gatha Mayer, MD  polyethylene glycol (MIRALAX / GLYCOLAX) 17 g packet Take 17 g by mouth 2 (two) times daily.    [provider]  Potassium 99 MG TABS Take 1 tablet by mouth daily.    [provider]  PREVIDENT 5000 BOOSTER PLUS 1.1 % PSTE Place onto teeth. 11/26/20   [provider]  Ubrogepant (UBRELVY) 50 MG TABS Take 50 mg by  mouth once as needed for up to 1 dose. Patient taking differently: Take 50 mg by mouth as needed. 03/14/20   Caren Macadam, MD      Allergies    Nexium [esomeprazole magnesium], Sulfamethoxazole-trimethoprim, Sumatriptan, Carafate [sucralfate], and Sulfur dioxide    Review of Systems   Review of Systems  Gastrointestinal:  Positive for abdominal pain.    Physical Exam Updated Vital Signs BP 111/71   Pulse 81   Temp 98.2 F (36.8 C) (Oral)   Resp 17   Ht 5\' 5"  (1.651 m)   Wt 60.8 kg   SpO2 99%   BMI 22.30 kg/m  Physical Exam Vitals and nursing note reviewed.  Constitutional:      Appearance: Normal appearance.  HENT:     Head: Normocephalic and atraumatic.     Mouth/Throat:     Mouth: Mucous membranes are moist.  Eyes:     General: No scleral icterus. Cardiovascular:     Rate and Rhythm: Normal rate and regular rhythm.     Pulses: Normal pulses.     Heart sounds: Normal heart sounds.  Pulmonary:     Effort: Pulmonary effort is normal.     Breath sounds: Normal breath sounds.  Abdominal:     General: Abdomen is flat.     Palpations: Abdomen is soft.     Tenderness: There is abdominal tenderness in the right lower quadrant and left lower quadrant.  Musculoskeletal:        General: No deformity.  Skin:    General: Skin is warm.     Findings: No rash.  Neurological:     General: No focal deficit present.     Mental Status: She is alert.  Psychiatric:        Mood and Affect: Mood normal.     ED Results / Procedures / Treatments   Labs (all labs ordered are listed, but only abnormal results are displayed) Labs Reviewed  COMPREHENSIVE METABOLIC PANEL - Abnormal; Notable for the following components:      Result Value   Sodium 127 (*)    Chloride 94 (*)    Glucose, Bld 120 (*)    All other components within normal limits  URINALYSIS, ROUTINE W REFLEX MICROSCOPIC - Abnormal; Notable for the following components:   Color, Urine COLORLESS (*)    Specific  Gravity, Urine <1.005 (*)    Hgb urine dipstick SMALL (*)    Bacteria, UA RARE (*)    All other components within normal limits  CBC WITH DIFFERENTIAL/PLATELET  LIPASE, BLOOD  BASIC METABOLIC PANEL    EKG None  Radiology CT ABDOMEN PELVIS W CONTRAST  Result Date: 08/20/2022 CLINICAL DATA:  Lower abdominal pain.  Nausea. EXAM: CT ABDOMEN AND PELVIS WITH CONTRAST TECHNIQUE: Multidetector CT imaging of the abdomen and pelvis was performed using the standard protocol following bolus administration of intravenous contrast. RADIATION DOSE REDUCTION: This exam was performed according to the departmental dose-optimization program which includes automated exposure control, adjustment of the mA and/or kV according to patient size and/or use of iterative reconstruction technique. CONTRAST:  OMNIPAQUE IOHEXOL 300 MG/ML  SOLN COMPARISON:  CT abdomen dated 03/03/2021. FINDINGS: Lower chest: No acute abnormality. Hepatobiliary: No acute or suspicious findings within the liver. Status post cholecystectomy with commensurate bile duct ectasia. Pancreas: Unremarkable. No pancreatic ductal dilatation or surrounding inflammatory changes. Spleen: Normal in size without focal abnormality. Adrenals/Urinary Tract: Adrenal glands appear normal. Chronic scarring of the posterior cortex of the RIGHT kidney. Kidneys otherwise unremarkable without suspicious mass, stone or hydronephrosis. No ureteral or bladder calculi are identified. Bladder is unremarkable. Stomach/Bowel: No dilated large or small bowel loops. Fluid is seen throughout the majority of the large bowel and within the distal small bowel. No focal or generalized bowel wall thickening. Stomach is unremarkable. Appendix appears normal. There is diverticulosis of the sigmoid colon but no focal inflammatory changes seen to suggest acute diverticulitis. Vascular/Lymphatic: Mild aortic atherosclerosis. No abdominal aortic aneurysm. No acute-appearing vascular  abnormality. No enlarged lymph nodes are seen in the abdomen or pelvis. Reproductive: Presumed hysterectomy.  No adnexal mass or free fluid. Other: No free fluid or abscess collection is seen. No free intraperitoneal air. Musculoskeletal: No acute findings. 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 Degenerative spondylosis of the slightly scoliotic lumbar spine, mild to moderate in degree. IMPRESSION: 1. Fluid within the small and large bowel. This is a nonspecific finding but can indicate underlying gastroenteritis or enterocolitis. 2. No other acute findings within the abdomen or pelvis. No bowel obstruction. No renal or ureteral calculi. Appendix is normal. 3. Diverticulosis of the sigmoid colon but no evidence of diverticulitis. Electronically Signed   By: Bary Richard M.D.   On: 08/20/2022 13:47    Procedures Procedures    Medications Ordered in ED Medications  morphine (PF) 4 MG/ML injection 4 mg (4 mg Intravenous Not Given 08/20/22 1219)  ondansetron (ZOFRAN) injection 4 mg (4 mg Intravenous Given 08/20/22 1218)  iohexol (OMNIPAQUE) 300 MG/ML solution 100 mL (100 mLs Intravenous Contrast Given 08/20/22 1326)  traMADol (ULTRAM) tablet 50 mg (50 mg Oral Given 08/20/22 1605)  sodium chloride 0.9 % bolus 1,000 mL (0 mLs Intravenous Stopped 08/20/22 1710)    ED Course/ Medical Decision Making/ A&P                             Medical Decision Making Amount and/or Complexity of Data Reviewed Labs: ordered. Radiology: ordered.  Risk Prescription drug management.   This patient presents to the ED for abdominal pain, this involves an extensive number of treatment options, and is a complaint that carries with a high risk of complications and morbidity.  The differential diagnosis includes diverticulitis, hernia, diverticulitis, UTI, constipation, ectopic pregnancy, ovarian torsion, ovarian cyst, PID/TOA, period/fibroid.  This is not an exhaustive list.  Lab tests: I ordered and personally  interpreted labs.  The pertinent results include: WBC unremarkable. Hbg unremarkable. Platelets unremarkable.  Hyponatremia 127. BUN, creatinine unremarkable. UA significant for no acute abnormality.  Lipase unremarkable  Imaging studies: I ordered imaging studies. I personally reviewed, interpreted imaging and agree with the radiologist's interpretations. The results include: CT scan show diverticulosis  without diverticulitis, possible underlying gastroenteritis or enterocolitis.  Problem list/ ED course/ Critical interventions/ Medical management: HPI: See above Vital signs within normal range and stable throughout visit. Laboratory/imaging studies significant for: See above. On physical examination, patient is afebrile and appears in no acute distress. Abdominal exam without peritoneal signs. No evidence of acute abdomen at this time. Well appearing. Given work up, low suspicion for acute hepatobiliary disease (including acute cholecystitis or cholangitis), acute pancreatitis (neg lipase), PUD (including gastric perforation), acute infectious processes (pneumonia, hepatitis, pyelonephritis), acute appendicitis, vascular catastrophe, bowel obstruction, viscus perforation, or testicular torsion, diverticulitis. Presentation not consistent with other acute, emergent causes of abdominal pain at this time. Based on patient's clinical presentations and laboratory/imaging studies I suspect diverticulosis vs gastroenteritis. Given history of diverticulitis, she is at high risk to develop diverticulitis with existing diverticulosis and given her age I will send an rx of antibiotics. Patient states she was given flagyl and cipro in the past however she experienced side effect of the medications. She states she was given Augmentin upon discharge from the hospital and tolerated it well.  I will send an rx of Augmentin. I also ordered tramadol for pain control. Reevaluation of the patient after these medications  showed that the patient improved. Repeat BNP showed Na level of 138. Considered admission however I have talked to the hospitalist who did not think she met criteria for admission. Patient has improved significantly after pain meds. I have shared decision making with the patient and she agreed to follow up with outpatient GI and PCP.  Advised patient to follow-up with GI and PCP for further evaluation and management, return to the ER if new or worsening symptoms.. I have reviewed the patient home medicines and have made adjustments as needed.  Cardiac monitoring/EKG: The patient was maintained on a cardiac monitor.  I personally reviewed and interpreted the cardiac monitor which showed an underlying rhythm of: sinus rhythm.  Additional history obtained: External records from outside source obtained and reviewed including: Chart review including previous notes, labs, imaging.  Consultations obtained: I requested consultation with Dr. Cyndia Skeeters hospitalist, and discussed lab and imaging findings as well as pertinent plan.  He recommended IV fluid, pain control and reevaluation.  Disposition Continued outpatient therapy. Follow-up with PCP and gastroenterology recommended for reevaluation of symptoms. Treatment plan discussed with patient.  Pt acknowledged understanding was agreeable to the plan. Worrisome signs and symptoms were discussed with patient, and patient acknowledged understanding to return to the ED if they noticed these signs and symptoms. Patient was stable upon discharge.   This chart was dictated using voice recognition software.  Despite best efforts to proofread,  errors can occur which can change the documentation meaning.          Final Clinical Impression(s) / ED Diagnoses Final diagnoses:  Abdominal pain, unspecified abdominal location  Diverticulosis    Rx / DC Orders ED Discharge Orders          Ordered    amoxicillin-clavulanate (AUGMENTIN) 875-125 MG tablet  3  times daily        08/20/22 1726    traMADol (ULTRAM) 50 MG tablet  Every 6 hours PRN        08/20/22 1922              Rex Kras, [place] 08/20/22 2206    Pattricia Boss, MD 08/21/22 [phone]

## 2022-08-20 NOTE — ED Triage Notes (Signed)
Pt c/o generalized abdominal pain and nausea x4 days.  Pain score 6/10.  Pt reports similar pain previously and diagnosed w/ diverticulitis.  Sts "spasms" prior to BMs.  Pt reports taking Miralax earlier.      Pt is requesting not to be given pain medication.

## 2022-08-20 NOTE — Discharge Instructions (Addendum)
Please take your antibiotics as prescribed. Take tylenol/ibuprofen or tramadol as needed for pain. I recommend close follow-up with gastroenterology and PCP for reevaluation.  Please do not hesitate to return to emergency department if worrisome signs symptoms we discussed become apparent.

## 2022-08-25 ENCOUNTER — Telehealth: Payer: Self-pay | Admitting: Internal Medicine

## 2022-08-25 DIAGNOSIS — R194 Change in bowel habit: Secondary | ICD-10-CM | POA: Diagnosis not present

## 2022-08-25 DIAGNOSIS — R109 Unspecified abdominal pain: Secondary | ICD-10-CM | POA: Diagnosis not present

## 2022-08-25 DIAGNOSIS — K529 Noninfective gastroenteritis and colitis, unspecified: Secondary | ICD-10-CM | POA: Diagnosis not present

## 2022-08-25 NOTE — Telephone Encounter (Signed)
Pt stated that she recently went to the ED and had a CT scan done that s showed fluid in the small and large bowel. Pt stated that she went to her PCP this AM and they submitted an urgernt referral for her to be seen. Pt stated that she is still having abdominal pain.  Pt was scheduled for 08/26/2022 at 8:50 to see Dr. Carlean Purl. Pt made aware Pt verbalized understanding with all questions answered.

## 2022-08-25 NOTE — Telephone Encounter (Signed)
Urgent referral in [place].  Patient had a recent CT can that showed fluid in the small and large bowel.  She is having severe abd pain.  Please advise scheduling?

## 2022-08-26 ENCOUNTER — Encounter: Payer: Self-pay | Admitting: Internal Medicine

## 2022-08-26 ENCOUNTER — Ambulatory Visit: Payer: Medicare PPO | Admitting: Internal Medicine

## 2022-08-26 VITALS — BP 100/62 | HR 85 | Ht 65.0 in | Wt 136.0 lb

## 2022-08-26 DIAGNOSIS — K5792 Diverticulitis of intestine, part unspecified, without perforation or abscess without bleeding: Secondary | ICD-10-CM | POA: Diagnosis not present

## 2022-08-26 DIAGNOSIS — R1031 Right lower quadrant pain: Secondary | ICD-10-CM

## 2022-08-26 NOTE — Patient Instructions (Signed)
Continue using the Tramadol and Augmentin.   Please up date [place] on Monday.   Due to recent changes in healthcare laws, you may see the results of your imaging and laboratory studies on MyChart before your provider has had a chance to review them.  We understand that in some cases there may be results that are confusing or concerning to you. Not all laboratory results come back in the same time frame and the provider may be waiting for multiple results in order to interpret others.  Please give [place] 48 hours in order for your provider to thoroughly review all the results before contacting the office for clarification of your results.    I appreciate the opportunity to care for you. Silvano Rusk, MD, [facility]

## 2022-08-26 NOTE — Progress Notes (Signed)
Alexa Rios 77 y.o. [birth year] [id number]  Assessment & Plan:   Encounter Diagnoses  Name Primary?   RLQ abdominal pain Yes   Diverticulitis ?    Working diagnosis is possible occult diverticulitis.  I do not think the fluid seen in her intestine on CT scan is pathologic.  She does not really seem to have signs of enteritis or colitis.  She has had problems like this before but on the left.  She tends to be a slow responder to treatment and often has symptoms without objective findings on imaging as outlined in the history.  I have encouraged her to use the tramadol since it relieves the pain, and continue with the antibiotics.  She has follow-up with primary care tomorrow.  Whether or not she needs a repeat CT scan is not clear, it is reasonable to consider but could also consider waiting for potential effects of the additional course of generic Augmentin.  So far there are no worrisome features on labs or imaging.    Copy Alexa Agent, NP  Subjective:   Chief Complaint: Right lower quadrant pain  HPI 77 year old white woman known to me with recurrent functional GI complaints of suspected diverticulitis, who presents with right lower quadrant pain since February 2.  She developed fairly severe pain out of the blue, and the right lower quadrant constant throbbing with some waxing and waning, and pain that feels like "salt on an open wound" after defecating, lasting for about an hour. She went to the ED on that day where a CT scan of the abdomen pelvis showed no significant pathology.  They commented the fluid in the small and large bowel might represent gastroenteritis or enterocolitis but she is not having nausea vomiting or diarrhea.  She has chronic constipation which is stable and treated with MiraLAX.  She tried taking Nexium to see if that would help but it did not.  She saw Alexa Agent, NP at her primary care office the other day.  A repeat CBC showed  normal white count.  She has a follow-up tomorrow.  The patient reports that she will be considered for a repeat CT if she has persistent problems.  She has not reported a fever.  She did have 2 epidural injections in the last month or so for left-sided sciatica.  No change in medications otherwise.  Alexa Rios did add an additional 5 days of Augmentin to her course of treatment.  The patient has tramadol at home prescribed by Alexa Rios, she was reluctant to take it because it is not a narcotic class of medication.  She was encouraged to try it by Alexa Rios and she took 25 mg (half tablet) and did get some relief.  She has not taken another dose.  There is some pain with sitting and movement.  There are no genitourinary symptoms other than slight urgency since this began.  CT abdomen pelvis with contrast images reviewed personally IMPRESSION: 1. Fluid within the small and large bowel. This is a nonspecific finding but can indicate underlying gastroenteritis or enterocolitis. 2. No other acute findings within the abdomen or pelvis. No bowel obstruction. No renal or ureteral calculi. Appendix is normal. 3. Diverticulosis of the sigmoid colon but no evidence of diverticulitis.  CT abdomen pelvis with contrast January 2022 with diverticulitis of the sigmoid colon and a suspected abscess February 2022 CT resolution of abscess in early February and in late February continued improvement in diverticulitis   CT abdomen  pelvis July 2022, August 2022 twice all without signs of diverticulitis.  CT abdomen pelvis 03/03/2021 to evaluate right lower quadrant pain and hematuria negative   EGD 11/21/2020 normal   Colonoscopy 11/21/2020 with with sigmoid diverticulosis and colonic lumen narrowing Lab Results  Component Value Date   WBC 9.6 08/20/2022   HGB 12.8 08/20/2022   HCT 36.5 08/20/2022   MCV 92.6 08/20/2022   PLT 205 08/20/2022   Lab Results  Component Value Date   LIPASE 22 08/20/2022   Lab  Results  Component Value Date   CREATININE 0.53 08/20/2022   BUN 13 08/20/2022   NA 138 08/20/2022   K 3.6 08/20/2022   CL 107 08/20/2022   CO2 25 08/20/2022   Lab Results  Component Value Date   ALT 16 08/20/2022   AST 22 08/20/2022   ALKPHOS 96 08/20/2022   BILITOT 0.4 08/20/2022   Urinalysis with a small positive hemoglobin on the dipstick and rare bacteria otherwise negative.  CBC 08/25/2022 at primary care white count 5.51 hemoglobin 12.5 hematocrit 36  Allergies  Allergen Reactions   Nexium [Esomeprazole Magnesium] Other (See Comments)    Headaches and ? Abdominal pain   Sulfamethoxazole-Trimethoprim    Sumatriptan     palpitations   Carafate [Sucralfate] Palpitations   Sulfur Dioxide Palpitations    Current Outpatient Medications:    amoxicillin-clavulanate (AUGMENTIN) 875-125 MG tablet, Take 1 tablet by mouth in the morning and at bedtime., Disp: , Rfl:    BIOTIN 5000 PO, Take 1 tablet by mouth in the morning and at bedtime., Disp: , Rfl:    CALCIUM PO, Take 1,200 mg by mouth daily., Disp: , Rfl:    Estradiol 1 MG/GM GEL, Apply 1mg  twice a week as needed (Patient taking differently: Apply 1 application. topically once a week. Apply twice weekly on Wednesday and Sundays), Disp: 1 g, Rfl: 0   EVENING PRIMROSE OIL PO, Take 500 mg by mouth daily., Disp: , Rfl:    gabapentin (NEURONTIN) 100 MG capsule, Take 1 capsule by mouth as needed., Disp: , Rfl:    Glucosamine-Chondroitin (COSAMIN DS PO), Take 1 tablet by mouth daily., Disp: , Rfl:    levothyroxine (SYNTHROID) 25 MCG tablet, Take 1 tablet (25 mcg total) by mouth daily before breakfast., Disp: 30 tablet, Rfl: 1   Multiple Vitamin (MULTIVITAMIN ADULT PO), Take by mouth., Disp: , Rfl:    pantoprazole (PROTONIX) 40 MG tablet, Take one to two tablets daily as needed., Disp: 180 tablet, Rfl: 0   polyethylene glycol (MIRALAX / GLYCOLAX) 17 g packet, Take 17 g by mouth 2 (two) times daily., Disp: , Rfl:    Potassium 99 MG  TABS, Take 1 tablet by mouth daily., Disp: , Rfl:    PREVIDENT 5000 BOOSTER PLUS 1.1 % PSTE, Place onto teeth., Disp: , Rfl:    traMADol (ULTRAM) 50 MG tablet, Take 1 tablet (50 mg total) by mouth every 6 (six) hours as needed., Disp: 15 tablet, Rfl: 0   Ubrogepant (UBRELVY) 50 MG TABS, Take 50 mg by mouth once as needed for up to 1 dose. (Patient taking differently: Take 50 mg by mouth as needed.), Disp: 30 tablet, Rfl: 2  Past Medical History:  Diagnosis Date   Allergy    Candida esophagitis (HCC)    Colon polyps    Diverticulitis    GERD (gastroesophageal reflux disease)    History of chicken pox    Hypothyroidism    Mesenteric adenitis    Migraine  Paget's disease of bony pelvis    Status post dilation of esophageal narrowing    Thyroid disease    Past Surgical History:  Procedure Laterality Date   BREAST BIOPSY Left 1976   benign per patient   BREAST CYST ASPIRATION Right    30 + yrs ago   CHOLECYSTECTOMY  2015   COLONOSCOPY     KIDNEY CYST REMOVAL     REDUCTION MAMMAPLASTY Bilateral 2005   [place]   benign path   Social History   Social History Narrative   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, 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   Moved to [place] from [place] in May 2021   No alcohol   1 caffeinated beverage daily   Never smoker no drugs no other tobacco   family history includes Arthritis in her sister; Cancer in her brother; Diabetes in her brother and father; Early death in her father; Heart disease in her maternal grandmother and paternal grandmother; High Cholesterol in her maternal grandmother and paternal grandmother; Hyperlipidemia in her brother, father, mother, and sister; Lymphoma in her father; Prostate cancer in her paternal grandfather; Rheum arthritis in her mother; Stroke in her mother and paternal grandfather.   Review of Systems As per HPI  Objective:   Physical Exam BP  100/62   Pulse 85   Ht 5\' 5"  (1.651 m)   Wt 136 lb (61.7 kg)   BMI 22.63 kg/m  NAD Moves well Back no CVAT Abd soft and mildly tender w/o guarding RLQ + flank BS + Negative Carnett's  Data reviewed as per HPI, 42 minutes spent on this visit before during and after patient interaction.

## 2022-08-27 DIAGNOSIS — K529 Noninfective gastroenteritis and colitis, unspecified: Secondary | ICD-10-CM | POA: Diagnosis not present

## 2022-08-27 DIAGNOSIS — R194 Change in bowel habit: Secondary | ICD-10-CM | POA: Diagnosis not present

## 2022-08-27 DIAGNOSIS — R109 Unspecified abdominal pain: Secondary | ICD-10-CM | POA: Diagnosis not present

## 2022-08-30 ENCOUNTER — Other Ambulatory Visit: Payer: Self-pay

## 2022-08-30 ENCOUNTER — Encounter: Payer: Self-pay | Admitting: Internal Medicine

## 2022-08-30 DIAGNOSIS — R10813 Right lower quadrant abdominal tenderness: Secondary | ICD-10-CM

## 2022-08-30 DIAGNOSIS — R1031 Right lower quadrant pain: Secondary | ICD-10-CM

## 2022-08-30 DIAGNOSIS — K5792 Diverticulitis of intestine, part unspecified, without perforation or abscess without bleeding: Secondary | ICD-10-CM

## 2022-08-30 NOTE — Telephone Encounter (Signed)
Pt contacted in regard to my chart  message sent. Pt stated that her symptoms are still  the same and this weekend she started to have night sweats requiring change of clothing. Pt stated that she was under the understanding that the providers may want to repeat a CT scan. Please advise

## 2022-08-30 NOTE — Telephone Encounter (Signed)
Pt was made aware of Dr. Carlean Purl recommendations.  Pt was scheduled for the CT scan on 09/02/2022 at 3:00 PM at [facility]. Address provided. Pt to arrive at 12:45 to check in: Nothing to eat or drink starting at 11:00 AM. Pt made aware. Pt verbalized understanding with all questions answered.

## 2022-09-02 ENCOUNTER — Ambulatory Visit (HOSPITAL_BASED_OUTPATIENT_CLINIC_OR_DEPARTMENT_OTHER)
Admission: RE | Admit: 2022-09-02 | Discharge: 2022-09-02 | Disposition: A | Discharge status: Home / Self Care | Payer: Medicare PPO | Source: Ambulatory Visit | Attending: Internal Medicine | Admitting: Internal Medicine

## 2022-09-02 DIAGNOSIS — I700 Atherosclerosis of aorta: Secondary | ICD-10-CM | POA: Diagnosis not present

## 2022-09-02 DIAGNOSIS — R1031 Right lower quadrant pain: Secondary | ICD-10-CM | POA: Insufficient documentation

## 2022-09-02 DIAGNOSIS — K5792 Diverticulitis of intestine, part unspecified, without perforation or abscess without bleeding: Secondary | ICD-10-CM | POA: Diagnosis not present

## 2022-09-02 DIAGNOSIS — R10813 Right lower quadrant abdominal tenderness: Secondary | ICD-10-CM | POA: Diagnosis not present

## 2022-09-02 MED ORDER — IOHEXOL 300 MG/ML  SOLN
100.0000 mL | Freq: Once | INTRAMUSCULAR | Status: AC | PRN
  Administered 2022-09-02: 100 mL via INTRAVENOUS

## 2022-09-03 ENCOUNTER — Telehealth: Payer: Self-pay | Admitting: Internal Medicine

## 2022-09-03 MED ORDER — PREDNISONE 10 MG PO TABS: ORAL_TABLET | ORAL | 0 refills | Status: AC

## 2022-09-03 NOTE — Telephone Encounter (Signed)
Patient's second CT scan within a week or so negative for diverticulitis but persistent burning right lower quadrant pain.  Can intensify after defecation.  Primary care has prescribed some fluconazole because the patient think she is having some yeast like symptoms in her throat as she has had in the past.  She questions if prior steroid shots could be related but those are about 6 to 7 weeks ago now.  (Epidural).   I suppose this could be neuropathic in origin and still related to her back i.e. spine.  I am going to put her on a 7-day prednisone taper.  She is going to be taking fluconazole for 3 more days, and if not better after that empiric therapy she will start the prednisone taper.  I do not think there is any role for more antibiotics.   Meds ordered this encounter  Medications   predniSONE (DELTASONE) 10 MG tablet    Sig: Take 5 tablets (50 mg total) by mouth daily with breakfast for 1 day, THEN 4 tablets (40 mg total) daily with breakfast for 1 day, THEN 3 tablets (30 mg total) daily with breakfast for 1 day, THEN 2 tablets (20 mg total) daily with breakfast for 2 days, THEN 1 tablet (10 mg total) daily with breakfast for 2 days.    Dispense:  18 tablet    Refill:  0

## 2022-10-12 DIAGNOSIS — L570 Actinic keratosis: Secondary | ICD-10-CM | POA: Diagnosis not present

## 2022-10-12 DIAGNOSIS — D0471 Carcinoma in situ of skin of right lower limb, including hip: Secondary | ICD-10-CM | POA: Diagnosis not present

## 2022-10-27 DIAGNOSIS — M48061 Spinal stenosis, lumbar region without neurogenic claudication: Secondary | ICD-10-CM | POA: Diagnosis not present

## 2022-10-27 DIAGNOSIS — M4317 Spondylolisthesis, lumbosacral region: Secondary | ICD-10-CM | POA: Diagnosis not present

## 2022-10-27 DIAGNOSIS — M47812 Spondylosis without myelopathy or radiculopathy, cervical region: Secondary | ICD-10-CM | POA: Diagnosis not present

## 2022-10-27 DIAGNOSIS — M47814 Spondylosis without myelopathy or radiculopathy, thoracic region: Secondary | ICD-10-CM | POA: Diagnosis not present

## 2022-10-27 DIAGNOSIS — M4807 Spinal stenosis, lumbosacral region: Secondary | ICD-10-CM | POA: Diagnosis not present

## 2022-10-27 DIAGNOSIS — M4726 Other spondylosis with radiculopathy, lumbar region: Secondary | ICD-10-CM | POA: Diagnosis not present

## 2022-10-27 DIAGNOSIS — M419 Scoliosis, unspecified: Secondary | ICD-10-CM | POA: Diagnosis not present

## 2022-10-27 DIAGNOSIS — M5416 Radiculopathy, lumbar region: Secondary | ICD-10-CM | POA: Diagnosis not present

## 2022-10-27 DIAGNOSIS — M4316 Spondylolisthesis, lumbar region: Secondary | ICD-10-CM | POA: Diagnosis not present

## 2022-10-28 DIAGNOSIS — M8589 Other specified disorders of bone density and structure, multiple sites: Secondary | ICD-10-CM | POA: Diagnosis not present

## 2022-11-30 DIAGNOSIS — M48061 Spinal stenosis, lumbar region without neurogenic claudication: Secondary | ICD-10-CM | POA: Diagnosis not present

## 2022-11-30 DIAGNOSIS — M4807 Spinal stenosis, lumbosacral region: Secondary | ICD-10-CM | POA: Diagnosis not present

## 2022-11-30 DIAGNOSIS — M47816 Spondylosis without myelopathy or radiculopathy, lumbar region: Secondary | ICD-10-CM | POA: Diagnosis not present

## 2022-11-30 DIAGNOSIS — M5126 Other intervertebral disc displacement, lumbar region: Secondary | ICD-10-CM | POA: Diagnosis not present

## 2022-11-30 DIAGNOSIS — M4316 Spondylolisthesis, lumbar region: Secondary | ICD-10-CM | POA: Diagnosis not present

## 2022-12-03 DIAGNOSIS — M5416 Radiculopathy, lumbar region: Secondary | ICD-10-CM | POA: Diagnosis not present

## 2022-12-03 DIAGNOSIS — M48061 Spinal stenosis, lumbar region without neurogenic claudication: Secondary | ICD-10-CM | POA: Diagnosis not present

## 2022-12-23 DIAGNOSIS — K5792 Diverticulitis of intestine, part unspecified, without perforation or abscess without bleeding: Secondary | ICD-10-CM | POA: Diagnosis not present

## 2022-12-23 DIAGNOSIS — R194 Change in bowel habit: Secondary | ICD-10-CM | POA: Diagnosis not present

## 2022-12-23 DIAGNOSIS — K529 Noninfective gastroenteritis and colitis, unspecified: Secondary | ICD-10-CM | POA: Diagnosis not present

## 2022-12-23 DIAGNOSIS — M5136 Other intervertebral disc degeneration, lumbar region: Secondary | ICD-10-CM | POA: Diagnosis not present

## 2022-12-23 DIAGNOSIS — R109 Unspecified abdominal pain: Secondary | ICD-10-CM | POA: Diagnosis not present

## 2022-12-23 LAB — LAB REPORT - SCANNED: EGFR: 69.9

## 2023-01-10 DIAGNOSIS — J0110 Acute frontal sinusitis, unspecified: Secondary | ICD-10-CM | POA: Diagnosis not present

## 2023-01-21 DIAGNOSIS — M5136 Other intervertebral disc degeneration, lumbar region: Secondary | ICD-10-CM | POA: Diagnosis not present

## 2023-01-21 DIAGNOSIS — R519 Headache, unspecified: Secondary | ICD-10-CM | POA: Diagnosis not present

## 2023-01-21 DIAGNOSIS — M5481 Occipital neuralgia: Secondary | ICD-10-CM | POA: Diagnosis not present

## 2023-01-21 DIAGNOSIS — G500 Trigeminal neuralgia: Secondary | ICD-10-CM | POA: Diagnosis not present

## 2023-02-11 DIAGNOSIS — R519 Headache, unspecified: Secondary | ICD-10-CM | POA: Diagnosis not present

## 2023-02-11 DIAGNOSIS — H5203 Hypermetropia, bilateral: Secondary | ICD-10-CM | POA: Diagnosis not present

## 2023-02-11 DIAGNOSIS — H52203 Unspecified astigmatism, bilateral: Secondary | ICD-10-CM | POA: Diagnosis not present

## 2023-02-16 DIAGNOSIS — M5481 Occipital neuralgia: Secondary | ICD-10-CM | POA: Diagnosis not present

## 2023-02-16 DIAGNOSIS — G500 Trigeminal neuralgia: Secondary | ICD-10-CM | POA: Diagnosis not present

## 2023-02-17 DIAGNOSIS — M4726 Other spondylosis with radiculopathy, lumbar region: Secondary | ICD-10-CM | POA: Diagnosis not present

## 2023-02-17 DIAGNOSIS — M48061 Spinal stenosis, lumbar region without neurogenic claudication: Secondary | ICD-10-CM | POA: Diagnosis not present

## 2023-02-17 DIAGNOSIS — M5116 Intervertebral disc disorders with radiculopathy, lumbar region: Secondary | ICD-10-CM | POA: Diagnosis not present

## 2023-03-01 DIAGNOSIS — J343 Hypertrophy of nasal turbinates: Secondary | ICD-10-CM | POA: Diagnosis not present

## 2023-03-01 DIAGNOSIS — J310 Chronic rhinitis: Secondary | ICD-10-CM | POA: Diagnosis not present

## 2023-03-01 DIAGNOSIS — J342 Deviated nasal septum: Secondary | ICD-10-CM | POA: Diagnosis not present

## 2023-03-03 DIAGNOSIS — M5481 Occipital neuralgia: Secondary | ICD-10-CM | POA: Diagnosis not present

## 2023-03-16 DIAGNOSIS — M542 Cervicalgia: Secondary | ICD-10-CM | POA: Diagnosis not present

## 2023-03-25 DIAGNOSIS — M8580 Other specified disorders of bone density and structure, unspecified site: Secondary | ICD-10-CM | POA: Diagnosis not present

## 2023-03-25 DIAGNOSIS — E039 Hypothyroidism, unspecified: Secondary | ICD-10-CM | POA: Diagnosis not present

## 2023-03-25 DIAGNOSIS — G500 Trigeminal neuralgia: Secondary | ICD-10-CM | POA: Diagnosis not present

## 2023-03-25 DIAGNOSIS — M5481 Occipital neuralgia: Secondary | ICD-10-CM | POA: Diagnosis not present

## 2023-03-25 DIAGNOSIS — R7989 Other specified abnormal findings of blood chemistry: Secondary | ICD-10-CM | POA: Diagnosis not present

## 2023-03-30 ENCOUNTER — Other Ambulatory Visit (HOSPITAL_COMMUNITY): Payer: Self-pay | Admitting: Internal Medicine

## 2023-03-30 DIAGNOSIS — E039 Hypothyroidism, unspecified: Secondary | ICD-10-CM | POA: Diagnosis not present

## 2023-03-30 DIAGNOSIS — G43919 Migraine, unspecified, intractable, without status migrainosus: Secondary | ICD-10-CM | POA: Diagnosis not present

## 2023-03-30 DIAGNOSIS — Z0000 Encounter for general adult medical examination without abnormal findings: Secondary | ICD-10-CM | POA: Diagnosis not present

## 2023-03-30 DIAGNOSIS — R1031 Right lower quadrant pain: Secondary | ICD-10-CM

## 2023-03-30 DIAGNOSIS — M5136 Other intervertebral disc degeneration, lumbar region: Secondary | ICD-10-CM | POA: Diagnosis not present

## 2023-03-30 DIAGNOSIS — R519 Headache, unspecified: Secondary | ICD-10-CM | POA: Diagnosis not present

## 2023-03-30 DIAGNOSIS — R109 Unspecified abdominal pain: Secondary | ICD-10-CM | POA: Diagnosis not present

## 2023-03-30 DIAGNOSIS — Z1331 Encounter for screening for depression: Secondary | ICD-10-CM | POA: Diagnosis not present

## 2023-03-30 DIAGNOSIS — Z1339 Encounter for screening examination for other mental health and behavioral disorders: Secondary | ICD-10-CM | POA: Diagnosis not present

## 2023-03-30 DIAGNOSIS — M5481 Occipital neuralgia: Secondary | ICD-10-CM | POA: Diagnosis not present

## 2023-03-31 ENCOUNTER — Ambulatory Visit (HOSPITAL_COMMUNITY)
Admission: RE | Admit: 2023-03-31 | Discharge: 2023-03-31 | Disposition: A | Discharge status: Home / Self Care | Payer: Medicare PPO | Source: Ambulatory Visit | Attending: Internal Medicine | Admitting: Internal Medicine

## 2023-03-31 DIAGNOSIS — R1031 Right lower quadrant pain: Secondary | ICD-10-CM | POA: Diagnosis not present

## 2023-03-31 DIAGNOSIS — K5750 Diverticulosis of both small and large intestine without perforation or abscess without bleeding: Secondary | ICD-10-CM | POA: Diagnosis not present

## 2023-03-31 MED ORDER — IOHEXOL 350 MG/ML SOLN
75.0000 mL | Freq: Once | INTRAVENOUS | Status: AC | PRN
  Administered 2023-03-31: 75 mL via INTRAVENOUS

## 2023-04-03 ENCOUNTER — Encounter: Payer: Self-pay | Admitting: Internal Medicine

## 2023-04-05 MED ORDER — DICYCLOMINE HCL 10 MG PO CAPS: ORAL_CAPSULE | ORAL | 0 refills | Status: DC

## 2023-04-05 NOTE — Telephone Encounter (Signed)
Pt made aware of Dr. Leone Payor recommendations: Pt was scheduled to see Alcide Evener NP on 04/12/2023 at 8:30 AM. Pt made aware. Pt verbalized understanding with all questions answered.

## 2023-04-05 NOTE — Telephone Encounter (Signed)
About 2 weeks ago severe explosive watery diarrhea  Still w/ some issues but not as bad  Is hydrating  Increased nausea   Taking Zofran and pepto-bismol; she stopped MiraLax   Meds ordered this encounter  Medications   dicyclomine (BENTYL) 10 MG capsule    Sig: 1-2 capsules every 6 hours as needed for abdominal pain    Dispense:  90 capsule    Refill:  0    Advised continued hydration and advance diet as tolerated  Please schedule w/ an APP 9/24 or 9/25  thx

## 2023-04-06 DIAGNOSIS — R194 Change in bowel habit: Secondary | ICD-10-CM | POA: Diagnosis not present

## 2023-04-06 DIAGNOSIS — R109 Unspecified abdominal pain: Secondary | ICD-10-CM | POA: Diagnosis not present

## 2023-04-06 DIAGNOSIS — R1031 Right lower quadrant pain: Secondary | ICD-10-CM | POA: Diagnosis not present

## 2023-04-07 LAB — LAB REPORT - SCANNED: EGFR: 81.4

## 2023-04-08 ENCOUNTER — Other Ambulatory Visit: Payer: Self-pay | Admitting: Internal Medicine

## 2023-04-08 MED ORDER — SUCRALFATE 1 G PO TABS: 1.0000 g | ORAL_TABLET | Freq: Three times a day (TID) | ORAL | 0 refills | Status: DC

## 2023-04-11 NOTE — Progress Notes (Unsigned)
04/11/2023 Alexa Rios [id number] 06/26/1946   Chief Complaint:  History of Present Illness: Alexa Rios is a 77 year old female with a past medical history of      Latest Ref Rng & Units 08/20/2022   12:11 PM 02/19/2021    5:21 PM 02/11/2021    6:09 PM  CBC  WBC 4.0 - 10.5 K/uL 9.6  6.6  5.6   Hemoglobin 12.0 - 15.0 g/dL 13.2  44.0  10.2   Hematocrit 36.0 - 46.0 % 36.5  37.9  36.0   Platelets 150 - 400 K/uL 205  254  244        Latest Ref Rng & Units 08/20/2022    7:00 PM 08/20/2022   12:11 PM 02/19/2021    5:21 PM  CMP  Glucose 70 - 99 mg/dL 725  366  92   BUN 8 - 23 mg/dL 13  20  17    Creatinine 0.44 - 1.00 mg/dL 4.40  3.47  4.25   Sodium 135 - 145 mmol/L 138  127  132   Potassium 3.5 - 5.1 mmol/L 3.6  3.8  3.5   Chloride 98 - 111 mmol/L 107  94  98   CO2 22 - 32 mmol/L 25  23  23    Calcium 8.9 - 10.3 mg/dL 8.2  9.5  9.8   Total Protein 6.5 - 8.1 g/dL  7.0  7.0   Total Bilirubin 0.3 - 1.2 mg/dL  0.4  0.6   Alkaline Phos 38 - 126 U/L  96  92   AST 15 - 41 U/L  22  28   ALT 0 - 44 U/L  16  22     Colonoscopy 11/21/2020 by Dr. Leone Payor: - Severe diverticulosis in the sigmoid colon. There was narrowing of the colon in association with the diverticular opening.  - The examination was otherwise normal on direct and retroflexion views except for some hypertrophied anal papillae.  - No specimens collected. - No further colonoscopies due to age   EGD 11/21/2020: - Normal esophagus.  - Gastroesophageal flap valve classified as Hill Grade I (prominent fold, tight to endoscope).  - Normal stomach.  - Normal examined duodenum.  - No specimens collected.    Current Medications, Allergies, Past Medical History, Past Surgical History, Family History and Social History were reviewed in [employer] record.   Review of Systems:   Constitutional: Negative for fever, sweats, chills or weight loss.  Respiratory: Negative for shortness of  breath.   Cardiovascular: Negative for chest pain, palpitations and leg swelling.  Gastrointestinal: See HPI.  Musculoskeletal: Negative for back pain or muscle aches.  Neurological: Negative for dizziness, headaches or paresthesias.    Physical Exam: There were no vitals taken for this visit. General: in no acute distress. Head: Normocephalic and atraumatic. Eyes: No scleral icterus. Conjunctiva pink . Ears: Normal auditory acuity. Mouth: Dentition intact. No ulcers or lesions.  Lungs: Clear throughout to auscultation. Heart: Regular rate and rhythm, no murmur. Abdomen: Soft, nontender and nondistended. No masses or hepatomegaly. Normal bowel sounds x 4 quadrants.  Rectal: Deferred.  Musculoskeletal: Symmetrical with no gross deformities. Extremities: No edema. Neurological: Alert oriented x 4. No focal deficits.  Psychological: Alert and cooperative. Normal mood and affect  Assessment and Recommendations:

## 2023-04-12 ENCOUNTER — Encounter: Payer: Self-pay | Admitting: Nurse Practitioner

## 2023-04-12 ENCOUNTER — Ambulatory Visit: Payer: Medicare PPO | Admitting: Nurse Practitioner

## 2023-04-12 VITALS — BP 100/60 | HR 84 | Ht 65.0 in | Wt 132.0 lb

## 2023-04-12 DIAGNOSIS — A09 Infectious gastroenteritis and colitis, unspecified: Secondary | ICD-10-CM | POA: Diagnosis not present

## 2023-04-12 DIAGNOSIS — K5900 Constipation, unspecified: Secondary | ICD-10-CM

## 2023-04-12 DIAGNOSIS — R1012 Left upper quadrant pain: Secondary | ICD-10-CM | POA: Diagnosis not present

## 2023-04-12 NOTE — Patient Instructions (Addendum)
Your provider has ordered "Diatherix" stool testing for you. You have received a kit from our office today containing all necessary supplies to complete this test. Please carefully read the stool collection instructions provided in the kit before opening the accompanying materials. In addition, be sure to place the label from the top left corner of the laboratory request sheet onto the "puritan opti-swab" tube that is supplied in the kit. This label should include your full name and date of birth. After completing the test, you should secure the purtian tube into the specimen biohazard bag. The laboratory request information sheet (including date and time of specimen collection) should be placed into the outside pocket of the specimen biohazard bag and returned to the [facility] lab with 2 days of collection.    Pantoprazole 40 mg- take 1 capsule 30 minutes before breakfast  Increase Famotidine to 40 mg- take 1 tablet every evening  Gaviscon liquid- 1 tablespoon three times daily as needed  Contact Colleen,NP in 2 weeks with an update.  Due to recent changes in healthcare laws, you may see the results of your imaging and laboratory studies on MyChart before your provider has had a chance to review them.  We understand that in some cases there may be results that are confusing or concerning to you. Not all laboratory results come back in the same time frame and the provider may be waiting for multiple results in order to interpret others.  Please give [place] 48 hours in order for your provider to thoroughly review all the results before contacting the office for clarification of your results.   Thank you for trusting me with your gastrointestinal care!   Alcide Evener, CRNP

## 2023-04-18 ENCOUNTER — Telehealth: Payer: Self-pay

## 2023-04-18 NOTE — Telephone Encounter (Signed)
Contacted pt & pt verbalized understanding of negative Diatherix h pylori stool test.

## 2023-05-08 DIAGNOSIS — Z20822 Contact with and (suspected) exposure to covid-19: Secondary | ICD-10-CM | POA: Diagnosis not present

## 2023-05-08 DIAGNOSIS — Z1159 Encounter for screening for other viral diseases: Secondary | ICD-10-CM | POA: Diagnosis not present

## 2023-05-08 DIAGNOSIS — B349 Viral infection, unspecified: Secondary | ICD-10-CM | POA: Diagnosis not present

## 2023-05-09 ENCOUNTER — Encounter: Payer: Self-pay | Admitting: Internal Medicine

## 2023-05-11 NOTE — Telephone Encounter (Signed)
Referral and pt records faxed to Dr. Romie Levee of [facility] surgery regarding chronic recurrent left lower quadrant pain and severe diverticulosis

## 2023-05-15 ENCOUNTER — Emergency Department (HOSPITAL_BASED_OUTPATIENT_CLINIC_OR_DEPARTMENT_OTHER): Payer: Medicare PPO

## 2023-05-15 ENCOUNTER — Encounter (HOSPITAL_BASED_OUTPATIENT_CLINIC_OR_DEPARTMENT_OTHER): Payer: Self-pay | Admitting: Emergency Medicine

## 2023-05-15 ENCOUNTER — Emergency Department (HOSPITAL_BASED_OUTPATIENT_CLINIC_OR_DEPARTMENT_OTHER)
Admission: EM | Admit: 2023-05-15 | Discharge: 2023-05-15 | Disposition: A | Discharge status: Home / Self Care | Payer: Medicare PPO | Attending: Emergency Medicine | Admitting: Emergency Medicine

## 2023-05-15 ENCOUNTER — Other Ambulatory Visit: Payer: Self-pay

## 2023-05-15 DIAGNOSIS — I10 Essential (primary) hypertension: Secondary | ICD-10-CM | POA: Insufficient documentation

## 2023-05-15 DIAGNOSIS — E039 Hypothyroidism, unspecified: Secondary | ICD-10-CM | POA: Insufficient documentation

## 2023-05-15 DIAGNOSIS — Z79890 Hormone replacement therapy (postmenopausal): Secondary | ICD-10-CM | POA: Insufficient documentation

## 2023-05-15 DIAGNOSIS — R197 Diarrhea, unspecified: Secondary | ICD-10-CM | POA: Insufficient documentation

## 2023-05-15 DIAGNOSIS — R509 Fever, unspecified: Secondary | ICD-10-CM | POA: Diagnosis not present

## 2023-05-15 DIAGNOSIS — R1032 Left lower quadrant pain: Secondary | ICD-10-CM | POA: Diagnosis not present

## 2023-05-15 DIAGNOSIS — K5730 Diverticulosis of large intestine without perforation or abscess without bleeding: Secondary | ICD-10-CM | POA: Diagnosis not present

## 2023-05-15 LAB — CBC
HCT: 37.1 % (ref 36.0–46.0)
Hemoglobin: 12.8 g/dL (ref 12.0–15.0)
MCH: 32.9 pg (ref 26.0–34.0)
MCHC: 34.5 g/dL (ref 30.0–36.0)
MCV: 95.4 fL (ref 80.0–100.0)
Platelets: 248 10*3/uL (ref 150–400)
RBC: 3.89 MIL/uL (ref 3.87–5.11)
RDW: 12.1 % (ref 11.5–15.5)
WBC: 6.5 10*3/uL (ref 4.0–10.5)
nRBC: 0 % (ref 0.0–0.2)

## 2023-05-15 LAB — COMPREHENSIVE METABOLIC PANEL
ALT: 20 U/L (ref 0–44)
AST: 22 U/L (ref 15–41)
Albumin: 4.3 g/dL (ref 3.5–5.0)
Alkaline Phosphatase: 85 U/L (ref 38–126)
Anion gap: 8 (ref 5–15)
BUN: 15 mg/dL (ref 8–23)
CO2: 27 mmol/L (ref 22–32)
Calcium: 9.9 mg/dL (ref 8.9–10.3)
Chloride: 101 mmol/L (ref 98–111)
Creatinine, Ser: 0.71 mg/dL (ref 0.44–1.00)
GFR, Estimated: >60 mL/min (ref >60)
Glucose, Bld: 95 mg/dL (ref 70–99)
Potassium: 3.6 mmol/L (ref 3.5–5.1)
Sodium: 136 mmol/L (ref 135–145)
Total Bilirubin: 0.3 mg/dL (ref 0.3–1.2)
Total Protein: 7.1 g/dL (ref 6.5–8.1)

## 2023-05-15 LAB — URINALYSIS, ROUTINE W REFLEX MICROSCOPIC
Bacteria, UA: NONE SEEN
Bilirubin Urine: NEGATIVE
Glucose, UA: NEGATIVE mg/dL
Ketones, ur: NEGATIVE mg/dL
Leukocytes,Ua: NEGATIVE
Nitrite: NEGATIVE
Protein, ur: NEGATIVE mg/dL
Specific Gravity, Urine: <1.005 — ABNORMAL LOW (ref 1.005–1.030)
pH: 6 (ref 5.0–8.0)

## 2023-05-15 LAB — LIPASE, BLOOD: Lipase: 24 U/L (ref 11–51)

## 2023-05-15 MED ORDER — HYDROCORTISONE ACETATE 25 MG RE SUPP: 25.0000 mg | Freq: Two times a day (BID) | RECTAL | 0 refills | Status: DC

## 2023-05-15 MED ORDER — IOHEXOL 300 MG/ML  SOLN
100.0000 mL | Freq: Once | INTRAMUSCULAR | Status: AC | PRN
  Administered 2023-05-15: 100 mL via INTRAVENOUS

## 2023-05-15 MED ORDER — ONDANSETRON 4 MG PO TBDP: 4.0000 mg | ORAL_TABLET | Freq: Three times a day (TID) | ORAL | 0 refills | Status: DC | PRN

## 2023-05-15 MED ORDER — TRAMADOL HCL 50 MG PO TABS
25.0000 mg | ORAL_TABLET | Freq: Once | ORAL | Status: AC
  Administered 2023-05-15: 25 mg via ORAL
  Filled 2023-05-15: qty 1

## 2023-05-15 MED ORDER — ONDANSETRON HCL 4 MG/2ML IJ SOLN
4.0000 mg | Freq: Once | INTRAMUSCULAR | Status: AC
  Administered 2023-05-15: 4 mg via INTRAVENOUS
  Filled 2023-05-15: qty 2

## 2023-05-15 NOTE — ED Provider Notes (Signed)
[place] DE[facility] AT [facility] Provider Note   CSN: [id number] Arrival date & time: 05/15/23  7829     History  Chief Complaint  Patient presents with   Abdominal Pain    Alexa Rios is a 77 y.o. female.   Abdominal Pain   77 year old female presents emergency department with complaints of left lower quadrant abdominal pain, diarrhea.  Patient states that she began with fever last weekend Saturday through Monday.  States that she has been taking Motrin for fever which helped.  States that on Monday morning, developed left lower quadrant abdominal pain.  States that she has had intermittent pain in the area for the past week this seems to be relieved with bowel movements.  States that last night and today, symptoms became more persistent prompting visit to the emergency department.  Denies any current fever, emesis, chest pain, shortness of breath, urinary symptoms, medic easier/melena.  Does report associated nausea.  States she has a history of diverticulitis and states this feels somewhat similar but different because pain is more sharp than usual.  Reports hospitalization secondary to diverticulitis a few years ago and is concerned about the same  Past medical history significant for diverticulitis, GERD, hypothyroidism, Candida esophagitis, mesenteric adenitis, Paget's disease of pelvis, migraine, chronic constipation  Home Medications Prior to Admission medications   Medication Sig Start Date End Date Taking? Authorizing Provider  hydrocortisone (ANUSOL-HC) 25 MG suppository Place 1 suppository (25 mg total) rectally 2 (two) times daily. 05/15/23  Yes Sherian Maroon A, PA  ondansetron (ZOFRAN-ODT) 4 MG disintegrating tablet Take 1 tablet (4 mg total) by mouth every 8 (eight) hours as needed. 05/15/23  Yes Sherian Maroon A, PA  BIOTIN 5000 PO Take 1 tablet by mouth in the morning and at bedtime.    [provider]  CALCIUM PO Take 1,200  mg by mouth daily.    [provider]  esomeprazole (NEXIUM) 20 MG capsule Take 40 mg by mouth daily at 12 noon.    [provider]  Estradiol 1 MG/GM GEL Apply 1mg  twice a week as needed Patient taking differently: Apply 1 application  topically once a week. Apply twice weekly on Wednesday and Sundays 04/09/20   Wynn Banker, MD  EVENING PRIMROSE OIL PO Take 500 mg by mouth daily.    [provider]  gabapentin (NEURONTIN) 100 MG capsule Take 1 capsule by mouth as needed. 12/06/20   [provider]  Glucosamine-Chondroitin (COSAMIN DS PO) Take 1 tablet by mouth daily.    [provider]  levothyroxine (SYNTHROID) 25 MCG tablet Take 1 tablet (25 mcg total) by mouth daily before breakfast. 08/20/20   Lewie Chamber, MD  Multiple Vitamin (MULTIVITAMIN ADULT PO) Take by mouth.    [provider]  pantoprazole (PROTONIX) 40 MG tablet Take one to two tablets daily as needed. Patient not taking: Reported on 04/12/2023 03/26/22   Iva Boop, MD  polyethylene glycol (MIRALAX / GLYCOLAX) 17 g packet Take 17 g by mouth 2 (two) times daily.    [provider]  Potassium 99 MG TABS Take 1 tablet by mouth daily.    [provider]  PREVIDENT 5000 BOOSTER PLUS 1.1 % PSTE Place onto teeth. 11/26/20   [provider]  sucralfate (CARAFATE) 1 g tablet Take 1 tablet (1 g total) by mouth 4 (four) times daily -  with meals and at bedtime. 04/08/23   Iva Boop, MD  traMADol Janean Sark)  50 MG tablet Take 1 tablet (50 mg total) by mouth every 6 (six) hours as needed. 08/20/22   Jeanelle Malling, PA  Ubrogepant (UBRELVY) 50 MG TABS Take 50 mg by mouth once as needed for up to 1 dose. Patient taking differently: Take 50 mg by mouth as needed. 03/14/20   Wynn Banker, MD      Allergies    Morphine, Sulfamethoxazole-trimethoprim, Sumatriptan, Carafate [sucralfate], and Sulfur dioxide    Review of Systems   Review of Systems   Gastrointestinal:  Positive for abdominal pain.  All other systems reviewed and are negative.   Physical Exam Updated Vital Signs BP (!) 129/91   Pulse 81   Temp 97.8 F (36.6 C) (Oral)   Resp 16   Wt 60.3 kg   SpO2 100%   BMI 22.13 kg/m  Physical Exam Vitals and nursing note reviewed.  Constitutional:      General: She is not in acute distress.    Appearance: She is well-developed.  HENT:     Head: Normocephalic and atraumatic.  Eyes:     Conjunctiva/sclera: Conjunctivae normal.  Cardiovascular:     Rate and Rhythm: Normal rate and regular rhythm.     Heart sounds: No murmur heard. Pulmonary:     Effort: Pulmonary effort is normal. No respiratory distress.     Breath sounds: Normal breath sounds.  Abdominal:     Palpations: Abdomen is soft.     Tenderness: There is abdominal tenderness in the left lower quadrant. There is no right CVA tenderness, left CVA tenderness, guarding or rebound. Negative signs include Murphy's sign and McBurney's sign.  Musculoskeletal:        General: No swelling.     Cervical back: Neck supple.  Skin:    General: Skin is warm and dry.     Capillary Refill: Capillary refill takes less than 2 seconds.  Neurological:     Mental Status: She is alert.  Psychiatric:        Mood and Affect: Mood normal.     ED Results / Procedures / Treatments   Labs (all labs ordered are listed, but only abnormal results are displayed) Labs Reviewed  URINALYSIS, ROUTINE W REFLEX MICROSCOPIC - Abnormal; Notable for the following components:      Result Value   Color, Urine COLORLESS (*)    Specific Gravity, Urine <1.005 (*)    Hgb urine dipstick MODERATE (*)    All other components within normal limits  LIPASE, BLOOD  COMPREHENSIVE METABOLIC PANEL  CBC    EKG None  Radiology CT ABDOMEN PELVIS W CONTRAST  Result Date: 05/15/2023 CLINICAL DATA:  Left lower quadrant abdominal pain. Intermittent fevers and diarrhea. EXAM: CT ABDOMEN AND PELVIS  WITH CONTRAST TECHNIQUE: Multidetector CT imaging of the abdomen and pelvis was performed using the standard protocol following bolus administration of intravenous contrast. RADIATION DOSE REDUCTION: This exam was performed according to the departmental dose-optimization program which includes automated exposure control, adjustment of the mA and/or kV according to patient size and/or use of iterative reconstruction technique. CONTRAST:  OMNIPAQUE IOHEXOL 300 MG/ML  SOLN COMPARISON:  03/31/23 FINDINGS: Lower chest: No acute abnormality. Hepatobiliary: Unchanged too small to characterize 4 mm low-density structure in the lateral dome of right hepatic lobe. No suspicious liver lesions. Status post cholecystectomy. Unchanged increase caliber of the common bile duct and intrahepatic bile ducts. CBD measures 1.4 cm, image 58/5. Unchanged from previous exam. Pancreas: Unremarkable. No pancreatic ductal dilatation or surrounding inflammatory  changes. Spleen: Normal in size without focal abnormality. Adrenals/Urinary Tract: Normal adrenal glands. Scarring is noted within the posterior cortex of the right kidney. No nephrolithiasis, hydronephrosis, or suspicious mass. Normal appearance of the bladder. Stomach/Bowel: Normal stomach. The appendix is visualized and appears normal. No pathologic dilatation of the bowel loops to suggest obstruction. Sigmoid diverticulosis. Liquid stool identified within the colon. No convincing evidence for diverticulitis. No wall thickening or inflammatory changes identified. Vascular/Lymphatic: Aortic atherosclerosis without aneurysm. Patent abdominal vascularity. No signs of abdominopelvic adenopathy. Reproductive: Status post hysterectomy. No adnexal masses. Other: No free fluid or fluid collections identified at this time. No signs of pneumoperitoneum. Musculoskeletal: There are pagetoid changes involving the left iliac bone as before. Lumbar spondylosis. No acute or suspicious osseous  findings. IMPRESSION: 1. Liquid stool identified within the colon compatible with diarrheal illness. 2. Sigmoid diverticulosis without convincing evidence for diverticulitis. 3. Unchanged increase caliber of the common bile duct and intrahepatic bile ducts. This is likely related to prior cholecystectomy. 4.  Aortic Atherosclerosis (ICD10-I70.0). Electronically Signed   By: Signa Kell M.D.   On: 05/15/2023 11:36    Procedures Procedures    Medications Ordered in ED Medications  traMADol (ULTRAM) tablet 25 mg (25 mg Oral Given 05/15/23 0942)  ondansetron (ZOFRAN) injection 4 mg (4 mg Intravenous Given 05/15/23 0941)  iohexol (OMNIPAQUE) 300 MG/ML solution 100 mL (100 mLs Intravenous Contrast Given 05/15/23 1105)    ED Course/ Medical Decision Making/ A&P                                 Medical Decision Making Amount and/or Complexity of Data Reviewed Labs: ordered. Radiology: ordered.  Risk Prescription drug management.   This patient presents to the ED for concern of abdominal pain, this involves an extensive number of treatment options, and is a complaint that carries with it a high risk of complications and morbidity.  The differential diagnosis includes appendicitis, diverticulitis, abscess, peritonitis, pyelonephritis, nephrolithiasis, cystitis, AAA, aortic dissection, other   Co morbidities that complicate the patient evaluation  See HPI   Additional history obtained:  Additional history obtained from EMR External records from outside source obtained and reviewed including hospital records   Lab Tests:  I Ordered, and personally interpreted labs.  The pertinent results include: No leukocytosis.  No evidence of anemia.  Platelets within normal range.  No electrolyte abnormalities.  No transaminitis.  No renal dysfunction.  Lipase within normal limits.  UA significant for hemoglobin but otherwise unremarkable.   Imaging Studies ordered:  I ordered imaging studies  including CT abdomen pelvis I independently visualized and interpreted imaging which showed liquid stool within colon.  Sigmoid diverticulosis.  Aortic atherosclerosis. I agree with the radiologist interpretation  Cardiac Monitoring: / EKG:  The patient was maintained on a cardiac monitor.  I personally viewed and interpreted the cardiac monitored which showed an underlying rhythm of: Sinus rhythm   Consultations Obtained:  N/a   Problem List / ED Course / Critical interventions / Medication management  Left lower quadrant abdominal pain I ordered medication including tramadol, Zofran   Reevaluation of the patient after these medicines showed that the patient improved I have reviewed the patients home medicines and have made adjustments as needed   Social Determinants of Health:  Denies tobacco, illicit drug use   Test / Admission - Considered:  Left lower quadrant abdominal pain, diarrhea Vitals signs significant for hypertension blood pressure  145/94. Otherwise within normal range and stable throughout visit. Laboratory/imaging studies significant for: See above 77 year old female presents emergency department with complaints of left lower quadrant abdominal pain as well as diarrhea.  Symptoms been present since Monday.  Patient with history of uncomplicated and complicated diverticulitis.  Patient describes symptoms as different from regular diverticulitis type pain to subsequently, imaging and labs were obtained.  On exam, patient with tenderness mainly in left lower quadrant.  Imaging/Labs unremarkable for any acute process.  Unsure of exact etiology of patient's left lower quadrant but symptoms do not seem emergent.  Regarding diarrhea, seems more chronic in nature.  Patient has been on MiraLAX at home for the past 20+ years per patient.  States that she has been increasing her MiraLAX dose at home over the past week despite having diarrhea.  Will recommend decreasing her  MiraLAX dose.  Will send patient home with antiemetic as well as recommend close follow-up with PCP/GI in the outpatient setting.  Treatment plan discussed at length with patient and she acknowledged understanding was agreeable to said plan.  Patient overall well-appearing, afebrile in no acute distress. Worrisome signs and symptoms were discussed with the patient, and the patient acknowledged understanding to return to the ED if noticed. Patient was stable upon discharge.          Final Clinical Impression(s) / ED Diagnoses Final diagnoses:  Left lower quadrant abdominal pain  Diarrhea, unspecified type    Rx / DC Orders ED Discharge Orders     None         Peter Garter, [place] 05/15/23 1442    Melene Plan, DO 05/15/23 1511

## 2023-05-15 NOTE — Discharge Instructions (Addendum)
As discussed, CT scan did not show evidence of diverticulitis or other acute abnormalities in your abdomen this is because of your pain.  Recommend stop taking medication that is causing diarrhea.  Recommend follow-up with GI/primary care for reassessment of your symptoms.  Please not hesitate to return to emergency department for worrisome signs and symptoms we discussed become apparent.

## 2023-05-15 NOTE — ED Notes (Signed)
Dc instructions reviewed with patient. Patient voiced understanding. Dc with belongings.  °

## 2023-05-15 NOTE — ED Triage Notes (Signed)
Abdominal pain right lower abdomen for about a week, diarrhea, fevers intermittent.

## 2023-05-30 ENCOUNTER — Encounter: Payer: Self-pay | Admitting: Nurse Practitioner

## 2023-06-10 ENCOUNTER — Other Ambulatory Visit: Payer: Self-pay | Admitting: Internal Medicine

## 2023-06-10 DIAGNOSIS — Z1231 Encounter for screening mammogram for malignant neoplasm of breast: Secondary | ICD-10-CM

## 2023-06-13 DIAGNOSIS — R1084 Generalized abdominal pain: Secondary | ICD-10-CM | POA: Diagnosis not present

## 2023-06-20 DIAGNOSIS — Z85828 Personal history of other malignant neoplasm of skin: Secondary | ICD-10-CM | POA: Diagnosis not present

## 2023-06-20 DIAGNOSIS — L820 Inflamed seborrheic keratosis: Secondary | ICD-10-CM | POA: Diagnosis not present

## 2023-06-20 DIAGNOSIS — L218 Other seborrheic dermatitis: Secondary | ICD-10-CM | POA: Diagnosis not present

## 2023-06-20 DIAGNOSIS — R208 Other disturbances of skin sensation: Secondary | ICD-10-CM | POA: Diagnosis not present

## 2023-06-20 DIAGNOSIS — L821 Other seborrheic keratosis: Secondary | ICD-10-CM | POA: Diagnosis not present

## 2023-06-20 DIAGNOSIS — D0462 Carcinoma in situ of skin of left upper limb, including shoulder: Secondary | ICD-10-CM | POA: Diagnosis not present

## 2023-06-20 DIAGNOSIS — L570 Actinic keratosis: Secondary | ICD-10-CM | POA: Diagnosis not present

## 2023-06-22 DIAGNOSIS — M5116 Intervertebral disc disorders with radiculopathy, lumbar region: Secondary | ICD-10-CM | POA: Diagnosis not present

## 2023-06-22 DIAGNOSIS — M5416 Radiculopathy, lumbar region: Secondary | ICD-10-CM | POA: Diagnosis not present

## 2023-06-22 DIAGNOSIS — M4726 Other spondylosis with radiculopathy, lumbar region: Secondary | ICD-10-CM | POA: Diagnosis not present

## 2023-06-22 DIAGNOSIS — M48061 Spinal stenosis, lumbar region without neurogenic claudication: Secondary | ICD-10-CM | POA: Diagnosis not present

## 2023-06-29 DIAGNOSIS — J309 Allergic rhinitis, unspecified: Secondary | ICD-10-CM | POA: Diagnosis not present

## 2023-06-29 DIAGNOSIS — R109 Unspecified abdominal pain: Secondary | ICD-10-CM | POA: Diagnosis not present

## 2023-06-29 DIAGNOSIS — R051 Acute cough: Secondary | ICD-10-CM | POA: Diagnosis not present

## 2023-06-29 DIAGNOSIS — Z1152 Encounter for screening for COVID-19: Secondary | ICD-10-CM | POA: Diagnosis not present

## 2023-06-29 DIAGNOSIS — J029 Acute pharyngitis, unspecified: Secondary | ICD-10-CM | POA: Diagnosis not present

## 2023-06-29 DIAGNOSIS — J329 Chronic sinusitis, unspecified: Secondary | ICD-10-CM | POA: Diagnosis not present

## 2023-06-29 DIAGNOSIS — R5383 Other fatigue: Secondary | ICD-10-CM | POA: Diagnosis not present

## 2023-06-29 DIAGNOSIS — R9389 Abnormal findings on diagnostic imaging of other specified body structures: Secondary | ICD-10-CM | POA: Diagnosis not present

## 2023-07-04 DIAGNOSIS — B370 Candidal stomatitis: Secondary | ICD-10-CM | POA: Diagnosis not present

## 2023-07-04 DIAGNOSIS — R5383 Other fatigue: Secondary | ICD-10-CM | POA: Diagnosis not present

## 2023-07-04 DIAGNOSIS — R062 Wheezing: Secondary | ICD-10-CM | POA: Diagnosis not present

## 2023-07-04 DIAGNOSIS — Z1152 Encounter for screening for COVID-19: Secondary | ICD-10-CM | POA: Diagnosis not present

## 2023-07-04 DIAGNOSIS — J209 Acute bronchitis, unspecified: Secondary | ICD-10-CM | POA: Diagnosis not present

## 2023-07-04 DIAGNOSIS — J309 Allergic rhinitis, unspecified: Secondary | ICD-10-CM | POA: Diagnosis not present

## 2023-07-04 DIAGNOSIS — R0981 Nasal congestion: Secondary | ICD-10-CM | POA: Diagnosis not present

## 2023-07-08 ENCOUNTER — Emergency Department (HOSPITAL_BASED_OUTPATIENT_CLINIC_OR_DEPARTMENT_OTHER)
Admission: EM | Admit: 2023-07-08 | Discharge: 2023-07-08 | Disposition: A | Discharge status: Home / Self Care | Payer: Medicare PPO | Attending: Emergency Medicine | Admitting: Emergency Medicine

## 2023-07-08 ENCOUNTER — Emergency Department (HOSPITAL_BASED_OUTPATIENT_CLINIC_OR_DEPARTMENT_OTHER): Payer: Medicare PPO | Admitting: Radiology

## 2023-07-08 ENCOUNTER — Emergency Department (HOSPITAL_BASED_OUTPATIENT_CLINIC_OR_DEPARTMENT_OTHER): Payer: Medicare PPO

## 2023-07-08 ENCOUNTER — Encounter (HOSPITAL_BASED_OUTPATIENT_CLINIC_OR_DEPARTMENT_OTHER): Payer: Self-pay | Admitting: Emergency Medicine

## 2023-07-08 ENCOUNTER — Other Ambulatory Visit: Payer: Self-pay

## 2023-07-08 DIAGNOSIS — M25462 Effusion, left knee: Secondary | ICD-10-CM | POA: Diagnosis not present

## 2023-07-08 DIAGNOSIS — J019 Acute sinusitis, unspecified: Secondary | ICD-10-CM | POA: Insufficient documentation

## 2023-07-08 DIAGNOSIS — W010XXA Fall on same level from slipping, tripping and stumbling without subsequent striking against object, initial encounter: Secondary | ICD-10-CM | POA: Insufficient documentation

## 2023-07-08 DIAGNOSIS — Y92481 Parking lot as the place of occurrence of the external cause: Secondary | ICD-10-CM | POA: Diagnosis not present

## 2023-07-08 DIAGNOSIS — M25512 Pain in left shoulder: Secondary | ICD-10-CM | POA: Diagnosis not present

## 2023-07-08 DIAGNOSIS — Z043 Encounter for examination and observation following other accident: Secondary | ICD-10-CM | POA: Diagnosis not present

## 2023-07-08 DIAGNOSIS — M542 Cervicalgia: Secondary | ICD-10-CM | POA: Insufficient documentation

## 2023-07-08 DIAGNOSIS — M25562 Pain in left knee: Secondary | ICD-10-CM | POA: Diagnosis not present

## 2023-07-08 DIAGNOSIS — M25561 Pain in right knee: Secondary | ICD-10-CM | POA: Diagnosis not present

## 2023-07-08 DIAGNOSIS — S6981XA Other specified injuries of right wrist, hand and finger(s), initial encounter: Secondary | ICD-10-CM | POA: Diagnosis not present

## 2023-07-08 DIAGNOSIS — M79631 Pain in right forearm: Secondary | ICD-10-CM | POA: Diagnosis not present

## 2023-07-08 DIAGNOSIS — M25531 Pain in right wrist: Secondary | ICD-10-CM | POA: Diagnosis not present

## 2023-07-08 DIAGNOSIS — M1711 Unilateral primary osteoarthritis, right knee: Secondary | ICD-10-CM | POA: Diagnosis not present

## 2023-07-08 DIAGNOSIS — M5030 Other cervical disc degeneration, unspecified cervical region: Secondary | ICD-10-CM | POA: Diagnosis not present

## 2023-07-08 DIAGNOSIS — M85812 Other specified disorders of bone density and structure, left shoulder: Secondary | ICD-10-CM | POA: Diagnosis not present

## 2023-07-08 DIAGNOSIS — W19XXXA Unspecified fall, initial encounter: Secondary | ICD-10-CM

## 2023-07-08 DIAGNOSIS — M1712 Unilateral primary osteoarthritis, left knee: Secondary | ICD-10-CM | POA: Diagnosis not present

## 2023-07-08 DIAGNOSIS — R062 Wheezing: Secondary | ICD-10-CM | POA: Insufficient documentation

## 2023-07-08 DIAGNOSIS — R519 Headache, unspecified: Secondary | ICD-10-CM | POA: Diagnosis present

## 2023-07-08 MED ORDER — IPRATROPIUM BROMIDE 0.06 % NA SOLN: 2.0000 | Freq: Four times a day (QID) | NASAL | 12 refills | Status: AC

## 2023-07-08 MED ORDER — ALBUTEROL SULFATE HFA 108 (90 BASE) MCG/ACT IN AERS
1.0000 | INHALATION_SPRAY | Freq: Once | RESPIRATORY_TRACT | Status: DC
  Filled 2023-07-08: qty 6.7

## 2023-07-08 MED ORDER — IPRATROPIUM-ALBUTEROL 0.5-2.5 (3) MG/3ML IN SOLN
3.0000 mL | Freq: Once | RESPIRATORY_TRACT | Status: AC
  Administered 2023-07-08: 3 mL via RESPIRATORY_TRACT
  Filled 2023-07-08: qty 3

## 2023-07-08 MED ORDER — CLINDAMYCIN HCL 300 MG PO CAPS: 300.0000 mg | ORAL_CAPSULE | Freq: Three times a day (TID) | ORAL | 0 refills | Status: AC

## 2023-07-08 MED ORDER — DEXAMETHASONE 4 MG PO TABS
10.0000 mg | ORAL_TABLET | Freq: Once | ORAL | Status: AC
  Administered 2023-07-08: 10 mg via ORAL
  Filled 2023-07-08: qty 3

## 2023-07-08 MED ORDER — CLINDAMYCIN HCL 300 MG PO CAPS: 300.0000 mg | ORAL_CAPSULE | Freq: Three times a day (TID) | ORAL | 0 refills | Status: DC
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
## 2023-07-08 NOTE — Discharge Instructions (Addendum)
As discussed, imaging studies negative for any fracture, dislocation, brain bleed or other abnormality from the fall.  CT scan did show evidence of continued inflammation of your sinuses concerning for sinus infection.  Will treat this with antibiotics given how long he been symptomatic for. Regarding the wheezing, will give you a dose of steroid today; recommend not resuming your prednisone until Monday.  Continue use inhaler at home for wheeze.  Recommend follow-up with orthopedics regarding your right hand pain as there is some concern for underlying scaphoid fracture.  Please do not hesitate to return if the worrisome signs and symptoms we discussed become apparent.

## 2023-07-08 NOTE — ED Triage Notes (Signed)
Pt via pov from home with left shoulder, right arm, and bilateral knee pain after a fall. She tripped over a concrete barrier in a parking lot. Denies LOC or hitting her head. Pt alert & oriented, nad noted.

## 2023-07-14 ENCOUNTER — Ambulatory Visit: Payer: Medicare PPO

## 2023-07-19 ENCOUNTER — Telehealth: Payer: Self-pay

## 2023-07-19 ENCOUNTER — Other Ambulatory Visit: Payer: Self-pay | Admitting: Podiatry

## 2023-07-19 DIAGNOSIS — J329 Chronic sinusitis, unspecified: Secondary | ICD-10-CM | POA: Diagnosis not present

## 2023-07-19 DIAGNOSIS — J209 Acute bronchitis, unspecified: Secondary | ICD-10-CM | POA: Diagnosis not present

## 2023-07-19 DIAGNOSIS — J309 Allergic rhinitis, unspecified: Secondary | ICD-10-CM | POA: Diagnosis not present

## 2023-07-19 DIAGNOSIS — R062 Wheezing: Secondary | ICD-10-CM | POA: Diagnosis not present

## 2023-07-19 MED ORDER — CEPHALEXIN 500 MG PO CAPS: 500.0000 mg | ORAL_CAPSULE | Freq: Three times a day (TID) | ORAL | 0 refills | Status: DC

## 2023-07-19 NOTE — Telephone Encounter (Signed)
Patient called and left a message. Her ingrown toenail has come back with a vengeance and is now infected, She is asking for antibiotics and to schedule an appointment to have it removed.

## 2023-07-22 ENCOUNTER — Ambulatory Visit: Payer: Medicare PPO | Admitting: Podiatry

## 2023-07-29 ENCOUNTER — Ambulatory Visit
Admission: RE | Admit: 2023-07-29 | Discharge: 2023-07-29 | Disposition: A | Discharge status: Home / Self Care | Payer: Medicare PPO | Source: Ambulatory Visit | Attending: Internal Medicine | Admitting: Internal Medicine

## 2023-07-29 DIAGNOSIS — Z1231 Encounter for screening mammogram for malignant neoplasm of breast: Secondary | ICD-10-CM | POA: Diagnosis not present

## 2023-08-09 ENCOUNTER — Telehealth (INDEPENDENT_AMBULATORY_CARE_PROVIDER_SITE_OTHER): Payer: Self-pay | Admitting: Otolaryngology

## 2023-08-09 NOTE — Telephone Encounter (Signed)
Confirmed appt & location [id number] afm

## 2023-08-10 ENCOUNTER — Ambulatory Visit (INDEPENDENT_AMBULATORY_CARE_PROVIDER_SITE_OTHER): Payer: Medicare PPO

## 2023-08-10 VITALS — BP 111/78 | Ht 64.0 in | Wt 134.0 lb

## 2023-08-10 DIAGNOSIS — J323 Chronic sphenoidal sinusitis: Secondary | ICD-10-CM | POA: Diagnosis not present

## 2023-08-10 DIAGNOSIS — J343 Hypertrophy of nasal turbinates: Secondary | ICD-10-CM | POA: Diagnosis not present

## 2023-08-10 DIAGNOSIS — R040 Epistaxis: Secondary | ICD-10-CM

## 2023-08-10 DIAGNOSIS — R0981 Nasal congestion: Secondary | ICD-10-CM | POA: Diagnosis not present

## 2023-08-10 DIAGNOSIS — J310 Chronic rhinitis: Secondary | ICD-10-CM

## 2023-08-10 DIAGNOSIS — J342 Deviated nasal septum: Secondary | ICD-10-CM | POA: Diagnosis not present

## 2023-08-11 DIAGNOSIS — J343 Hypertrophy of nasal turbinates: Secondary | ICD-10-CM | POA: Insufficient documentation

## 2023-08-11 DIAGNOSIS — J310 Chronic rhinitis: Secondary | ICD-10-CM | POA: Insufficient documentation

## 2023-08-11 DIAGNOSIS — J323 Chronic sphenoidal sinusitis: Secondary | ICD-10-CM | POA: Insufficient documentation

## 2023-08-11 DIAGNOSIS — R040 Epistaxis: Secondary | ICD-10-CM | POA: Insufficient documentation

## 2023-08-11 DIAGNOSIS — J342 Deviated nasal septum: Secondary | ICD-10-CM | POA: Insufficient documentation

## 2023-08-11 NOTE — Progress Notes (Signed)
Patient ID: Alexa Rios, female   DOB: 1946-04-26, 78 y.o.   MRN: [id number]  Follow-up: Chronic nasal congestion, recurrent facial pain, sphenoid sinusitis New complaint: Recurrent left epistaxis  HPI: The patient is a 78 year old female who returns today for follow-up evaluation.  The patient was previously seen for chronic nasal congestion and recurrent facial pain.  At her last visit in August 2024, she was noted to have nasal mucosal congestion, nasal septal deviation, and bilateral inferior turbinate hypertrophy.  She was treated with Flonase nasal spray and nasal saline irrigation.  According to the patient, she was recently diagnosed with bronchitis and left sphenoid sinusitis.  Her head CT scan showed air-fluid level within the left sphenoid sinus.  She was treated with 2 courses of antibiotics, prednisone, and Flonase nasal spray.  Despite the treatment, she continues to have chronic nasal obstruction and left-sided facial pain.  In addition, she also complains of recurrent left epistaxis for the past 3 weeks.  She has had multiple bleeding episodes each week.  She denies any recent nasal trauma.  Exam: General: Communicates without difficulty, well nourished, no acute distress. Head: Normocephalic, no evidence injury, no tenderness, facial buttresses intact without stepoff. Face/sinus: No tenderness to palpation and percussion. Facial movement is normal and symmetric. Eyes: PERRL, EOMI. No scleral icterus, conjunctivae clear. Neuro: CN II exam reveals vision grossly intact.  No nystagmus at any point of gaze. Ears: Auricles well formed without lesions.  Ear canals are intact without mass or lesion.  No erythema or edema is appreciated.  The TMs are intact without fluid. Nose: External evaluation reveals normal support and skin without lesions.  Dorsum is intact.  Anterior rhinoscopy reveals congested mucosa over anterior aspect of inferior turbinates and deviated septum.  Hypervascular  areas are noted on the left nasal septum.  Oral:  Oral cavity and oropharynx are intact, symmetric, without erythema or edema.  Mucosa is moist without lesions. Neck: Full range of motion without pain.  There is no significant lymphadenopathy.  No masses palpable.  Thyroid bed within normal limits to palpation.  Parotid glands and submandibular glands equal bilaterally without mass.  Trachea is midline. Neuro:  CN 2-12 grossly intact.   Procedure:  Endoscopic control of recurrent left epistaxis. Indication:  Recurrent epistaxis  Description:  The left nasal cavity is sprayed with topical xylocaine and neo-synephrine.  After adequate anesthesia is achieved, the nasal cavity is examined with a 0 rigid endoscope.  A suction catheter is inserted into parallel with the 0 endoscope, and it is used to suction blood clots from the nasal cavity.  Several hypervascular areas are noted on the anterior and superior portion of the septum.  Significant nasal septal deviation and left septal spur are noted.  The left septal spur is noted to be touching the left inferior turbinate.  A silver nitrate stick is inserted in parallel with the 0 endoscope.  It is used to repeatedly cauterized the hypervascular areas.  Good hemostasis is achieved.  The patient tolerated the procedure well.    Assessment: 1.  Chronic rhinitis with nasal mucosal congestion, nasal septal deviation, and bilateral inferior turbinate hypertrophy.  A large left septal spur is noted to be touching the left inferior turbinate. 2.  More than 95% of her nasal passageways are obstructed.  The patient has no responded to treatment with Flonase nasal sprays and allergy medications. 3.  Recurrent left epistaxis.  Hypervascular areas are noted on the left anterior and superior nasal septum.  Plan: 1.  The physical exam and nasal endoscopy findings are reviewed with the patient. 2.  Endoscopic cauterization of the left nasal septum. 3.  Continue with  Flonase nasal spray 2 sprays each nostril daily.  The proper technique of using the nasal spray is demonstrated. 4.  Humidifier/nasal ointment during the winter months. 5.  In light of her persistent nasal obstruction, she may benefit from surgical intervention with septoplasty and bilateral turbinate reduction.  The risk, benefits, and details of the procedures are reviewed.  Questions are invited and answered. 6.  The patient would like to proceed with the procedures.

## 2023-08-16 DIAGNOSIS — L578 Other skin changes due to chronic exposure to nonionizing radiation: Secondary | ICD-10-CM | POA: Diagnosis not present

## 2023-08-16 DIAGNOSIS — D485 Neoplasm of uncertain behavior of skin: Secondary | ICD-10-CM | POA: Diagnosis not present

## 2023-09-19 DIAGNOSIS — L720 Epidermal cyst: Secondary | ICD-10-CM | POA: Diagnosis not present

## 2023-09-19 DIAGNOSIS — L570 Actinic keratosis: Secondary | ICD-10-CM | POA: Diagnosis not present

## 2023-09-23 ENCOUNTER — Other Ambulatory Visit: Payer: Self-pay | Admitting: Internal Medicine

## 2023-09-28 DIAGNOSIS — M79601 Pain in right arm: Secondary | ICD-10-CM | POA: Diagnosis not present

## 2023-09-28 DIAGNOSIS — M5481 Occipital neuralgia: Secondary | ICD-10-CM | POA: Diagnosis not present

## 2023-09-28 DIAGNOSIS — E039 Hypothyroidism, unspecified: Secondary | ICD-10-CM | POA: Diagnosis not present

## 2023-09-28 DIAGNOSIS — G43919 Migraine, unspecified, intractable, without status migrainosus: Secondary | ICD-10-CM | POA: Diagnosis not present

## 2023-09-28 DIAGNOSIS — M51369 Other intervertebral disc degeneration, lumbar region without mention of lumbar back pain or lower extremity pain: Secondary | ICD-10-CM | POA: Diagnosis not present

## 2023-09-28 DIAGNOSIS — R519 Headache, unspecified: Secondary | ICD-10-CM | POA: Diagnosis not present

## 2023-10-12 DIAGNOSIS — M5416 Radiculopathy, lumbar region: Secondary | ICD-10-CM | POA: Diagnosis not present

## 2023-10-12 DIAGNOSIS — M47812 Spondylosis without myelopathy or radiculopathy, cervical region: Secondary | ICD-10-CM | POA: Diagnosis not present

## 2023-10-12 DIAGNOSIS — M542 Cervicalgia: Secondary | ICD-10-CM | POA: Diagnosis not present

## 2023-10-12 DIAGNOSIS — M48061 Spinal stenosis, lumbar region without neurogenic claudication: Secondary | ICD-10-CM | POA: Diagnosis not present

## 2023-10-17 DIAGNOSIS — M48061 Spinal stenosis, lumbar region without neurogenic claudication: Secondary | ICD-10-CM | POA: Diagnosis not present

## 2023-10-17 DIAGNOSIS — M5416 Radiculopathy, lumbar region: Secondary | ICD-10-CM | POA: Diagnosis not present

## 2023-10-17 DIAGNOSIS — M5116 Intervertebral disc disorders with radiculopathy, lumbar region: Secondary | ICD-10-CM | POA: Diagnosis not present

## 2023-10-17 DIAGNOSIS — M4726 Other spondylosis with radiculopathy, lumbar region: Secondary | ICD-10-CM | POA: Diagnosis not present

## 2023-10-19 DIAGNOSIS — M19011 Primary osteoarthritis, right shoulder: Secondary | ICD-10-CM | POA: Diagnosis not present

## 2023-10-25 ENCOUNTER — Ambulatory Visit (INDEPENDENT_AMBULATORY_CARE_PROVIDER_SITE_OTHER): Payer: Medicare PPO

## 2023-11-05 DIAGNOSIS — N760 Acute vaginitis: Secondary | ICD-10-CM | POA: Diagnosis not present

## 2023-11-12 DIAGNOSIS — M25511 Pain in right shoulder: Secondary | ICD-10-CM | POA: Diagnosis not present

## 2023-11-13 DIAGNOSIS — N760 Acute vaginitis: Secondary | ICD-10-CM | POA: Diagnosis not present

## 2023-11-13 DIAGNOSIS — R300 Dysuria: Secondary | ICD-10-CM | POA: Diagnosis not present

## 2023-11-13 DIAGNOSIS — N898 Other specified noninflammatory disorders of vagina: Secondary | ICD-10-CM | POA: Diagnosis not present

## 2023-11-14 DIAGNOSIS — B3731 Acute candidiasis of vulva and vagina: Secondary | ICD-10-CM | POA: Diagnosis not present

## 2023-11-14 DIAGNOSIS — R590 Localized enlarged lymph nodes: Secondary | ICD-10-CM | POA: Diagnosis not present

## 2023-11-14 DIAGNOSIS — R6883 Chills (without fever): Secondary | ICD-10-CM | POA: Diagnosis not present

## 2023-11-15 ENCOUNTER — Other Ambulatory Visit: Payer: Self-pay | Admitting: Family Medicine

## 2023-11-15 ENCOUNTER — Ambulatory Visit
Admission: RE | Admit: 2023-11-15 | Discharge: 2023-11-15 | Disposition: A | Discharge status: Home / Self Care | Source: Ambulatory Visit | Attending: Family Medicine | Admitting: Family Medicine

## 2023-11-15 ENCOUNTER — Encounter: Payer: Self-pay | Admitting: Family Medicine

## 2023-11-15 DIAGNOSIS — R1909 Other intra-abdominal and pelvic swelling, mass and lump: Secondary | ICD-10-CM | POA: Diagnosis not present

## 2023-11-15 DIAGNOSIS — R590 Localized enlarged lymph nodes: Secondary | ICD-10-CM

## 2023-11-22 DIAGNOSIS — M7541 Impingement syndrome of right shoulder: Secondary | ICD-10-CM | POA: Diagnosis not present

## 2023-11-22 DIAGNOSIS — M75111 Incomplete rotator cuff tear or rupture of right shoulder, not specified as traumatic: Secondary | ICD-10-CM | POA: Diagnosis not present

## 2023-12-01 DIAGNOSIS — B0089 Other herpesviral infection: Secondary | ICD-10-CM | POA: Diagnosis not present

## 2023-12-01 DIAGNOSIS — D485 Neoplasm of uncertain behavior of skin: Secondary | ICD-10-CM | POA: Diagnosis not present

## 2023-12-01 DIAGNOSIS — L738 Other specified follicular disorders: Secondary | ICD-10-CM | POA: Diagnosis not present

## 2023-12-05 DIAGNOSIS — M25511 Pain in right shoulder: Secondary | ICD-10-CM | POA: Diagnosis not present

## 2024-01-08 ENCOUNTER — Other Ambulatory Visit: Payer: Self-pay

## 2024-01-08 ENCOUNTER — Emergency Department (HOSPITAL_BASED_OUTPATIENT_CLINIC_OR_DEPARTMENT_OTHER)
Admission: EM | Admit: 2024-01-08 | Discharge: 2024-01-08 | Disposition: A | Discharge status: Home / Self Care | Attending: Emergency Medicine | Admitting: Emergency Medicine

## 2024-01-08 ENCOUNTER — Encounter (HOSPITAL_BASED_OUTPATIENT_CLINIC_OR_DEPARTMENT_OTHER): Payer: Self-pay | Admitting: Emergency Medicine

## 2024-01-08 ENCOUNTER — Emergency Department (HOSPITAL_BASED_OUTPATIENT_CLINIC_OR_DEPARTMENT_OTHER)

## 2024-01-08 DIAGNOSIS — R519 Headache, unspecified: Secondary | ICD-10-CM | POA: Diagnosis not present

## 2024-01-08 DIAGNOSIS — H538 Other visual disturbances: Secondary | ICD-10-CM | POA: Insufficient documentation

## 2024-01-08 DIAGNOSIS — R110 Nausea: Secondary | ICD-10-CM | POA: Diagnosis not present

## 2024-01-08 DIAGNOSIS — G8929 Other chronic pain: Secondary | ICD-10-CM | POA: Diagnosis not present

## 2024-01-08 LAB — BASIC METABOLIC PANEL WITH GFR
Anion gap: 13 (ref 5–15)
BUN: 12 mg/dL (ref 8–23)
CO2: 24 mmol/L (ref 22–32)
Calcium: 9.6 mg/dL (ref 8.9–10.3)
Chloride: 101 mmol/L (ref 98–111)
Creatinine, Ser: 0.68 mg/dL (ref 0.44–1.00)
GFR, Estimated: >60 mL/min (ref >60)
Glucose, Bld: 75 mg/dL (ref 70–99)
Potassium: 3.7 mmol/L (ref 3.5–5.1)
Sodium: 138 mmol/L (ref 135–145)

## 2024-01-08 LAB — CBC WITH DIFFERENTIAL/PLATELET
Abs Immature Granulocytes: 0.01 10*3/uL (ref 0.00–0.07)
Basophils Absolute: 0.1 10*3/uL (ref 0.0–0.1)
Basophils Relative: 1 %
Eosinophils Absolute: 0.1 10*3/uL (ref 0.0–0.5)
Eosinophils Relative: 1 %
HCT: 37.1 % (ref 36.0–46.0)
Hemoglobin: 12.3 g/dL (ref 12.0–15.0)
Immature Granulocytes: 0 %
Lymphocytes Relative: 29 %
Lymphs Abs: 1.7 10*3/uL (ref 0.7–4.0)
MCH: 32.2 pg (ref 26.0–34.0)
MCHC: 33.2 g/dL (ref 30.0–36.0)
MCV: 97.1 fL (ref 80.0–100.0)
Monocytes Absolute: 0.6 10*3/uL (ref 0.1–1.0)
Monocytes Relative: 11 %
Neutro Abs: 3.4 10*3/uL (ref 1.7–7.7)
Neutrophils Relative %: 58 %
Platelets: 204 10*3/uL (ref 150–400)
RBC: 3.82 MIL/uL — ABNORMAL LOW (ref 3.87–5.11)
RDW: 12.9 % (ref 11.5–15.5)
WBC: 5.8 10*3/uL (ref 4.0–10.5)
nRBC: 0 % (ref 0.0–0.2)

## 2024-01-08 LAB — URINALYSIS, ROUTINE W REFLEX MICROSCOPIC
Bacteria, UA: NONE SEEN
Bilirubin Urine: NEGATIVE
Glucose, UA: NEGATIVE mg/dL
Ketones, ur: NEGATIVE mg/dL
Leukocytes,Ua: NEGATIVE
Nitrite: NEGATIVE
Protein, ur: NEGATIVE mg/dL
Specific Gravity, Urine: <1.005 — ABNORMAL LOW (ref 1.005–1.030)
pH: 5.5 (ref 5.0–8.0)

## 2024-01-08 MED ORDER — DEXAMETHASONE SODIUM PHOSPHATE 10 MG/ML IJ SOLN
10.0000 mg | Freq: Once | INTRAMUSCULAR | Status: AC
  Administered 2024-01-08: 10 mg via INTRAVENOUS
  Filled 2024-01-08: qty 1

## 2024-01-08 MED ORDER — KETOROLAC TROMETHAMINE 15 MG/ML IJ SOLN
15.0000 mg | Freq: Once | INTRAMUSCULAR | Status: AC
  Administered 2024-01-08: 15 mg via INTRAVENOUS
  Filled 2024-01-08: qty 1

## 2024-01-08 MED ORDER — METOCLOPRAMIDE HCL 5 MG/ML IJ SOLN
10.0000 mg | Freq: Once | INTRAMUSCULAR | Status: AC
  Administered 2024-01-08: 10 mg via INTRAVENOUS
  Filled 2024-01-08: qty 2

## 2024-01-08 MED ORDER — LACTATED RINGERS IV BOLUS
500.0000 mL | Freq: Once | INTRAVENOUS | Status: AC
  Administered 2024-01-08: 500 mL via INTRAVENOUS

## 2024-01-08 NOTE — ED Triage Notes (Signed)
 Pt has hx headaches and occipital neuralgia, she has meds for this ,these meds have not touched her headache this week. She now has tenderness to the scalp and down to her left eye and extremely painful and has a red area to left forehead.

## 2024-01-08 NOTE — ED Notes (Signed)
 Patient transported to CT

## 2024-01-08 NOTE — ED Notes (Addendum)
 Answered call light, pt advised it does not seem like her fluids are  going into her IV, advised pt it is and if it was any issue with the fluids going on the IV pump would beep.

## 2024-01-08 NOTE — ED Notes (Signed)
 FALL RISK BUNDLE IS CURRENTLY IN PLACE

## 2024-01-08 NOTE — ED Provider Notes (Signed)
 [place] [facility] AT [facility] Provider Note   CSN: [id number] Arrival date & time: 01/08/24  1229     Patient presents with: Headache and Facial Pain   Alexa Rios is a 78 y.o. female.    Headache Patient is a 78-year-old female presents the ED today with complaints of a 3-day history of left-sided headache, described as pulsatile, noting pain behind left eye as well.  States that this feels similar to previous migraines however is unabating.  Has been using gabapentin  as well as tramadol .  Previous medical history of occipital neuralgia, migraines.  Noted to have a appointment with neurology on 7/7.  States that this began when she dermaplaned a scaly lesion on her left forehead.  States that since then she has noticed increasing headaches accompanied with mild bilateral blurry vision and nausea.  Notes that she also has not been hydrating well since headaches have started and also notes sleep aberration due to the headache.  Denies fever, tinnitus, vertigo, sore throat, chest pain, shortness of breath, abdominal pain, vomiting, diarrhea, dysuria, lower leg swelling.     Prior to Admission medications   Medication Sig Start Date End Date Taking? Authorizing Provider  cephALEXin  (KEFLEX ) 500 MG capsule Take 1 capsule (500 mg total) by mouth 3 (three) times daily. 07/19/23   Wagoner, Matthew R, DPM  BIOTIN 5000 PO Take 1 tablet by mouth in the morning and at bedtime.    [provider]  CALCIUM  PO Take 1,200 mg by mouth daily.    [provider]  Estradiol  1 MG/GM GEL Apply 1mg  twice a week as needed Patient taking differently: Apply 1 application  topically once a week. Apply twice weekly on Wednesday and Sundays 04/09/20   Koberlein, Junell C, MD  EVENING PRIMROSE OIL PO Take 500 mg by mouth daily.    [provider]  fluticasone (FLONASE) 50 MCG/ACT nasal spray Place 2 sprays into both nostrils daily.    [provider]  gabapentin  (NEURONTIN ) 100 MG capsule Take 1 capsule by mouth as needed. 12/06/20   [provider]  Glucosamine-Chondroitin (COSAMIN DS PO) Take 1 tablet by mouth daily.    [provider]  hydrocortisone  (ANUSOL -HC) 25 MG suppository Place 1 suppository (25 mg total) rectally 2 (two) times daily. 05/15/23   Silver Fell A, PA  ipratropium (ATROVENT ) 0.06 % nasal spray Place 2 sprays into both nostrils 4 (four) times daily. Patient not taking: Reported on 08/10/2023 07/08/23   Silver Fell DELENA, PA  levothyroxine  (SYNTHROID ) 25 MCG tablet Take 1 tablet (25 mcg total) by mouth daily before breakfast. 08/20/20   Patsy Lenis, MD  Multiple Vitamin (MULTIVITAMIN ADULT PO) Take by mouth.    [provider]  ondansetron  (ZOFRAN -ODT) 4 MG disintegrating tablet Take 1 tablet (4 mg total) by mouth every 8 (eight) hours as needed. Patient not taking: Reported on 08/10/2023 05/15/23   Silver Fell A, PA  pantoprazole  (PROTONIX ) 40 MG tablet TAKE 1-2 TABLETS BY MOUTH DAILY AS NEEDED 09/23/23   Gessner, Carl E, MD  polyethylene glycol (MIRALAX  / GLYCOLAX ) 17 g packet Take 17 g by mouth 2 (two) times daily.    [provider]  Potassium 99 MG TABS Take 1 tablet by mouth daily.    [provider]  PREVIDENT 5000 BOOSTER PLUS 1.1 % PSTE Place onto teeth. 11/26/20   [provider]  sucralfate  (CARAFATE ) 1 g tablet Take 1 tablet (1 g total) by mouth 4 (  four) times daily -  with meals and at bedtime. 04/08/23   Avram Lupita BRAVO, MD  traMADol  (ULTRAM ) 50 MG tablet Take 1 tablet (50 mg total) by mouth every 6 (six) hours as needed. 08/20/22   Ladora Congress, PA  Ubrogepant  (UBRELVY ) 50 MG TABS Take 50 mg by mouth once as needed for up to 1 dose. Patient taking differently: Take 50 mg by mouth as needed. 03/14/20   Koberlein, Junell C, MD    Allergies: Morphine , Sulfamethoxazole-trimethoprim, Sumatriptan, Carafate  [sucralfate ], and Sulfur dioxide    Review  of Systems  Neurological:  Positive for headaches.  All other systems reviewed and are negative.   Updated Vital Signs BP 124/86 (BP Location: Right Arm)   Pulse 91   Temp 97.7 F (36.5 C)   Resp 18   SpO2 98%   Physical Exam Vitals and nursing note reviewed.  Constitutional:      General: She is not in acute distress.    Appearance: Normal appearance. She is not ill-appearing.  HENT:     Head: Normocephalic and atraumatic.     Mouth/Throat:     Mouth: Mucous membranes are moist.     Pharynx: Oropharynx is clear. No oropharyngeal exudate or posterior oropharyngeal erythema.   Eyes:     General: No scleral icterus.       Right eye: No discharge.        Left eye: No discharge.     Extraocular Movements: Extraocular movements intact.     Right eye: Normal extraocular motion and no nystagmus.     Left eye: Normal extraocular motion and no nystagmus.     Conjunctiva/sclera: Conjunctivae normal.     Pupils: Pupils are equal, round, and reactive to light.     Right eye: Pupil is round and reactive.     Left eye: Pupil is round and reactive.   Neck:     Meningeal: Brudzinski's sign and Kernig's sign absent.   Cardiovascular:     Rate and Rhythm: Normal rate and regular rhythm.     Pulses: Normal pulses.     Heart sounds: Normal heart sounds. No murmur heard.    No friction rub. No gallop.  Pulmonary:     Effort: Pulmonary effort is normal. No respiratory distress.     Breath sounds: Normal breath sounds. No stridor. No wheezing, rhonchi or rales.  Chest:     Chest wall: No tenderness.  Abdominal:     General: Abdomen is flat. There is no distension.     Palpations: Abdomen is soft.     Tenderness: There is no abdominal tenderness. There is no guarding.   Musculoskeletal:        General: No swelling. Normal range of motion.     Cervical back: Normal range of motion and neck supple. No rigidity.  Lymphadenopathy:     Cervical: No cervical adenopathy.   Skin:     General: Skin is warm and dry.     Coloration: Skin is not cyanotic or pale.     Findings: No erythema or rash.     Comments: Well-healing scab on left forehead, with no erythema, no swelling, no drainage.  Is tender to palpation over the left upper forehead.   Neurological:     General: No focal deficit present.     Mental Status: She is alert and oriented to person, place, and time. Mental status is at baseline.     GCS: GCS eye subscore is 4. GCS verbal  subscore is 5. GCS motor subscore is 6.     Cranial Nerves: No cranial nerve deficit.     Sensory: No sensory deficit.     Motor: No weakness.     Coordination: Coordination normal.     Comments: No facial asymmetry, no ataxia, no apraxia, no aphasia, no arm drift, normal sensation to both upper and lower extremities bilaterally, normal strength to both flexion and extension to both upper lower extremities 5+ bilaterally, no visual field deficits, no nystagmus.   Psychiatric:        Mood and Affect: Mood normal. Mood is not anxious.     (all labs ordered are listed, but only abnormal results are displayed) Labs Reviewed  CBC WITH DIFFERENTIAL/PLATELET - Abnormal; Notable for the following components:      Result Value   RBC 3.82 (*)    All other components within normal limits  URINALYSIS, ROUTINE W REFLEX MICROSCOPIC - Abnormal; Notable for the following components:   Color, Urine COLORLESS (*)    Specific Gravity, Urine <1.005 (*)    Hgb urine dipstick SMALL (*)    All other components within normal limits  BASIC METABOLIC PANEL WITH GFR    EKG: None  Radiology: CT Head Wo Contrast Result Date: 01/08/2024 CLINICAL DATA:  Chronic headaches. EXAM: CT HEAD WITHOUT CONTRAST TECHNIQUE: Contiguous axial images were obtained from the base of the skull through the vertex without intravenous contrast. RADIATION DOSE REDUCTION: This exam was performed according to the departmental dose-optimization program which includes automated  exposure control, adjustment of the mA and/or kV according to patient size and/or use of iterative reconstruction technique. COMPARISON:  July 08, 2023 FINDINGS: Brain: No evidence of acute infarction, hemorrhage, hydrocephalus, extra-axial collection or mass lesion/mass effect. Vascular: No hyperdense vessel or unexpected calcification. Skull: Normal. Negative for fracture or focal lesion. Sinuses/Orbits: No acute finding. Other: None. IMPRESSION: No acute intracranial pathology. Electronically Signed   By: Suzen Dials M.D.   On: 01/08/2024 14:53    Procedures   Medications Ordered in the ED  lactated ringers bolus 500 mL (0 mLs Intravenous Stopped 01/08/24 1633)  dexamethasone  (DECADRON ) injection 10 mg (10 mg Intravenous Given 01/08/24 1456)  metoCLOPramide  (REGLAN ) injection 10 mg (10 mg Intravenous Given 01/08/24 1456)  ketorolac  (TORADOL ) 15 MG/ML injection 15 mg (15 mg Intravenous Given 01/08/24 1456)                                 Medical Decision Making  This patient is a 78 year old female who presents to the ED for concern of headache growing intensity over the last 3 days, noted appears medical history of possible neuralgia as well as migraines.  States that this feels similar to previous migraines but this has persisted has been accompanied with nausea as well as bilateral blurry vision which is her normal during migraines.   On physical exam, patient is in no acute distress, afebrile, alert and orient x 4, speaking in full sentences, nontachypneic, nontachycardic.  Noted to be resting comfortably, normal neuroexam.  LCTAB.  Noted to have a well-healing scab noted to left upper forehead, with no signs of infection.  Tender to palpation over the left upper forehead as well as point tenderness across left side of skull.  No rashes or lesions otherwise noted.  Unremarkable exam otherwise.  Initial labs were done as well as imaging due to headache being new and worse than her  normal headaches.  Will provide migraine cocktail today.  On reevaluation, patient notes that her symptoms have abated and greatly improved, wished to go home at this time.  Her lab work is unremarkable as well as her imaging is also unremarkable.  Low suspicion for any emergent cause of her symptoms today.  Has good follow-up with neurology as well as with PCP, will have her follow-up with her already scheduled neurology appointment.  As well as use her chronic migraine medications as already prescribed at home.  Patient vital signs have remained stable throughout the course of patient's time in the ED. Low suspicion for any other emergent pathology at this time. I believe this patient is safe to be discharged. Provided strict return to ER precautions. Patient expressed agreement and understanding of plan. All questions were answered.  Differential diagnoses prior to evaluation: The emergent differential diagnosis includes, but is not limited to, tension headache, migraine, polypharmacy, substance abuse, sinusitis, cervicogenic headache, dehydration, cluster headache, trigeminal neuralgia, IIH, PRES syndrome, intracranial bleed, CVA, occipital neuralgia, shingles. This is not an exhaustive differential.   Past Medical History / Co-morbidities / Social History: Migraine, mesenteric adenitis, Paget's disease, diverticulitis, Candida esophagitis, GERD, bilateral occipital neuralgia.  Status post cholecystectomy, hysterectomy  Additional history: Chart reviewed. Pertinent results include:   Seen by [facility] on 12/06/2023 for right shoulder pain  Noted to have a recent US  pelvic ultrasound done on 11/15/2023 showing 1.4 x 1.2 cm probable lipoma.  Lab Tests/Imaging studies: I personally interpreted labs/imaging and the pertinent results include:   CBC unremarkable BMP unremarkable UA shows possible overhydration from colorless urine and decreased gravity CT head unremarkable    I agree with  the radiologist interpretation.    Medications: I ordered medication including LR, Decadron , Toradol , Reglan .  I have reviewed the patients home medicines and have made adjustments as needed.  Critical Interventions: none  Social Determinants of Health: Patient notably has good follow-up with neurologist on July 7 as well as good follow-up with PCP  Disposition: After consideration of the diagnostic results and the patients response to treatment, I feel that the patient would benefit from discharge and treatment as above.   emergency department workup does not suggest an emergent condition requiring admission or immediate intervention beyond what has been performed at this time. The plan is: Follow-up with neurology with already scheduled appointment, continue to control headache with home medications already prescribed, return to the ED for any new or worsening symptoms. The patient is safe for discharge and has been instructed to return immediately for worsening symptoms, change in symptoms or any other concerns.    Final diagnoses:  Acute nonintractable headache, unspecified headache type    ED Discharge Orders     None          Beola Terrall GORMAN DEVONNA 01/08/24 1640    Geraldene Hamilton, MD 01/09/24 2034

## 2024-01-08 NOTE — ED Notes (Signed)
 ED Provider at bedside.

## 2024-01-08 NOTE — Discharge Instructions (Addendum)
 You were seen today for acute headache.  You provided Toradol , Decadron , Reglan  today and had relief of symptoms.  Recommend you continue to follow-up with your neurology appointment on 7 July as well as continue to follow with PCP as needed for any further headache relief.  Please return to the ED if you begin to have any new or worsening symptoms which include one-sided weakness, confusion, uncontrollable pain, vomiting, shortness of breath or chest pain

## 2024-01-18 ENCOUNTER — Other Ambulatory Visit: Payer: Self-pay | Admitting: Family Medicine

## 2024-01-18 DIAGNOSIS — R110 Nausea: Secondary | ICD-10-CM | POA: Diagnosis not present

## 2024-01-18 DIAGNOSIS — K5792 Diverticulitis of intestine, part unspecified, without perforation or abscess without bleeding: Secondary | ICD-10-CM | POA: Diagnosis not present

## 2024-01-18 DIAGNOSIS — R109 Unspecified abdominal pain: Secondary | ICD-10-CM | POA: Diagnosis not present

## 2024-01-18 DIAGNOSIS — R197 Diarrhea, unspecified: Secondary | ICD-10-CM | POA: Diagnosis not present

## 2024-01-19 ENCOUNTER — Encounter: Payer: Self-pay | Admitting: Radiology

## 2024-01-19 ENCOUNTER — Ambulatory Visit
Admission: RE | Admit: 2024-01-19 | Discharge: 2024-01-19 | Disposition: A | Discharge status: Home / Self Care | Source: Ambulatory Visit | Attending: Family Medicine | Admitting: Family Medicine

## 2024-01-19 DIAGNOSIS — K5730 Diverticulosis of large intestine without perforation or abscess without bleeding: Secondary | ICD-10-CM | POA: Diagnosis not present

## 2024-01-19 DIAGNOSIS — I700 Atherosclerosis of aorta: Secondary | ICD-10-CM | POA: Diagnosis not present

## 2024-01-19 DIAGNOSIS — R109 Unspecified abdominal pain: Secondary | ICD-10-CM

## 2024-01-19 DIAGNOSIS — R197 Diarrhea, unspecified: Secondary | ICD-10-CM | POA: Diagnosis not present

## 2024-01-19 MED ORDER — IOPAMIDOL (ISOVUE-300) INJECTION 61%
100.0000 mL | Freq: Once | INTRAVENOUS | Status: AC | PRN
  Administered 2024-01-19: 100 mL via INTRAVENOUS

## 2024-01-23 DIAGNOSIS — M792 Neuralgia and neuritis, unspecified: Secondary | ICD-10-CM | POA: Diagnosis not present

## 2024-01-23 DIAGNOSIS — Z8669 Personal history of other diseases of the nervous system and sense organs: Secondary | ICD-10-CM | POA: Diagnosis not present

## 2024-01-23 DIAGNOSIS — G4486 Cervicogenic headache: Secondary | ICD-10-CM | POA: Diagnosis not present

## 2024-01-23 DIAGNOSIS — G43109 Migraine with aura, not intractable, without status migrainosus: Secondary | ICD-10-CM | POA: Diagnosis not present

## 2024-01-23 DIAGNOSIS — R519 Headache, unspecified: Secondary | ICD-10-CM | POA: Diagnosis not present

## 2024-01-30 DIAGNOSIS — M5416 Radiculopathy, lumbar region: Secondary | ICD-10-CM | POA: Diagnosis not present

## 2024-01-30 DIAGNOSIS — M48061 Spinal stenosis, lumbar region without neurogenic claudication: Secondary | ICD-10-CM | POA: Diagnosis not present

## 2024-01-30 DIAGNOSIS — M5116 Intervertebral disc disorders with radiculopathy, lumbar region: Secondary | ICD-10-CM | POA: Diagnosis not present

## 2024-01-30 DIAGNOSIS — M4726 Other spondylosis with radiculopathy, lumbar region: Secondary | ICD-10-CM | POA: Diagnosis not present

## 2024-02-06 DIAGNOSIS — M792 Neuralgia and neuritis, unspecified: Secondary | ICD-10-CM | POA: Diagnosis not present

## 2024-02-06 DIAGNOSIS — R519 Headache, unspecified: Secondary | ICD-10-CM | POA: Diagnosis not present

## 2024-02-08 DIAGNOSIS — I773 Arterial fibromuscular dysplasia: Secondary | ICD-10-CM | POA: Diagnosis not present

## 2024-02-21 ENCOUNTER — Other Ambulatory Visit: Payer: Self-pay

## 2024-02-21 ENCOUNTER — Emergency Department (HOSPITAL_BASED_OUTPATIENT_CLINIC_OR_DEPARTMENT_OTHER)
Admission: EM | Admit: 2024-02-21 | Discharge: 2024-02-21 | Disposition: A | Discharge status: Home / Self Care | Attending: Emergency Medicine | Admitting: Emergency Medicine

## 2024-02-21 ENCOUNTER — Emergency Department (HOSPITAL_BASED_OUTPATIENT_CLINIC_OR_DEPARTMENT_OTHER)

## 2024-02-21 DIAGNOSIS — K838 Other specified diseases of biliary tract: Secondary | ICD-10-CM | POA: Diagnosis not present

## 2024-02-21 DIAGNOSIS — K7689 Other specified diseases of liver: Secondary | ICD-10-CM | POA: Diagnosis not present

## 2024-02-21 DIAGNOSIS — K5792 Diverticulitis of intestine, part unspecified, without perforation or abscess without bleeding: Secondary | ICD-10-CM

## 2024-02-21 DIAGNOSIS — K5750 Diverticulosis of both small and large intestine without perforation or abscess without bleeding: Secondary | ICD-10-CM | POA: Diagnosis not present

## 2024-02-21 DIAGNOSIS — K5732 Diverticulitis of large intestine without perforation or abscess without bleeding: Secondary | ICD-10-CM | POA: Insufficient documentation

## 2024-02-21 DIAGNOSIS — R1032 Left lower quadrant pain: Secondary | ICD-10-CM | POA: Diagnosis not present

## 2024-02-21 DIAGNOSIS — N281 Cyst of kidney, acquired: Secondary | ICD-10-CM | POA: Diagnosis not present

## 2024-02-21 LAB — URINALYSIS, ROUTINE W REFLEX MICROSCOPIC
Bacteria, UA: NONE SEEN
Bilirubin Urine: NEGATIVE
Glucose, UA: NEGATIVE mg/dL
Ketones, ur: NEGATIVE mg/dL
Leukocytes,Ua: NEGATIVE
Nitrite: NEGATIVE
Protein, ur: NEGATIVE mg/dL
Specific Gravity, Urine: 1.005 (ref 1.005–1.030)
pH: 6.5 (ref 5.0–8.0)

## 2024-02-21 LAB — CBC
HCT: 37.4 % (ref 36.0–46.0)
Hemoglobin: 12.5 g/dL (ref 12.0–15.0)
MCH: 32.6 pg (ref 26.0–34.0)
MCHC: 33.4 g/dL (ref 30.0–36.0)
MCV: 97.7 fL (ref 80.0–100.0)
Platelets: 197 K/uL (ref 150–400)
RBC: 3.83 MIL/uL — ABNORMAL LOW (ref 3.87–5.11)
RDW: 12.8 % (ref 11.5–15.5)
WBC: 5.3 K/uL (ref 4.0–10.5)
nRBC: 0 % (ref 0.0–0.2)

## 2024-02-21 LAB — COMPREHENSIVE METABOLIC PANEL WITH GFR
ALT: 22 U/L (ref 0–44)
AST: 27 U/L (ref 15–41)
Albumin: 4.3 g/dL (ref 3.5–5.0)
Alkaline Phosphatase: 104 U/L (ref 38–126)
Anion gap: 12 (ref 5–15)
BUN: 11 mg/dL (ref 8–23)
CO2: 24 mmol/L (ref 22–32)
Calcium: 9.9 mg/dL (ref 8.9–10.3)
Chloride: 101 mmol/L (ref 98–111)
Creatinine, Ser: 0.69 mg/dL (ref 0.44–1.00)
GFR, Estimated: >60 mL/min (ref >60)
Glucose, Bld: 93 mg/dL (ref 70–99)
Potassium: 4.3 mmol/L (ref 3.5–5.1)
Sodium: 137 mmol/L (ref 135–145)
Total Bilirubin: 0.4 mg/dL (ref 0.0–1.2)
Total Protein: 6.9 g/dL (ref 6.5–8.1)

## 2024-02-21 LAB — LIPASE, BLOOD: Lipase: 23 U/L (ref 11–51)

## 2024-02-21 MED ORDER — AMOXICILLIN-POT CLAVULANATE 875-125 MG PO TABS: 1.0000 | ORAL_TABLET | Freq: Two times a day (BID) | ORAL | 0 refills | Status: DC

## 2024-02-21 MED ORDER — ONDANSETRON HCL 4 MG/2ML IJ SOLN
4.0000 mg | Freq: Three times a day (TID) | INTRAMUSCULAR | Status: DC | PRN
  Administered 2024-02-21: 4 mg via INTRAVENOUS
  Filled 2024-02-21: qty 2

## 2024-02-21 MED ORDER — IOHEXOL 300 MG/ML  SOLN
100.0000 mL | Freq: Once | INTRAMUSCULAR | Status: AC | PRN
  Administered 2024-02-21: 100 mL via INTRAVENOUS

## 2024-02-21 MED ORDER — SODIUM CHLORIDE 0.9 % IV BOLUS
1000.0000 mL | Freq: Once | INTRAVENOUS | Status: AC
  Administered 2024-02-21: 1000 mL via INTRAVENOUS

## 2024-02-21 MED ORDER — ONDANSETRON 4 MG PO TBDP: 4.0000 mg | ORAL_TABLET | Freq: Three times a day (TID) | ORAL | 0 refills | Status: AC | PRN

## 2024-02-21 MED ORDER — FENTANYL CITRATE PF 50 MCG/ML IJ SOSY
50.0000 ug | PREFILLED_SYRINGE | Freq: Once | INTRAMUSCULAR | Status: DC
  Filled 2024-02-21: qty 1

## 2024-02-21 NOTE — Discharge Instructions (Addendum)
 Your CT imaging showed evidence of sigmoid diverticulitis without complication.  Recommend you take Augmentin  for management, continue with tramadol  at home for pain control, Zofran  has been prescribed for nausea as needed.  Continue to orally rehydrate.  Return for any severe worsening symptoms otherwise follow-up with your PCP.

## 2024-02-21 NOTE — ED Provider Notes (Signed)
 [facility] Provider Note   CSN: [id number] Arrival date & time: 02/21/24  9075     Patient presents with: Abdominal Pain   Alexa Rios is a 78 y.o. female.    Abdominal Pain Associated symptoms: chills and nausea      78 year old female with medical history significant for diverticulitis presenting to the emergency department with left lower quadrant abdominal pain.  The patient has had sharp pain in her left lower quadrant with associated nausea and chills since this past Saturday.  She has had intermittent fluctuations of diarrhea and constipation over the past few weeks.  She states that her symptoms feel similar to the last time she had to be hospitalized for diverticulitis.  She endorses dysuria, denies frequency.  She denies fevers.  Her last bowel movement was this morning and was watery, no blood in her stool.    Prior to Admission medications   Medication Sig Start Date End Date Taking? Authorizing Provider  amoxicillin -clavulanate (AUGMENTIN ) 875-125 MG tablet Take 1 tablet by mouth every 12 (twelve) hours. 02/21/24  Yes Jerrol Agent, MD  DULoxetine (CYMBALTA) 20 MG capsule Take 20 mg by mouth daily. 02/13/24  Yes [provider]  ondansetron  (ZOFRAN -ODT) 4 MG disintegrating tablet Take 1 tablet (4 mg total) by mouth every 8 (eight) hours as needed. 02/21/24  Yes Jerrol Agent, MD  BIOTIN 5000 PO Take 1 tablet by mouth in the morning and at bedtime.    [provider]  CALCIUM  PO Take 1,200 mg by mouth daily.    [provider]  Estradiol  1 MG/GM GEL Apply 1mg  twice a week as needed Patient taking differently: Apply 1 application  topically once a week. Apply twice weekly on Wednesday and Sundays 04/09/20   Koberlein, Junell C, MD  EVENING PRIMROSE OIL PO Take 500 mg by mouth daily.    [provider]  fluticasone (FLONASE) 50 MCG/ACT nasal spray Place 2 sprays into both nostrils daily.     [provider]  Glucosamine-Chondroitin (COSAMIN DS PO) Take 1 tablet by mouth daily.    [provider]  ipratropium (ATROVENT ) 0.06 % nasal spray Place 2 sprays into both nostrils 4 (four) times daily. Patient not taking: Reported on 08/10/2023 07/08/23   [name], PA  levothyroxine  (SYNTHROID ) 25 MCG tablet Take 1 tablet (25 mcg total) by mouth daily before breakfast. 08/20/20   Patsy Lenis, MD  Multiple Vitamin (MULTIVITAMIN ADULT PO) Take by mouth.    [provider]  pantoprazole  (PROTONIX ) 40 MG tablet TAKE 1-2 TABLETS BY MOUTH DAILY AS NEEDED 09/23/23   Avram Lupita BRAVO, MD  polyethylene glycol (MIRALAX  / GLYCOLAX ) 17 g packet Take 17 g by mouth 2 (two) times daily.    [provider]  Potassium 99 MG TABS Take 1 tablet by mouth daily.    [provider]  PREVIDENT 5000 BOOSTER PLUS 1.1 % PSTE Place onto teeth. 11/26/20   [provider]  traMADol  (ULTRAM ) 50 MG tablet Take 1 tablet (50 mg total) by mouth every 6 (six) hours as needed. 08/20/22   Ladora Congress, PA  Ubrogepant  (UBRELVY ) 50 MG TABS Take 50 mg by mouth once as needed for up to 1 dose. Patient taking differently: Take 50 mg by mouth as needed. 03/14/20   Koberlein, Junell C, MD    Allergies: Morphine , Sulfamethoxazole-trimethoprim, Sumatriptan, Carafate  [sucralfate ], and Sulfur dioxide    Review of Systems  Constitutional:  Positive for chills.  Gastrointestinal:  Positive for abdominal pain and nausea.  All other systems reviewed and are negative.   Updated Vital Signs BP 120/78   Pulse 77   Temp (!) 97.5 F (36.4 C) (Oral)   Resp 16   SpO2 100%   Physical Exam Vitals and nursing note reviewed.  Constitutional:      General: She is not in acute distress.    Appearance: She is well-developed.  HENT:     Head: Normocephalic and atraumatic.  Eyes:     Conjunctiva/sclera: Conjunctivae normal.  Cardiovascular:     Rate and Rhythm: Normal rate and regular  rhythm.     Heart sounds: No murmur heard. Pulmonary:     Effort: Pulmonary effort is normal. No respiratory distress.     Breath sounds: Normal breath sounds.  Abdominal:     Palpations: Abdomen is soft.     Tenderness: There is abdominal tenderness in the left lower quadrant. There is no guarding.  Musculoskeletal:        General: No swelling.     Cervical back: Neck supple.  Skin:    General: Skin is warm and dry.     Capillary Refill: Capillary refill takes less than 2 seconds.  Neurological:     Mental Status: She is alert.  Psychiatric:        Mood and Affect: Mood normal.     (all labs ordered are listed, but only abnormal results are displayed) Labs Reviewed  CBC - Abnormal; Notable for the following components:      Result Value   RBC 3.83 (*)    All other components within normal limits  URINALYSIS, ROUTINE W REFLEX MICROSCOPIC - Abnormal; Notable for the following components:   Color, Urine COLORLESS (*)    Hgb urine dipstick SMALL (*)    All other components within normal limits  LIPASE, BLOOD  COMPREHENSIVE METABOLIC PANEL WITH GFR    EKG: None  Radiology: CT ABDOMEN PELVIS W CONTRAST Result Date: 02/21/2024 CLINICAL DATA:  Abdominal pain EXAM: CT ABDOMEN AND PELVIS WITH CONTRAST TECHNIQUE: Multidetector CT imaging of the abdomen and pelvis was performed using the standard protocol following bolus administration of intravenous contrast. RADIATION DOSE REDUCTION: This exam was performed according to the departmental dose-optimization program which includes automated exposure control, adjustment of the mA and/or kV according to patient size and/or use of iterative reconstruction technique. CONTRAST:  OMNIPAQUE  IOHEXOL  300 MG/ML  SOLN COMPARISON:  CT abdomen pelvis January 19, 2024 FINDINGS: Lower chest: No acute abnormality. Hepatobiliary: Hepatic dome subcentimeter liver cyst. Cholecystectomy. Dilation of the common bile duct measuring up to 11 mm which can be  seen the setting of post cholecystectomy (reservoir phenomena). Duodenal diverticulum. Pancreas: Unremarkable. No pancreatic ductal dilatation or surrounding inflammatory changes. Spleen: Normal in size without focal abnormality. Adrenals/Urinary Tract: Adrenal glands are unremarkable. Subcentimeter simple cortical cyst in right kidney which does not require imaging follow-up. Cortical scarring in superolateral cortex of right kidney. Otherwise kidneys are normal, without renal calculi, focal lesion, or hydronephrosis. Bladder is unremarkable. Stomach/Bowel: Stomach is within normal limits. Sigmoid diverticulosis with pericolonic fat stranding in rectosigmoid region concerning for early diverticulitis (2/70). No bowel obstruction. Appendix is not well seen. Vascular/Lymphatic: Circumaortic left renal vein. No significant vascular findings are present. No enlarged abdominal or pelvic lymph nodes. Reproductive: Hysterectomy.  No adnexal mass. Other: No abdominal wall hernia or abnormality. No abdominopelvic ascites. Musculoskeletal: Coarse trabeculation of left iliac bone suggestive of Paget's disease. Degenerative changes of the  spine with associated scoliosis and degenerative disc disease at the lower lumbar spine. No acute osseous findings. IMPRESSION: Sigmoid diverticulosis with pericolonic fat stranding in rectosigmoid region concerning for early diverticulitis. Cholecystectomy with dilation of the common bile duct measuring up to 11 mm which can be seen the setting of post cholecystectomy (reservoir phenomena). Stigmata of monocytic Paget's disease. Circumaortic left renal vein, normal variation. Electronically Signed   By: Megan  Zare M.D.   On: 02/21/2024 11:52     Procedures   Medications Ordered in the ED  fentaNYL  (SUBLIMAZE ) injection 50 mcg (50 mcg Intravenous Not Given 02/21/24 1004)  ondansetron  (ZOFRAN ) injection 4 mg (4 mg Intravenous Given 02/21/24 1000)  sodium chloride  0.9 % bolus 1,000 mL (0  mLs Intravenous Stopped 02/21/24 1157)  iohexol  (OMNIPAQUE ) 300 MG/ML solution 100 mL (100 mLs Intravenous Contrast Given 02/21/24 1029)                                    Medical Decision Making Amount and/or Complexity of Data Reviewed Labs: ordered. Radiology: ordered.  Risk Prescription drug management.     78 year old female with medical history significant for diverticulitis presenting to the emergency department with left lower quadrant abdominal pain.  The patient has had sharp pain in her left lower quadrant with associated nausea and chills since this past Saturday.  She has had intermittent fluctuations of diarrhea and constipation over the past few weeks.  She states that her symptoms feel similar to the last time she had to be hospitalized for diverticulitis.  She endorses dysuria, denies frequency.  She denies fevers.  Her last bowel movement was this morning and was watery, no blood in her stool.  On arrival, the patient was afebrile, vitally stable, physical exam with left lower quadrant tenderness to palpation, no rebound or guarding, sinus rhythm noted on cardiac telemetry.  Concern for recurrent diverticulitis, considered bowel obstruction, UTI/pyelonephritis, nephrolithiasis.  IV access was obtained the patient was administered IV fluid bolus.  She declined IV pain medication in the emergency department.  Labs: CBC without a leukocytosis or anemia, CMP unremarkable, lipase normal, urinalysis without evidence of UTI.  CT abdomen pelvis:   IMPRESSION:  Sigmoid diverticulosis with pericolonic fat stranding in  rectosigmoid region concerning for early diverticulitis.    Cholecystectomy with dilation of the common bile duct measuring up  to 11 mm which can be seen the setting of post cholecystectomy  (reservoir phenomena).    Stigmata of monocytic Paget's disease.    Circumaortic left renal vein, normal variation.   Patient with uncomplicated diverticulitis.  Pain  well-controlled, tolerating oral intake.  Stable for discharge and outpatient management, return precautions provided.  Patient already has tramadol  for pain control at home, Zofran  prescribed for nausea.     Final diagnoses:  Diverticulitis    ED Discharge Orders          Ordered    amoxicillin -clavulanate (AUGMENTIN ) 875-125 MG tablet  Every 12 hours        02/21/24 1157    ondansetron  (ZOFRAN -ODT) 4 MG disintegrating tablet  Every 8 hours PRN        02/21/24 1157               Jerrol Agent, MD 02/21/24 1200

## 2024-02-21 NOTE — ED Triage Notes (Signed)
 Pt from UC, caox4 c/o LLQ abd pain, nausea, chills since Sat. Pt reports fluctuating between constipation and diarrhea x2 wks. Pt states pain feels exactly like previous diverticulitis.

## 2024-02-29 DIAGNOSIS — K5792 Diverticulitis of intestine, part unspecified, without perforation or abscess without bleeding: Secondary | ICD-10-CM | POA: Diagnosis not present

## 2024-02-29 DIAGNOSIS — B3731 Acute candidiasis of vulva and vagina: Secondary | ICD-10-CM | POA: Diagnosis not present

## 2024-03-03 ENCOUNTER — Encounter (HOSPITAL_BASED_OUTPATIENT_CLINIC_OR_DEPARTMENT_OTHER): Payer: Self-pay | Admitting: Emergency Medicine

## 2024-03-03 ENCOUNTER — Other Ambulatory Visit: Payer: Self-pay

## 2024-03-03 ENCOUNTER — Emergency Department (HOSPITAL_BASED_OUTPATIENT_CLINIC_OR_DEPARTMENT_OTHER)

## 2024-03-03 ENCOUNTER — Emergency Department (HOSPITAL_BASED_OUTPATIENT_CLINIC_OR_DEPARTMENT_OTHER)
Admission: EM | Admit: 2024-03-03 | Discharge: 2024-03-03 | Disposition: A | Discharge status: Home / Self Care | Attending: Emergency Medicine | Admitting: Emergency Medicine

## 2024-03-03 DIAGNOSIS — K5732 Diverticulitis of large intestine without perforation or abscess without bleeding: Secondary | ICD-10-CM | POA: Insufficient documentation

## 2024-03-03 DIAGNOSIS — K5792 Diverticulitis of intestine, part unspecified, without perforation or abscess without bleeding: Secondary | ICD-10-CM

## 2024-03-03 DIAGNOSIS — R109 Unspecified abdominal pain: Secondary | ICD-10-CM | POA: Diagnosis not present

## 2024-03-03 DIAGNOSIS — R1030 Lower abdominal pain, unspecified: Secondary | ICD-10-CM | POA: Diagnosis present

## 2024-03-03 DIAGNOSIS — K5750 Diverticulosis of both small and large intestine without perforation or abscess without bleeding: Secondary | ICD-10-CM | POA: Diagnosis not present

## 2024-03-03 DIAGNOSIS — Z9049 Acquired absence of other specified parts of digestive tract: Secondary | ICD-10-CM | POA: Diagnosis not present

## 2024-03-03 LAB — COMPREHENSIVE METABOLIC PANEL WITH GFR
ALT: 21 U/L (ref 0–44)
AST: 33 U/L (ref 15–41)
Albumin: 4.1 g/dL (ref 3.5–5.0)
Alkaline Phosphatase: 108 U/L (ref 38–126)
Anion gap: 11 (ref 5–15)
BUN: 11 mg/dL (ref 8–23)
CO2: 25 mmol/L (ref 22–32)
Calcium: 9.9 mg/dL (ref 8.9–10.3)
Chloride: 96 mmol/L — ABNORMAL LOW (ref 98–111)
Creatinine, Ser: 0.61 mg/dL (ref 0.44–1.00)
GFR, Estimated: >60 mL/min (ref >60)
Glucose, Bld: 106 mg/dL — ABNORMAL HIGH (ref 70–99)
Potassium: 4.1 mmol/L (ref 3.5–5.1)
Sodium: 132 mmol/L — ABNORMAL LOW (ref 135–145)
Total Bilirubin: 0.3 mg/dL (ref 0.0–1.2)
Total Protein: 6.7 g/dL (ref 6.5–8.1)

## 2024-03-03 LAB — CBC WITH DIFFERENTIAL/PLATELET
Abs Immature Granulocytes: 0.02 K/uL (ref 0.00–0.07)
Basophils Absolute: 0 K/uL (ref 0.0–0.1)
Basophils Relative: 0 %
Eosinophils Absolute: 0.1 K/uL (ref 0.0–0.5)
Eosinophils Relative: 1 %
HCT: 35.6 % — ABNORMAL LOW (ref 36.0–46.0)
Hemoglobin: 11.9 g/dL — ABNORMAL LOW (ref 12.0–15.0)
Immature Granulocytes: 0 %
Lymphocytes Relative: 19 %
Lymphs Abs: 1.5 K/uL (ref 0.7–4.0)
MCH: 32.6 pg (ref 26.0–34.0)
MCHC: 33.4 g/dL (ref 30.0–36.0)
MCV: 97.5 fL (ref 80.0–100.0)
Monocytes Absolute: 0.8 K/uL (ref 0.1–1.0)
Monocytes Relative: 10 %
Neutro Abs: 5.8 K/uL (ref 1.7–7.7)
Neutrophils Relative %: 70 %
Platelets: 221 K/uL (ref 150–400)
RBC: 3.65 MIL/uL — ABNORMAL LOW (ref 3.87–5.11)
RDW: 12.8 % (ref 11.5–15.5)
WBC: 8.3 K/uL (ref 4.0–10.5)
nRBC: 0 % (ref 0.0–0.2)

## 2024-03-03 MED ORDER — AMOXICILLIN-POT CLAVULANATE 875-125 MG PO TABS: 1.0000 | ORAL_TABLET | Freq: Two times a day (BID) | ORAL | 0 refills | Status: DC

## 2024-03-03 MED ORDER — IOHEXOL 300 MG/ML  SOLN
80.0000 mL | Freq: Once | INTRAMUSCULAR | Status: AC | PRN
  Administered 2024-03-03: 80 mL via INTRAVENOUS

## 2024-03-03 NOTE — ED Notes (Signed)
 Patient transported to CT

## 2024-03-03 NOTE — ED Notes (Signed)
 Pt discharged home and given discharge paperwork. Opportunities given for questions. Pt verbalizes understanding. PIV removed x1. Bethena Powell SAUNDERS , RN

## 2024-03-03 NOTE — ED Triage Notes (Signed)
 Pt c/o abdominal pain, little BMs, burning in rectum area and nausea since Thursday. Tramadol  taken this am. Hx Diverticulitis-recently seen here

## 2024-03-03 NOTE — Discharge Instructions (Signed)
 You were seen here today for your diverticulitis.  You do still have diverticulitis on your CT scan but no evidence of complications.  It appears to be mild.  Please continue the Augmentin  as we discussed.  You have been given another 14 days.  Please recheck with your doctor in the next 1 to 2 days.  You should return here if you are having worsening symptoms from this at any time. Your sodium is slightly low today at 132.  It has been low in the 30s but has dropped some more.  Please continue to drink your Pedialyte and have this rechecked by your doctor.  It is not something we would put you in the hospital for at this level but does need to be followed up on. Your hemoglobin was slightly low at 11.9 which is just below the threshold of 12.  This may be within lab error but again, should be followed up with your primary care doctor.

## 2024-03-03 NOTE — ED Provider Notes (Signed)
 [place] EMERGENCY DEPARTMENT AT [facility] Provider Note   CSN: [id number] Arrival date & time: 03/03/24  1102     Patient presents with: Abdominal Pain and Constipation   Alexa Rios is a 78 y.o. female.   HPI   78 yo female with recent diagnosis of diverticulitis treated here8/5 with augmentin , and her primary doctor continued augmentin  on Thursday after she completed does on Monday.  She is eating soft foods but feels increased pain Thursday, and now worse than then.  Pain in lower abdomen and feels like she is constipated. No fever some chills, history of abscess frin diverticulitis 2021.   Prior to Admission medications   Medication Sig Start Date End Date Taking? Authorizing Provider  amoxicillin -clavulanate (AUGMENTIN ) 875-125 MG tablet Take 1 tablet by mouth every 12 (twelve) hours. 03/03/24  Yes Levander Houston, MD  BIOTIN 5000 PO Take 1 tablet by mouth in the morning and at bedtime.    [provider]  CALCIUM  PO Take 1,200 mg by mouth daily.    [provider]  DULoxetine (CYMBALTA) 20 MG capsule Take 20 mg by mouth daily. 02/13/24   [provider]  Estradiol  1 MG/GM GEL Apply 1mg  twice a week as needed Patient taking differently: Apply 1 application  topically once a week. Apply twice weekly on Wednesday and Sundays 04/09/20   Koberlein, Junell C, MD  EVENING PRIMROSE OIL PO Take 500 mg by mouth daily.    [provider]  fluticasone (FLONASE) 50 MCG/ACT nasal spray Place 2 sprays into both nostrils daily.    [provider]  Glucosamine-Chondroitin (COSAMIN DS PO) Take 1 tablet by mouth daily.    [provider]  ipratropium (ATROVENT ) 0.06 % nasal spray Place 2 sprays into both nostrils 4 (four) times daily. Patient not taking: Reported on 08/10/2023 07/08/23   [name], PA  levothyroxine  (SYNTHROID ) 25 MCG tablet Take 1 tablet (25 mcg total) by mouth daily before breakfast. 08/20/20    Patsy Lenis, MD  Multiple Vitamin (MULTIVITAMIN ADULT PO) Take by mouth.    [provider]  ondansetron  (ZOFRAN -ODT) 4 MG disintegrating tablet Take 1 tablet (4 mg total) by mouth every 8 (eight) hours as needed. 02/21/24   Jerrol Agent, MD  pantoprazole  (PROTONIX ) 40 MG tablet TAKE 1-2 TABLETS BY MOUTH DAILY AS NEEDED 09/23/23   Avram Lupita BRAVO, MD  polyethylene glycol (MIRALAX  / GLYCOLAX ) 17 g packet Take 17 g by mouth 2 (two) times daily.    [provider]  Potassium 99 MG TABS Take 1 tablet by mouth daily.    [provider]  PREVIDENT 5000 BOOSTER PLUS 1.1 % PSTE Place onto teeth. 11/26/20   [provider]  traMADol  (ULTRAM ) 50 MG tablet Take 1 tablet (50 mg total) by mouth every 6 (six) hours as needed. 08/20/22   Ladora Congress, PA  Ubrogepant  (UBRELVY ) 50 MG TABS Take 50 mg by mouth once as needed for up to 1 dose. Patient taking differently: Take 50 mg by mouth as needed. 03/14/20   Koberlein, Junell C, MD    Allergies: Morphine , Sulfamethoxazole-trimethoprim, Sumatriptan, Carafate  [sucralfate ], and Sulfur dioxide    Review of Systems  Updated Vital Signs BP 116/76 (BP Location: Left Arm)   Pulse 73   Temp 98.1 F (36.7 C) (Oral)   Resp 18   SpO2 99%   Physical Exam Vitals and nursing note reviewed.  Constitutional:      Appearance: She is well-developed.  HENT:  Head: Normocephalic and atraumatic.     Right Ear: External ear normal.     Left Ear: External ear normal.     Nose: Nose normal.  Eyes:     Conjunctiva/sclera: Conjunctivae normal.     Pupils: Pupils are equal, round, and reactive to light.  Cardiovascular:     Rate and Rhythm: Normal rate and regular rhythm.     Heart sounds: Normal heart sounds.  Pulmonary:     Effort: Pulmonary effort is normal.     Breath sounds: Normal breath sounds.  Abdominal:     General: Abdomen is flat. Bowel sounds are normal.     Palpations: Abdomen is soft.     Tenderness: There is abdominal  tenderness in the suprapubic area and left lower quadrant.  Genitourinary:    Rectum: Tenderness present. No mass.  Musculoskeletal:        General: Normal range of motion.     Cervical back: Normal range of motion and neck supple.  Skin:    General: Skin is warm and dry.  Neurological:     General: No focal deficit present.     Mental Status: She is alert and oriented to person, place, and time. Mental status is at baseline.     Sensory: No sensory deficit.     Motor: No weakness.     Gait: Gait normal.     Deep Tendon Reflexes: Reflexes are normal and symmetric. Reflexes normal.  Psychiatric:        Behavior: Behavior normal.        Thought Content: Thought content normal.        Judgment: Judgment normal.     (all labs ordered are listed, but only abnormal results are displayed) Labs Reviewed  CBC WITH DIFFERENTIAL/PLATELET - Abnormal; Notable for the following components:      Result Value   RBC 3.65 (*)    Hemoglobin 11.9 (*)    HCT 35.6 (*)    All other components within normal limits  COMPREHENSIVE METABOLIC PANEL WITH GFR - Abnormal; Notable for the following components:   Sodium 132 (*)    Chloride 96 (*)    Glucose, Bld 106 (*)    All other components within normal limits    EKG: None  Radiology: CT ABDOMEN PELVIS W CONTRAST Result Date: 03/03/2024 CLINICAL DATA:  Diverticulitis, complication suspected Pt c/o abdominal pain, little BMs, burning in rectum area and nausea since Thursday. Tramadol  taken this am. Hx Diverticulitis-recently seen here-per triage note, previous A/P done on 02/21/24 EXAM: CT ABDOMEN AND PELVIS WITH CONTRAST TECHNIQUE: Multidetector CT imaging of the abdomen and pelvis was performed using the standard protocol following bolus administration of intravenous contrast. RADIATION DOSE REDUCTION: This exam was performed according to the departmental dose-optimization program which includes automated exposure control, adjustment of the mA and/or kV  according to patient size and/or use of iterative reconstruction technique. CONTRAST:  80mL OMNIPAQUE  IOHEXOL  300 MG/ML  SOLN COMPARISON:  CT abdomen pelvis 02/21/2024 FINDINGS: Lower chest: No acute abnormality. Hepatobiliary: No focal liver abnormality. Status post cholecystectomy. No biliary dilatation. Pancreas: No focal lesion. Normal pancreatic contour. No surrounding inflammatory changes. No main pancreatic ductal dilatation. Spleen: Normal in size without focal abnormality. Adrenals/Urinary Tract: No adrenal nodule bilaterally. Bilateral kidneys enhance symmetrically. Right renal cortical scarring. Subcentimeter hypodensities too small to characterize. No hydronephrosis. No hydroureter.  No nephroureterolithiasis. The urinary bladder is unremarkable. On delayed imaging, there is no urothelial wall thickening and there are no filling defects  in the opacified portions of the bilateral collecting systems or ureters. Stomach/Bowel: Stomach is within normal limits. No evidence of bowel wall thickening or dilatation. Second portion of the duodenum diverticula. Under distended rectosigmoid colon with persistent mild distal sigmoid bowel wall thickening and trace pericolonic fat stranding (3:67). Colonic diverticulosis. Appendix appears normal. Vascular/Lymphatic: Circumaortic left renal vein. No abdominal aorta or iliac aneurysm. Mild atherosclerotic plaque of the aorta and its branches. No abdominal, pelvic, or inguinal lymphadenopathy. Reproductive: Status post hysterectomy. No adnexal masses. Other: No intraperitoneal free fluid. No intraperitoneal free gas. No organized fluid collection. Musculoskeletal: No abdominal wall hernia or abnormality. No suspicious lytic or blastic osseous lesions. No acute displaced fracture. Coarsened left iliac bone medullary and mildly thickened cortex. Grade 1 anterolisthesis of L4 on L5. IMPRESSION: 1. Colonic diverticulosis with persistent acute uncomplicated mild/early distal  sigmoid diverticulitis. Recommend colonoscopy status post treatment and status post complete resolution of inflammatory changes to exclude an underlying lesion. 2. 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 3. Status post cholecystectomy. Electronically Signed   By: Morgane  Naveau M.D.   On: 03/03/2024 13:53     Procedures   Medications Ordered in the ED  iohexol  (OMNIPAQUE ) 300 MG/ML solution 80 mL (80 mLs Intravenous Contrast Given 03/03/24 1320)    Clinical Course as of 03/03/24 1447  Sat Mar 03, 2024  1441 Complete metabolic panel reviewed interpreted significant for sodium slightly low at 132. [DR]  1441 Sodium was 137 several days ago. [DR]  1442  CBC reviewed interpreted CBC is reviewed and interpreted significant for mild decrease in hemoglobin at 11.9 from prior of 12.5 [DR]    Clinical Course User Index [DR] Levander Houston, MD                                 Medical Decision Making Amount and/or Complexity of Data Reviewed Labs: ordered. Radiology: ordered.  Risk Prescription drug management.   78 year old female recent gnosis of diverticulitis on Augmentin  presents today with ongoing pain in the suprapubic to left lower quadrant area and rectum.  She has had previous abscess and was concerned about abscess at this time.  Patient was evaluated here in the ED with labs.  White blood cell count is normal. Patient was evaluated with CT scan that shows no evidence of abscess formation but early diverticulitis. Plan to continue patient's Augmentin  and have close follow-up Patient with mild hyponatremia patient advised to have this rechecked at her doctor's office Patient with mild anemia may be within lab variation.  Again patient advised to have rechecked at primary care. Discussed return precautions and need for close follow-up and patient voices understanding     Final diagnoses:  Diverticulitis    ED Discharge Orders          Ordered    amoxicillin -clavulanate  (AUGMENTIN ) 875-125 MG tablet  Every 12 hours        03/03/24 1445               Levander Houston, MD 03/03/24 1447

## 2024-03-07 DIAGNOSIS — Z1389 Encounter for screening for other disorder: Secondary | ICD-10-CM | POA: Diagnosis not present

## 2024-03-07 DIAGNOSIS — K5792 Diverticulitis of intestine, part unspecified, without perforation or abscess without bleeding: Secondary | ICD-10-CM | POA: Diagnosis not present

## 2024-03-07 DIAGNOSIS — R7989 Other specified abnormal findings of blood chemistry: Secondary | ICD-10-CM | POA: Diagnosis not present

## 2024-03-07 DIAGNOSIS — R109 Unspecified abdominal pain: Secondary | ICD-10-CM | POA: Diagnosis not present

## 2024-03-07 DIAGNOSIS — E871 Hypo-osmolality and hyponatremia: Secondary | ICD-10-CM | POA: Diagnosis not present

## 2024-03-07 DIAGNOSIS — D509 Iron deficiency anemia, unspecified: Secondary | ICD-10-CM | POA: Diagnosis not present

## 2024-03-12 ENCOUNTER — Ambulatory Visit: Payer: Self-pay | Admitting: *Deleted

## 2024-03-12 NOTE — Telephone Encounter (Signed)
 Recommended patient be seen within 4 hours due to sx. Patient reports she will go to [facility] ED.      FYI Only or Action Required?: Action required by provider: provider at [facility].  Patient was last seen in primary care on na .  Called Nurse Triage reporting Abdominal Pain.  Symptoms began several days ago.  Interventions attempted: Rest, hydration, or home remedies.  Symptoms are: gradually worsening.  Triage Disposition: See HCP Within 4 Hours (Or PCP Triage)  Patient/caregiver understands and will follow disposition?: Yes            Copied from CRM #[id number]. Topic: Clinical - Red Word Triage >> Mar 12, 2024 11:58 AM Laurier BROCKS wrote: Red Word that prompted transfer to Nurse Triage: Patient states she is having some abdominal pain that has caused difficultly using the restroom. Patient believes it may be from Diverticulitis Reason for Disposition  [1] MILD-MODERATE pain AND [2] constant AND [3] present > 2 hours  Answer Assessment - Initial Assessment Questions Recommended to be seen with in 4 hours. Unable to schedule with patient practice. Patient reports she is to go back to her PCP tomorrow for lab work. Recommended patient go to ED for evaluation due to hx diverticulitis and worsening pain   1. LOCATION: Where does it hurt?      Left lower abdominal area  2. RADIATION: Does the pain shoot anywhere else? (e.g., chest, back)     Lower abdomen and across right side  3. ONSET: When did the pain begin? (e.g., minutes, hours or days ago)      Saturday  4. SUDDEN: Gradual or sudden onset?     Gradual over 5-6 days  5. PATTERN Does the pain come and go, or is it constant?     Hurts more than it has before , cutting pain with trying to defecate 6. SEVERITY: How bad is the pain?  (e.g., Scale 1-10; mild, moderate, or severe)     Moderate 5/10 7. RECURRENT SYMPTOM: Have you ever had this type of stomach pain before? If Yes,  ask: When was the last time? and What happened that time?      Yes  8. CAUSE: What do you think is causing the stomach pain? (e.g., gallstones, recent abdominal surgery)     Flare up diverticulitis 9. RELIEVING/AGGRAVATING FACTORS: What makes it better or worse? (e.g., antacids, bending or twisting motion, bowel movement)     Na  10. OTHER SYMPTOMS: Do you have any other symptoms? (e.g., back pain, diarrhea, fever, urination pain, vomiting)       Pains feel like previous sx of diverticulitis left low abdominal pain constant, radiates to right low abdomen. Pain having stool. Diarrhea noted. Forceful gas.  11. PREGNANCY: Is there any chance you are pregnant? When was your last menstrual period?       na  Protocols used: Abdominal Pain - Female-A-AH

## 2024-03-13 ENCOUNTER — Emergency Department (HOSPITAL_BASED_OUTPATIENT_CLINIC_OR_DEPARTMENT_OTHER)

## 2024-03-13 ENCOUNTER — Other Ambulatory Visit: Payer: Self-pay

## 2024-03-13 ENCOUNTER — Emergency Department (HOSPITAL_BASED_OUTPATIENT_CLINIC_OR_DEPARTMENT_OTHER)
Admission: EM | Admit: 2024-03-13 | Discharge: 2024-03-13 | Disposition: A | Discharge status: Home / Self Care | Attending: Emergency Medicine | Admitting: Emergency Medicine

## 2024-03-13 DIAGNOSIS — E871 Hypo-osmolality and hyponatremia: Secondary | ICD-10-CM | POA: Diagnosis not present

## 2024-03-13 DIAGNOSIS — R109 Unspecified abdominal pain: Secondary | ICD-10-CM | POA: Diagnosis not present

## 2024-03-13 DIAGNOSIS — K5732 Diverticulitis of large intestine without perforation or abscess without bleeding: Secondary | ICD-10-CM | POA: Diagnosis not present

## 2024-03-13 DIAGNOSIS — K7689 Other specified diseases of liver: Secondary | ICD-10-CM | POA: Diagnosis not present

## 2024-03-13 DIAGNOSIS — K5792 Diverticulitis of intestine, part unspecified, without perforation or abscess without bleeding: Secondary | ICD-10-CM | POA: Diagnosis not present

## 2024-03-13 DIAGNOSIS — K5730 Diverticulosis of large intestine without perforation or abscess without bleeding: Secondary | ICD-10-CM | POA: Diagnosis not present

## 2024-03-13 DIAGNOSIS — N281 Cyst of kidney, acquired: Secondary | ICD-10-CM | POA: Diagnosis not present

## 2024-03-13 DIAGNOSIS — R1032 Left lower quadrant pain: Secondary | ICD-10-CM | POA: Diagnosis present

## 2024-03-13 LAB — CBC WITH DIFFERENTIAL/PLATELET
Abs Immature Granulocytes: 0.03 K/uL (ref 0.00–0.07)
Basophils Absolute: 0 K/uL (ref 0.0–0.1)
Basophils Relative: 1 %
Eosinophils Absolute: 0 K/uL (ref 0.0–0.5)
Eosinophils Relative: 1 %
HCT: 36.9 % (ref 36.0–46.0)
Hemoglobin: 12.3 g/dL (ref 12.0–15.0)
Immature Granulocytes: 0 %
Lymphocytes Relative: 15 %
Lymphs Abs: 1.3 K/uL (ref 0.7–4.0)
MCH: 32.6 pg (ref 26.0–34.0)
MCHC: 33.3 g/dL (ref 30.0–36.0)
MCV: 97.9 fL (ref 80.0–100.0)
Monocytes Absolute: 0.8 K/uL (ref 0.1–1.0)
Monocytes Relative: 9 %
Neutro Abs: 6.4 K/uL (ref 1.7–7.7)
Neutrophils Relative %: 74 %
Platelets: 207 K/uL (ref 150–400)
RBC: 3.77 MIL/uL — ABNORMAL LOW (ref 3.87–5.11)
RDW: 12.8 % (ref 11.5–15.5)
WBC: 8.6 K/uL (ref 4.0–10.5)
nRBC: 0 % (ref 0.0–0.2)

## 2024-03-13 LAB — URINALYSIS, W/ REFLEX TO CULTURE (INFECTION SUSPECTED)
Bacteria, UA: NONE SEEN
Bilirubin Urine: NEGATIVE
Glucose, UA: NEGATIVE mg/dL
Ketones, ur: NEGATIVE mg/dL
Leukocytes,Ua: NEGATIVE
Nitrite: NEGATIVE
Protein, ur: NEGATIVE mg/dL
Specific Gravity, Urine: 1.005 (ref 1.005–1.030)
pH: 6.5 (ref 5.0–8.0)

## 2024-03-13 LAB — COMPREHENSIVE METABOLIC PANEL WITH GFR
ALT: 21 U/L (ref 0–44)
AST: 27 U/L (ref 15–41)
Albumin: 4.3 g/dL (ref 3.5–5.0)
Alkaline Phosphatase: 121 U/L (ref 38–126)
Anion gap: 13 (ref 5–15)
BUN: 13 mg/dL (ref 8–23)
CO2: 24 mmol/L (ref 22–32)
Calcium: 9.8 mg/dL (ref 8.9–10.3)
Chloride: 97 mmol/L — ABNORMAL LOW (ref 98–111)
Creatinine, Ser: 0.67 mg/dL (ref 0.44–1.00)
GFR, Estimated: >60 mL/min (ref >60)
Glucose, Bld: 99 mg/dL (ref 70–99)
Potassium: 3.8 mmol/L (ref 3.5–5.1)
Sodium: 134 mmol/L — ABNORMAL LOW (ref 135–145)
Total Bilirubin: 0.3 mg/dL (ref 0.0–1.2)
Total Protein: 7 g/dL (ref 6.5–8.1)

## 2024-03-13 LAB — LIPASE, BLOOD: Lipase: 25 U/L (ref 11–51)

## 2024-03-13 LAB — LACTIC ACID, PLASMA: Lactic Acid, Venous: 0.9 mmol/L (ref 0.5–1.9)

## 2024-03-13 MED ORDER — CIPROFLOXACIN HCL 500 MG PO TABS
500.0000 mg | ORAL_TABLET | Freq: Two times a day (BID) | ORAL | 0 refills | Status: AC
Start: 2024-03-13 — End: ?

## 2024-03-13 MED ORDER — IOHEXOL 300 MG/ML  SOLN
100.0000 mL | Freq: Once | INTRAMUSCULAR | Status: AC | PRN
  Administered 2024-03-13: 85 mL via INTRAVENOUS

## 2024-03-13 MED ORDER — ONDANSETRON HCL 4 MG/2ML IJ SOLN
4.0000 mg | Freq: Once | INTRAMUSCULAR | Status: AC
  Administered 2024-03-13: 4 mg via INTRAVENOUS
  Filled 2024-03-13: qty 2

## 2024-03-13 MED ORDER — SODIUM CHLORIDE 0.9 % IV BOLUS
1000.0000 mL | Freq: Once | INTRAVENOUS | Status: AC
  Administered 2024-03-13: 1000 mL via INTRAVENOUS

## 2024-03-13 MED ORDER — PIPERACILLIN-TAZOBACTAM 3.375 G IVPB 30 MIN
3.3750 g | Freq: Once | INTRAVENOUS | Status: AC
  Administered 2024-03-13: 3.375 g via INTRAVENOUS
  Filled 2024-03-13: qty 50

## 2024-03-13 MED ORDER — METRONIDAZOLE 500 MG PO TABS: 500.0000 mg | ORAL_TABLET | Freq: Two times a day (BID) | ORAL | 0 refills | Status: AC

## 2024-03-13 NOTE — ED Provider Notes (Signed)
 [place] EMERGENCY DEPARTMENT AT [facility] Provider Note   CSN: [id number] Arrival date & time: 03/13/24  1053     Patient presents with: Abdominal Pain   Alexa Rios is a 78 y.o. female.  With a history of status post cholecystectomy, diverticulosis and complicated diverticulitis who presents to the ED for abdominal pain.  Patient was seen in her PCP office in early August and directed to the ED for further evaluation.  She was treated for diverticulitis as an outpatient with Augmentin .  She was seen again in this emergency department on August 16 for abdominal pain found to have uncomplicated early diverticulitis at that time.  She was discharged with instructions to continue taking Augmentin  as outpatient.  She has done so since that time however over the last couple days has had severe left lower quadrant pain along with nausea and vomiting.  Couple episodes of nonbloody diarrhea.  No rectal pain.  Does note some dysuria as well.  No fevers chills chest pain or shortness of breath.    Abdominal Pain      Prior to Admission medications   Medication Sig Start Date End Date Taking? Authorizing Provider  ciprofloxacin  (CIPRO ) 500 MG tablet Take 1 tablet (500 mg total) by mouth 2 (two) times daily. 03/13/24  Yes Pamella Ozell DELENA, DO  metroNIDAZOLE  (FLAGYL ) 500 MG tablet Take 1 tablet (500 mg total) by mouth 2 (two) times daily for 5 days. 03/13/24 03/18/24 Yes Pamella Ozell A, DO  BIOTIN 5000 PO Take 1 tablet by mouth in the morning and at bedtime.    [provider]  CALCIUM  PO Take 1,200 mg by mouth daily.    [provider]  DULoxetine (CYMBALTA) 20 MG capsule Take 20 mg by mouth daily. 02/13/24   [provider]  Estradiol  1 MG/GM GEL Apply 1mg  twice a week as needed Patient taking differently: Apply 1 application  topically once a week. Apply twice weekly on Wednesday and Sundays 04/09/20   Koberlein, Junell C, MD  EVENING PRIMROSE OIL  PO Take 500 mg by mouth daily.    [provider]  fluticasone (FLONASE) 50 MCG/ACT nasal spray Place 2 sprays into both nostrils daily.    [provider]  Glucosamine-Chondroitin (COSAMIN DS PO) Take 1 tablet by mouth daily.    [provider]  ipratropium (ATROVENT ) 0.06 % nasal spray Place 2 sprays into both nostrils 4 (four) times daily. Patient not taking: Reported on 08/10/2023 07/08/23   [name], PA  levothyroxine  (SYNTHROID ) 25 MCG tablet Take 1 tablet (25 mcg total) by mouth daily before breakfast. 08/20/20   Patsy Lenis, MD  Multiple Vitamin (MULTIVITAMIN ADULT PO) Take by mouth.    [provider]  ondansetron  (ZOFRAN -ODT) 4 MG disintegrating tablet Take 1 tablet (4 mg total) by mouth every 8 (eight) hours as needed. 02/21/24   Jerrol Agent, MD  pantoprazole  (PROTONIX ) 40 MG tablet TAKE 1-2 TABLETS BY MOUTH DAILY AS NEEDED 09/23/23   Avram Lupita BRAVO, MD  polyethylene glycol (MIRALAX  / GLYCOLAX ) 17 g packet Take 17 g by mouth 2 (two) times daily.    [provider]  Potassium 99 MG TABS Take 1 tablet by mouth daily.    [provider]  PREVIDENT 5000 BOOSTER PLUS 1.1 % PSTE Place onto teeth. 11/26/20   [provider]  traMADol  (ULTRAM ) 50 MG tablet Take 1 tablet (50 mg total) by mouth every 6 (six) hours as needed. 08/20/22  Ladora Congress, PA  Ubrogepant  (UBRELVY ) 50 MG TABS Take 50 mg by mouth once as needed for up to 1 dose. Patient taking differently: Take 50 mg by mouth as needed. 03/14/20   Koberlein, Junell C, MD    Allergies: Morphine , Sulfamethoxazole-trimethoprim, Sumatriptan, Carafate  [sucralfate ], and Sulfur dioxide    Review of Systems  Gastrointestinal:  Positive for abdominal pain.    Updated Vital Signs BP 117/72   Pulse 84   Temp 98.4 F (36.9 C) (Oral)   Resp 18   SpO2 98%   Physical Exam Vitals and nursing note reviewed.  HENT:     Head: Normocephalic and atraumatic.  Eyes:     Pupils:  Pupils are equal, round, and reactive to light.  Cardiovascular:     Rate and Rhythm: Normal rate and regular rhythm.  Pulmonary:     Effort: Pulmonary effort is normal.     Breath sounds: Normal breath sounds.  Abdominal:     Palpations: Abdomen is soft.     Tenderness: There is abdominal tenderness in the left lower quadrant. There is no guarding or rebound.  Skin:    General: Skin is warm and dry.  Neurological:     Mental Status: She is alert.  Psychiatric:        Mood and Affect: Mood normal.     (all labs ordered are listed, but only abnormal results are displayed) Labs Reviewed  COMPREHENSIVE METABOLIC PANEL WITH GFR - Abnormal; Notable for the following components:      Result Value   Sodium 134 (*)    Chloride 97 (*)    All other components within normal limits  CBC WITH DIFFERENTIAL/PLATELET - Abnormal; Notable for the following components:   RBC 3.77 (*)    All other components within normal limits  URINALYSIS, W/ REFLEX TO CULTURE (INFECTION SUSPECTED) - Abnormal; Notable for the following components:   Color, Urine COLORLESS (*)    Hgb urine dipstick SMALL (*)    All other components within normal limits  CULTURE, BLOOD (ROUTINE X 2)  CULTURE, BLOOD (ROUTINE X 2)  LIPASE, BLOOD  LACTIC ACID, PLASMA  LACTIC ACID, PLASMA    EKG: None  Radiology: CT ABDOMEN PELVIS W CONTRAST Result Date: 03/13/2024 CLINICAL DATA:  History of diverticulitis. Worsening abdominal pain. EXAM: CT ABDOMEN AND PELVIS WITH CONTRAST TECHNIQUE: Multidetector CT imaging of the abdomen and pelvis was performed using the standard protocol following bolus administration of intravenous contrast. RADIATION DOSE REDUCTION: This exam was performed according to the departmental dose-optimization program which includes automated exposure control, adjustment of the mA and/or kV according to patient size and/or use of iterative reconstruction technique. CONTRAST:  85mL OMNIPAQUE  IOHEXOL  300 MG/ML   SOLN COMPARISON:  March 03, 2024. FINDINGS: Lower chest: No acute abnormality. Hepatobiliary: Stable small right hepatic cyst. Status post cholecystectomy. No biliary dilatation. Pancreas: Unremarkable. No pancreatic ductal dilatation or surrounding inflammatory changes. Spleen: Normal in size without focal abnormality. Adrenals/Urinary Tract: Adrenal glands appear normal. Stable scarring seen involving upper pole of right kidney. Small right renal cyst is noted. No hydronephrosis or renal obstruction is noted. Urinary bladder is unremarkable. Stomach/Bowel: Stomach is unremarkable. There is no evidence of bowel obstruction. The appendix is not visualized. Mild sigmoid diverticulosis is again noted. Minimal to mild inflammatory stranding is noted suggesting probable mild diverticulitis. Vascular/Lymphatic: Aortic atherosclerosis. No enlarged abdominal or pelvic lymph nodes. Reproductive: Status post hysterectomy. No adnexal masses. Other: No ascites or hernia is noted. Musculoskeletal: Stable findings consistent with  Paget's disease involving the left pelvis. No acute abnormality seen. IMPRESSION: 1. Mild sigmoid diverticulosis is again noted with minimal to mild inflammatory stranding suggesting probable mild diverticulitis. 2. Stable findings consistent with Paget's disease involving the left pelvis. 3. Aortic atherosclerosis. Aortic Atherosclerosis (ICD10-I70.0). Electronically Signed   By: Lynwood Landy Raddle M.D.   On: 03/13/2024 13:13     Procedures   Medications Ordered in the ED  ondansetron  (ZOFRAN ) injection 4 mg (4 mg Intravenous Given 03/13/24 1148)  sodium chloride  0.9 % bolus 1,000 mL (0 mLs Intravenous Stopped 03/13/24 1232)  iohexol  (OMNIPAQUE ) 300 MG/ML solution 100 mL (85 mLs Intravenous Contrast Given 03/13/24 1233)  piperacillin -tazobactam (ZOSYN ) IVPB 3.375 g (3.375 g Intravenous New Bag/Given 03/13/24 1242)    Clinical Course as of 03/13/24 1451  Tue Mar 13, 2024  1443 No leukocytosis.   No elevation in venous lactic.  Patient has remained afebrile and normotensive.  CT abdomen pelvis shows diverticulosis with again mild diverticulitis.  Uncomplicated without abscess or fistula formation.  Patient feels a little bit better after Zosyn  IV fluids and Zofran  here.  No recurrent abdominal pain or diarrhea.  We discussed option for admission for inpatient management versus outpatient management of diverticulitis.  Considering there is no elevation in her white count or significant change in CT patient feels comfortable with going home.  We will change her antibiotic to Cipro  Flagyl  as Augmentin  does not seem to be working and Cipro  Flagyl  has worked for similar episodes of mild diverticulitis in the past.  She understands return precautions and when to come back for that would be worrisome for complicated diverticulitis. [MP]    Clinical Course User Index [MP] Pamella Ozell LABOR, DO                                 Medical Decision Making 78 year old female returns to the ED for suspected complications of diverticulitis.  Has been seen multiple times earlier this month for cute diverticulitis and discharged with antibiotics.  CT showed no evidence of abscess or other complication at that time.  Worsening of her symptoms over the last couple of days despite p.o. Augmentin  at home.  Afebrile normotensive here with repeat left lower quadrant tenderness.  Will repeat laboratory workup and CT abdomen pelvis with IV contrast to see how diverticulitis has progressed and see if there are any complicating features such as abscess or fistula formation that require emergent surgical intervention.  Will provide with IV fluids, IV Zofran  and IV antibiotics here in the ED awaiting the results of her workup  Amount and/or Complexity of Data Reviewed Labs: ordered. Radiology: ordered.  Risk Prescription drug management.        Final diagnoses:  Diverticulitis    ED Discharge Orders           Ordered    ciprofloxacin  (CIPRO ) 500 MG tablet  2 times daily        03/13/24 1451    metroNIDAZOLE  (FLAGYL ) 500 MG tablet  2 times daily        03/13/24 1451               Pamella Ozell LABOR, DO 03/13/24 1451

## 2024-03-13 NOTE — ED Triage Notes (Signed)
 Patient states diverticulitis for 3 weeks. Pain worsened on Saturday and has been increasing since. Almost finished with antibiotics. +nausea.

## 2024-03-13 NOTE — Discharge Instructions (Signed)
 You were seen in the emerged apartment again for diverticulitis You do not have a white count, fever or complicated diverticulitis on the CT scan We discussed coming to the hospital versus going home with different antibiotics Lets change the antibiotics to Cipro  Flagyl  Stop taking Augmentin  Continue taking a probiotic Follow-up with your primary care doctor within 1 week for reevaluation Return to the emergency room for worsening abdominal pain fevers or other concerns

## 2024-03-18 LAB — CULTURE, BLOOD (ROUTINE X 2)
Culture: NO GROWTH
Culture: NO GROWTH
Special Requests: ADEQUATE
Special Requests: ADEQUATE

## 2024-04-03 DIAGNOSIS — D500 Iron deficiency anemia secondary to blood loss (chronic): Secondary | ICD-10-CM | POA: Diagnosis not present

## 2024-04-03 DIAGNOSIS — R1010 Upper abdominal pain, unspecified: Secondary | ICD-10-CM | POA: Diagnosis not present

## 2024-04-03 DIAGNOSIS — K2100 Gastro-esophageal reflux disease with esophagitis, without bleeding: Secondary | ICD-10-CM | POA: Diagnosis not present

## 2024-04-11 DIAGNOSIS — E039 Hypothyroidism, unspecified: Secondary | ICD-10-CM | POA: Diagnosis not present

## 2024-04-11 DIAGNOSIS — M8580 Other specified disorders of bone density and structure, unspecified site: Secondary | ICD-10-CM | POA: Diagnosis not present

## 2024-04-11 DIAGNOSIS — E538 Deficiency of other specified B group vitamins: Secondary | ICD-10-CM | POA: Diagnosis not present

## 2024-04-11 DIAGNOSIS — R7989 Other specified abnormal findings of blood chemistry: Secondary | ICD-10-CM | POA: Diagnosis not present

## 2024-04-11 DIAGNOSIS — Z1389 Encounter for screening for other disorder: Secondary | ICD-10-CM | POA: Diagnosis not present

## 2024-04-11 DIAGNOSIS — D509 Iron deficiency anemia, unspecified: Secondary | ICD-10-CM | POA: Diagnosis not present

## 2024-04-12 DIAGNOSIS — K224 Dyskinesia of esophagus: Secondary | ICD-10-CM | POA: Diagnosis not present

## 2024-04-12 DIAGNOSIS — K5710 Diverticulosis of small intestine without perforation or abscess without bleeding: Secondary | ICD-10-CM | POA: Diagnosis not present

## 2024-04-12 DIAGNOSIS — K5730 Diverticulosis of large intestine without perforation or abscess without bleeding: Secondary | ICD-10-CM | POA: Diagnosis not present

## 2024-04-18 DIAGNOSIS — R300 Dysuria: Secondary | ICD-10-CM | POA: Diagnosis not present

## 2024-04-18 DIAGNOSIS — G43919 Migraine, unspecified, intractable, without status migrainosus: Secondary | ICD-10-CM | POA: Diagnosis not present

## 2024-04-18 DIAGNOSIS — K5792 Diverticulitis of intestine, part unspecified, without perforation or abscess without bleeding: Secondary | ICD-10-CM | POA: Diagnosis not present

## 2024-04-18 DIAGNOSIS — Z0000 Encounter for general adult medical examination without abnormal findings: Secondary | ICD-10-CM | POA: Diagnosis not present

## 2024-04-18 DIAGNOSIS — E039 Hypothyroidism, unspecified: Secondary | ICD-10-CM | POA: Diagnosis not present

## 2024-04-18 DIAGNOSIS — N390 Urinary tract infection, site not specified: Secondary | ICD-10-CM | POA: Diagnosis not present

## 2024-04-18 DIAGNOSIS — M5481 Occipital neuralgia: Secondary | ICD-10-CM | POA: Diagnosis not present

## 2024-04-18 DIAGNOSIS — Z1331 Encounter for screening for depression: Secondary | ICD-10-CM | POA: Diagnosis not present

## 2024-04-18 DIAGNOSIS — Z1339 Encounter for screening examination for other mental health and behavioral disorders: Secondary | ICD-10-CM | POA: Diagnosis not present

## 2024-05-02 ENCOUNTER — Other Ambulatory Visit: Payer: Self-pay | Admitting: Physician Assistant

## 2024-05-02 DIAGNOSIS — M8888 Osteitis deformans of other bones: Secondary | ICD-10-CM | POA: Diagnosis not present

## 2024-05-02 DIAGNOSIS — M419 Scoliosis, unspecified: Secondary | ICD-10-CM | POA: Diagnosis not present

## 2024-05-02 DIAGNOSIS — G8929 Other chronic pain: Secondary | ICD-10-CM | POA: Diagnosis not present

## 2024-05-02 DIAGNOSIS — M5416 Radiculopathy, lumbar region: Secondary | ICD-10-CM | POA: Diagnosis not present

## 2024-05-02 DIAGNOSIS — M25551 Pain in right hip: Secondary | ICD-10-CM | POA: Diagnosis not present

## 2024-05-02 DIAGNOSIS — M4316 Spondylolisthesis, lumbar region: Secondary | ICD-10-CM | POA: Diagnosis not present

## 2024-05-02 DIAGNOSIS — M48061 Spinal stenosis, lumbar region without neurogenic claudication: Secondary | ICD-10-CM

## 2024-05-03 ENCOUNTER — Encounter: Payer: Self-pay | Admitting: Physician Assistant

## 2024-05-10 ENCOUNTER — Ambulatory Visit
Admission: RE | Admit: 2024-05-10 | Discharge: 2024-05-10 | Disposition: A | Source: Ambulatory Visit | Attending: Physician Assistant | Admitting: Physician Assistant

## 2024-05-10 DIAGNOSIS — M5127 Other intervertebral disc displacement, lumbosacral region: Secondary | ICD-10-CM | POA: Diagnosis not present

## 2024-05-10 DIAGNOSIS — M48061 Spinal stenosis, lumbar region without neurogenic claudication: Secondary | ICD-10-CM

## 2024-05-10 DIAGNOSIS — M47817 Spondylosis without myelopathy or radiculopathy, lumbosacral region: Secondary | ICD-10-CM | POA: Diagnosis not present

## 2024-05-10 DIAGNOSIS — M4316 Spondylolisthesis, lumbar region: Secondary | ICD-10-CM | POA: Diagnosis not present

## 2024-05-10 DIAGNOSIS — M4807 Spinal stenosis, lumbosacral region: Secondary | ICD-10-CM | POA: Diagnosis not present

## 2024-05-19 DIAGNOSIS — U071 COVID-19: Secondary | ICD-10-CM | POA: Diagnosis not present

## 2024-05-19 DIAGNOSIS — J40 Bronchitis, not specified as acute or chronic: Secondary | ICD-10-CM | POA: Diagnosis not present

## 2024-05-19 DIAGNOSIS — J029 Acute pharyngitis, unspecified: Secondary | ICD-10-CM | POA: Diagnosis not present

## 2024-05-19 DIAGNOSIS — R112 Nausea with vomiting, unspecified: Secondary | ICD-10-CM | POA: Diagnosis not present

## 2024-05-19 DIAGNOSIS — R0602 Shortness of breath: Secondary | ICD-10-CM | POA: Diagnosis not present

## 2024-05-22 ENCOUNTER — Encounter (HOSPITAL_BASED_OUTPATIENT_CLINIC_OR_DEPARTMENT_OTHER): Payer: Self-pay

## 2024-05-22 ENCOUNTER — Emergency Department (HOSPITAL_BASED_OUTPATIENT_CLINIC_OR_DEPARTMENT_OTHER)
Admission: EM | Admit: 2024-05-22 | Discharge: 2024-05-22 | Disposition: A | Discharge status: Home / Self Care | Attending: Emergency Medicine | Admitting: Emergency Medicine

## 2024-05-22 ENCOUNTER — Other Ambulatory Visit: Payer: Self-pay

## 2024-05-22 DIAGNOSIS — J329 Chronic sinusitis, unspecified: Secondary | ICD-10-CM | POA: Insufficient documentation

## 2024-05-22 DIAGNOSIS — R0981 Nasal congestion: Secondary | ICD-10-CM | POA: Diagnosis present

## 2024-05-22 DIAGNOSIS — E039 Hypothyroidism, unspecified: Secondary | ICD-10-CM | POA: Diagnosis not present

## 2024-05-22 DIAGNOSIS — U071 COVID-19: Secondary | ICD-10-CM | POA: Diagnosis not present

## 2024-05-22 MED ORDER — NAPROXEN 500 MG PO TABS: 500.0000 mg | ORAL_TABLET | Freq: Two times a day (BID) | ORAL | 0 refills | Status: AC

## 2024-05-22 MED ORDER — AMOXICILLIN-POT CLAVULANATE 875-125 MG PO TABS: 1.0000 | ORAL_TABLET | Freq: Two times a day (BID) | ORAL | 0 refills | Status: AC

## 2024-05-22 NOTE — ED Provider Notes (Signed)
 [facility] Emergency Department Provider Note MRN:  [id number]  Arrival date & time: 05/22/24     Chief Complaint   Sinus pressure History of Present Illness   Alexa Rios is a 78 y.o. year-old female with a history of migraine presenting to the ED with chief complaint of sinus pressure.  Continued nasal congestion and sinus pressure, tastes and smells ammonia very strongly in the nose consistent with her prior sinus infections.  Tested positive for COVID about a week ago.  Not getting better, nasal congestion is causing headaches.  She has some mild sore throat, feels pressure in the ears.  Trouble sleeping due to all of these symptoms.  Review of Systems  A thorough review of systems was obtained and all systems are negative except as noted in the HPI and PMH.   Patient's Health History    Past Medical History:  Diagnosis Date   Allergy    Candida esophagitis (HCC)    Colon polyps    Diverticulitis    GERD (gastroesophageal reflux disease)    History of chicken pox    Hypothyroidism    Mesenteric adenitis    Migraine    Paget's disease of bony pelvis    Status post dilation of esophageal narrowing    Thyroid  disease     Past Surgical History:  Procedure Laterality Date   BREAST BIOPSY Left 1976   benign per patient   BREAST CYST ASPIRATION Right    30 + yrs ago   CHOLECYSTECTOMY  2015   COLONOSCOPY     KIDNEY CYST REMOVAL     REDUCTION MAMMAPLASTY Bilateral 2005   TONSILLECTOMY AND ADENOIDECTOMY  1951   VAGINAL HYSTERECTOMY  2000   benign path    Family History  Problem Relation Age of Onset   Rheum arthritis Mother    Hyperlipidemia Mother    Stroke Mother    Lymphoma Father    Diabetes Father    Early death Father    Hyperlipidemia Father    Cancer Brother        ureteral   Diabetes Brother    Hyperlipidemia Brother    Arthritis Sister    Hyperlipidemia Sister    Heart disease Maternal Grandmother    High  Cholesterol Maternal Grandmother    Heart disease Paternal Grandmother    High Cholesterol Paternal Grandmother    Prostate cancer Paternal Grandfather    Stroke Paternal Grandfather    Colon cancer Neg Hx    Pancreatic cancer Neg Hx    Esophageal cancer Neg Hx     Social History   Socioeconomic History   Marital status: Widowed    Spouse name: Not on file   Number of children: 1   Years of education: Not on file   Highest education level: Not on file  Occupational History   Occupation: retired  Tobacco Use   Smoking status: Never   Smokeless tobacco: Never  Vaping Use   Vaping status: Never Used  Substance and Sexual Activity   Alcohol  use: Not Currently   Drug use: Never   Sexual activity: Not Currently  Other Topics Concern   Not on file  Social History Narrative   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, 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   Moved to [place] from [place]  in May 2021   No alcohol    1 caffeinated beverage daily   Never smoker no drugs no other tobacco  Social Drivers of Corporate Investment Banker Strain: Low Risk  (05/02/2024)   Received from [facility]   Overall Financial Resource Strain (CARDIA)    Difficulty of Paying Living Expenses: Not hard at all  Food Insecurity: No Food Insecurity (04/29/2024)   Received from [facility]   Hunger Vital Sign    Within the past 12 months, you worried that your food would run out before you got the money to buy more.: Never true    Within the past 12 months, the food you bought just didn't last and you didn't have money to get more.: Never true  Transportation Needs: No Transportation Needs (04/29/2024)   Received from [facility] - Transportation    In the past 12 months, has lack of transportation kept you from medical appointments or from getting medications?: No    Lack of Transportation (Non-Medical): No  Physical Activity: Not on  file  Stress: Not on file  Social Connections: Unknown (11/30/2021)   Received from [facility]   Social Network    Social Network: Not on file  Intimate Partner Violence: Unknown (10/22/2021)   Received from [facility]   HITS    Physically Hurt: Not on file    Insult or Talk Down To: Not on file    Threaten Physical Harm: Not on file    Scream or Curse: Not on file     Physical Exam   Vitals:   05/22/24 0608  BP: 115/80  Pulse: 88  Resp: 18  Temp: (!) 97.5 F (36.4 C)  SpO2: 100%    CONSTITUTIONAL: Well-appearing, NAD NEURO/PSYCH:  Alert and oriented x 3, no focal deficits EYES:  eyes equal and reactive ENT/NECK:  no LAD, no JVD CARDIO: Regular rate, well-perfused, normal S1 and S2 PULM:  CTAB no wheezing or rhonchi GI/GU:  non-distended, non-tender MSK/SPINE:  No gross deformities, no edema SKIN:  no rash, atraumatic   *Additional and/or pertinent findings included in MDM below  Diagnostic and Interventional Summary    EKG Interpretation Date/Time:    Ventricular Rate:    PR Interval:    QRS Duration:    QT Interval:    QTC Calculation:   R Axis:      Text Interpretation:         Labs Reviewed - No data to display  No orders to display    Medications - No data to display   Procedures  /  Critical Care Procedures  ED Course and Medical Decision Making  Initial Impression and Ddx Well-appearing in no acute distress with normal vital signs, no hypoxia, no increased work of breathing, lungs are clear.  Describing pressure and sinusitis.  TMs appear normal.  Azithromycin not helping at home, most likely because her symptoms are due to COVID-19.  Past medical/surgical history that increases complexity of ED encounter: None  Interpretation of Diagnostics Laboratory and/or imaging options to aid in the diagnosis/care of the patient were considered.  After careful history and physical examination, it was determined that there was no indication for  diagnostics at this time.  Patient Reassessment and Ultimate Disposition/Management     Discharged with reassurance, symptomatic management at home.  Patient management required discussion with the following services or consulting groups:  None  Complexity of Problems Addressed Acute complicated illness or Injury  Additional Data Reviewed and Analyzed Further history obtained from: None  Additional Factors Impacting ED Encounter Risk Prescriptions  Ozell HERO. Theadore, MD [facility] Emergency Medicine [facility] [email or web address]  Final Clinical Impressions(s) / ED Diagnoses     ICD-10-CM   1. COVID-19  U07.1     2. Sinusitis, unspecified chronicity, unspecified location  J32.9       ED Discharge Orders          Ordered    amoxicillin -clavulanate (AUGMENTIN ) 875-125 MG tablet  Every 12 hours        05/22/24 0650    naproxen (NAPROSYN) 500 MG tablet  2 times daily        05/22/24 9349             Discharge Instructions Discussed with and Provided to Patient:    Discharge Instructions      You were evaluated in the Emergency Department and after careful evaluation, we did not find any emergent condition requiring admission or further testing in the hospital.  Your exam/testing today is overall reassuring.  Symptoms are likely explained by COVID-19 infection.  Take the Naprosyn anti-inflammatory twice daily for discomfort.  Also recommend Tylenol  1000 mg every 4-6 hours.  Continue your home Zofran  as needed.  If you are not experiencing improvement over the next 2 or 3 days you can start the Augmentin  antibiotic to treat for possible bacterial sinus infection.  Please return to the Emergency Department if you experience any worsening of your condition.   Thank you for allowing us  to be a part of your care.      Theadore Ozell HERO, MD 05/22/24 [phone]

## 2024-05-22 NOTE — ED Triage Notes (Addendum)
 Arrives POV. Reports having COVID last week. Now has sinus congestion, L ear pain and a headache. Denies fever, N/V. Went to [facility] Saturday and was prescribed zpack which hasn't helped. Denies taking any medications before coming in this morning.

## 2024-05-22 NOTE — Discharge Instructions (Signed)
 You were evaluated in the Emergency Department and after careful evaluation, we did not find any emergent condition requiring admission or further testing in the hospital.  Your exam/testing today is overall reassuring.  Symptoms are likely explained by COVID-19 infection.  Take the Naprosyn anti-inflammatory twice daily for discomfort.  Also recommend Tylenol  1000 mg every 4-6 hours.  Continue your home Zofran  as needed.  If you are not experiencing improvement over the next 2 or 3 days you can start the Augmentin  antibiotic to treat for possible bacterial sinus infection.  Please return to the Emergency Department if you experience any worsening of your condition.   Thank you for allowing us  to be a part of your care.

## 2024-06-05 DIAGNOSIS — R1032 Left lower quadrant pain: Secondary | ICD-10-CM | POA: Diagnosis not present

## 2024-06-05 DIAGNOSIS — K5792 Diverticulitis of intestine, part unspecified, without perforation or abscess without bleeding: Secondary | ICD-10-CM | POA: Diagnosis not present

## 2024-06-05 DIAGNOSIS — R197 Diarrhea, unspecified: Secondary | ICD-10-CM | POA: Diagnosis not present

## 2024-06-06 DIAGNOSIS — R197 Diarrhea, unspecified: Secondary | ICD-10-CM | POA: Diagnosis not present

## 2024-06-06 DIAGNOSIS — K5730 Diverticulosis of large intestine without perforation or abscess without bleeding: Secondary | ICD-10-CM | POA: Diagnosis not present

## 2024-06-06 DIAGNOSIS — R1032 Left lower quadrant pain: Secondary | ICD-10-CM | POA: Diagnosis not present

## 2024-06-15 DIAGNOSIS — Z8619 Personal history of other infectious and parasitic diseases: Secondary | ICD-10-CM | POA: Diagnosis not present

## 2024-06-15 DIAGNOSIS — N390 Urinary tract infection, site not specified: Secondary | ICD-10-CM | POA: Diagnosis not present

## 2024-06-15 DIAGNOSIS — R030 Elevated blood-pressure reading, without diagnosis of hypertension: Secondary | ICD-10-CM | POA: Diagnosis not present

## 2024-06-15 DIAGNOSIS — R319 Hematuria, unspecified: Secondary | ICD-10-CM | POA: Diagnosis not present

## 2024-06-15 DIAGNOSIS — R300 Dysuria: Secondary | ICD-10-CM | POA: Diagnosis not present

## 2024-06-18 ENCOUNTER — Other Ambulatory Visit: Payer: Self-pay | Admitting: Internal Medicine

## 2024-06-18 DIAGNOSIS — Z1231 Encounter for screening mammogram for malignant neoplasm of breast: Secondary | ICD-10-CM

## 2024-06-21 DIAGNOSIS — K5909 Other constipation: Secondary | ICD-10-CM | POA: Diagnosis not present

## 2024-06-21 DIAGNOSIS — G901 Familial dysautonomia [Riley-Day]: Secondary | ICD-10-CM | POA: Diagnosis not present

## 2024-06-21 DIAGNOSIS — U071 COVID-19: Secondary | ICD-10-CM | POA: Diagnosis not present

## 2024-06-21 DIAGNOSIS — M2060 Acquired deformities of toe(s), unspecified, unspecified foot: Secondary | ICD-10-CM | POA: Diagnosis not present

## 2024-06-21 DIAGNOSIS — R5383 Other fatigue: Secondary | ICD-10-CM | POA: Diagnosis not present

## 2024-06-21 DIAGNOSIS — K5730 Diverticulosis of large intestine without perforation or abscess without bleeding: Secondary | ICD-10-CM | POA: Diagnosis not present

## 2024-06-21 DIAGNOSIS — I872 Venous insufficiency (chronic) (peripheral): Secondary | ICD-10-CM | POA: Diagnosis not present

## 2024-06-21 DIAGNOSIS — M79672 Pain in left foot: Secondary | ICD-10-CM | POA: Diagnosis not present

## 2024-06-21 DIAGNOSIS — G43109 Migraine with aura, not intractable, without status migrainosus: Secondary | ICD-10-CM | POA: Diagnosis not present

## 2024-06-28 NOTE — Telephone Encounter (Signed)
 See portal message. What do you advise?

## 2024-06-28 NOTE — Telephone Encounter (Signed)
 Spoke with the pt. She will submit stools studies ASAP. She will await results.

## 2024-06-28 NOTE — Telephone Encounter (Signed)
 I can place another order for stool studies to see if now the C diff is active and we need to treat it.   That is fine to see Alexa Rios if there is availability but may be a telephone visit until we rule out C diff.

## 2024-06-28 NOTE — Telephone Encounter (Signed)
 Patient is calling to scheduled an appointment with a PA. Patient stated she is having watery diarrhea Please advise is she can be schedule with a PA at [facility] [phone]

## 2024-07-30 ENCOUNTER — Ambulatory Visit
Admission: RE | Admit: 2024-07-30 | Discharge: 2024-07-30 | Disposition: A | Source: Ambulatory Visit | Attending: Internal Medicine | Admitting: Internal Medicine

## 2024-07-30 DIAGNOSIS — Z1231 Encounter for screening mammogram for malignant neoplasm of breast: Secondary | ICD-10-CM
